# Patient Record
Sex: Female | Born: 1960 | Race: White | Hispanic: No | Smoking: Current every day smoker
Health system: Southern US, Community
[De-identification: ages and names within clinical notes are randomized; demographics above are authoritative.]

## PROBLEM LIST (undated history)

## (undated) DIAGNOSIS — E1140 Type 2 diabetes mellitus with diabetic neuropathy, unspecified: Secondary | ICD-10-CM

## (undated) DIAGNOSIS — R51 Headache: Secondary | ICD-10-CM

## (undated) DIAGNOSIS — M1990 Unspecified osteoarthritis, unspecified site: Secondary | ICD-10-CM

## (undated) DIAGNOSIS — J449 Chronic obstructive pulmonary disease, unspecified: Secondary | ICD-10-CM

## (undated) DIAGNOSIS — F329 Major depressive disorder, single episode, unspecified: Secondary | ICD-10-CM

## (undated) DIAGNOSIS — G4730 Sleep apnea, unspecified: Secondary | ICD-10-CM

## (undated) DIAGNOSIS — K219 Gastro-esophageal reflux disease without esophagitis: Secondary | ICD-10-CM

## (undated) DIAGNOSIS — I2510 Atherosclerotic heart disease of native coronary artery without angina pectoris: Secondary | ICD-10-CM

## (undated) DIAGNOSIS — F419 Anxiety disorder, unspecified: Secondary | ICD-10-CM

## (undated) DIAGNOSIS — R0600 Dyspnea, unspecified: Secondary | ICD-10-CM

## (undated) DIAGNOSIS — F17210 Nicotine dependence, cigarettes, uncomplicated: Secondary | ICD-10-CM

## (undated) DIAGNOSIS — W5911XA Bitten by nonvenomous snake, initial encounter: Secondary | ICD-10-CM

## (undated) DIAGNOSIS — I700 Atherosclerosis of aorta: Secondary | ICD-10-CM

## (undated) DIAGNOSIS — G510 Bell's palsy: Secondary | ICD-10-CM

## (undated) DIAGNOSIS — M549 Dorsalgia, unspecified: Secondary | ICD-10-CM

## (undated) DIAGNOSIS — J189 Pneumonia, unspecified organism: Secondary | ICD-10-CM

## (undated) DIAGNOSIS — J45909 Unspecified asthma, uncomplicated: Secondary | ICD-10-CM

## (undated) DIAGNOSIS — I499 Cardiac arrhythmia, unspecified: Secondary | ICD-10-CM

## (undated) DIAGNOSIS — G2581 Restless legs syndrome: Secondary | ICD-10-CM

## (undated) DIAGNOSIS — D3002 Benign neoplasm of left kidney: Secondary | ICD-10-CM

## (undated) DIAGNOSIS — J84115 Respiratory bronchiolitis interstitial lung disease: Secondary | ICD-10-CM

## (undated) DIAGNOSIS — R519 Headache, unspecified: Secondary | ICD-10-CM

## (undated) DIAGNOSIS — F32A Depression, unspecified: Secondary | ICD-10-CM

## (undated) DIAGNOSIS — E119 Type 2 diabetes mellitus without complications: Secondary | ICD-10-CM

## (undated) DIAGNOSIS — B1920 Unspecified viral hepatitis C without hepatic coma: Secondary | ICD-10-CM

## (undated) DIAGNOSIS — I10 Essential (primary) hypertension: Secondary | ICD-10-CM

## (undated) DIAGNOSIS — K08109 Complete loss of teeth, unspecified cause, unspecified class: Secondary | ICD-10-CM

## (undated) DIAGNOSIS — F319 Bipolar disorder, unspecified: Secondary | ICD-10-CM

## (undated) DIAGNOSIS — F411 Generalized anxiety disorder: Secondary | ICD-10-CM

## (undated) DIAGNOSIS — Z8489 Family history of other specified conditions: Secondary | ICD-10-CM

## (undated) HISTORY — PX: TUBAL LIGATION: SHX77

## (undated) HISTORY — PX: TUMOR REMOVAL: SHX12

---

## 2005-07-05 ENCOUNTER — Emergency Department: Payer: Self-pay | Admitting: Emergency Medicine

## 2005-11-27 ENCOUNTER — Other Ambulatory Visit: Payer: Self-pay

## 2005-11-27 ENCOUNTER — Emergency Department: Payer: Self-pay | Admitting: Emergency Medicine

## 2005-11-27 IMAGING — CR DG CHEST 2V
1 series · 2 of 2 positions shown · non-contrast
Comparison: none

REASON FOR EXAM: Cough
COMMENTS:

PROCEDURE:     DXR - DXR CHEST PA (OR AP) AND LATERAL  - [DATE]  [DATE]
RESULT:     The lung fields are clear. The heart, mediastinal and osseous
structures show no significant abnormalities.

[Series 1: view not recorded · 0.17mm/px · 2 of 2 slices shown]
[im 1/2]
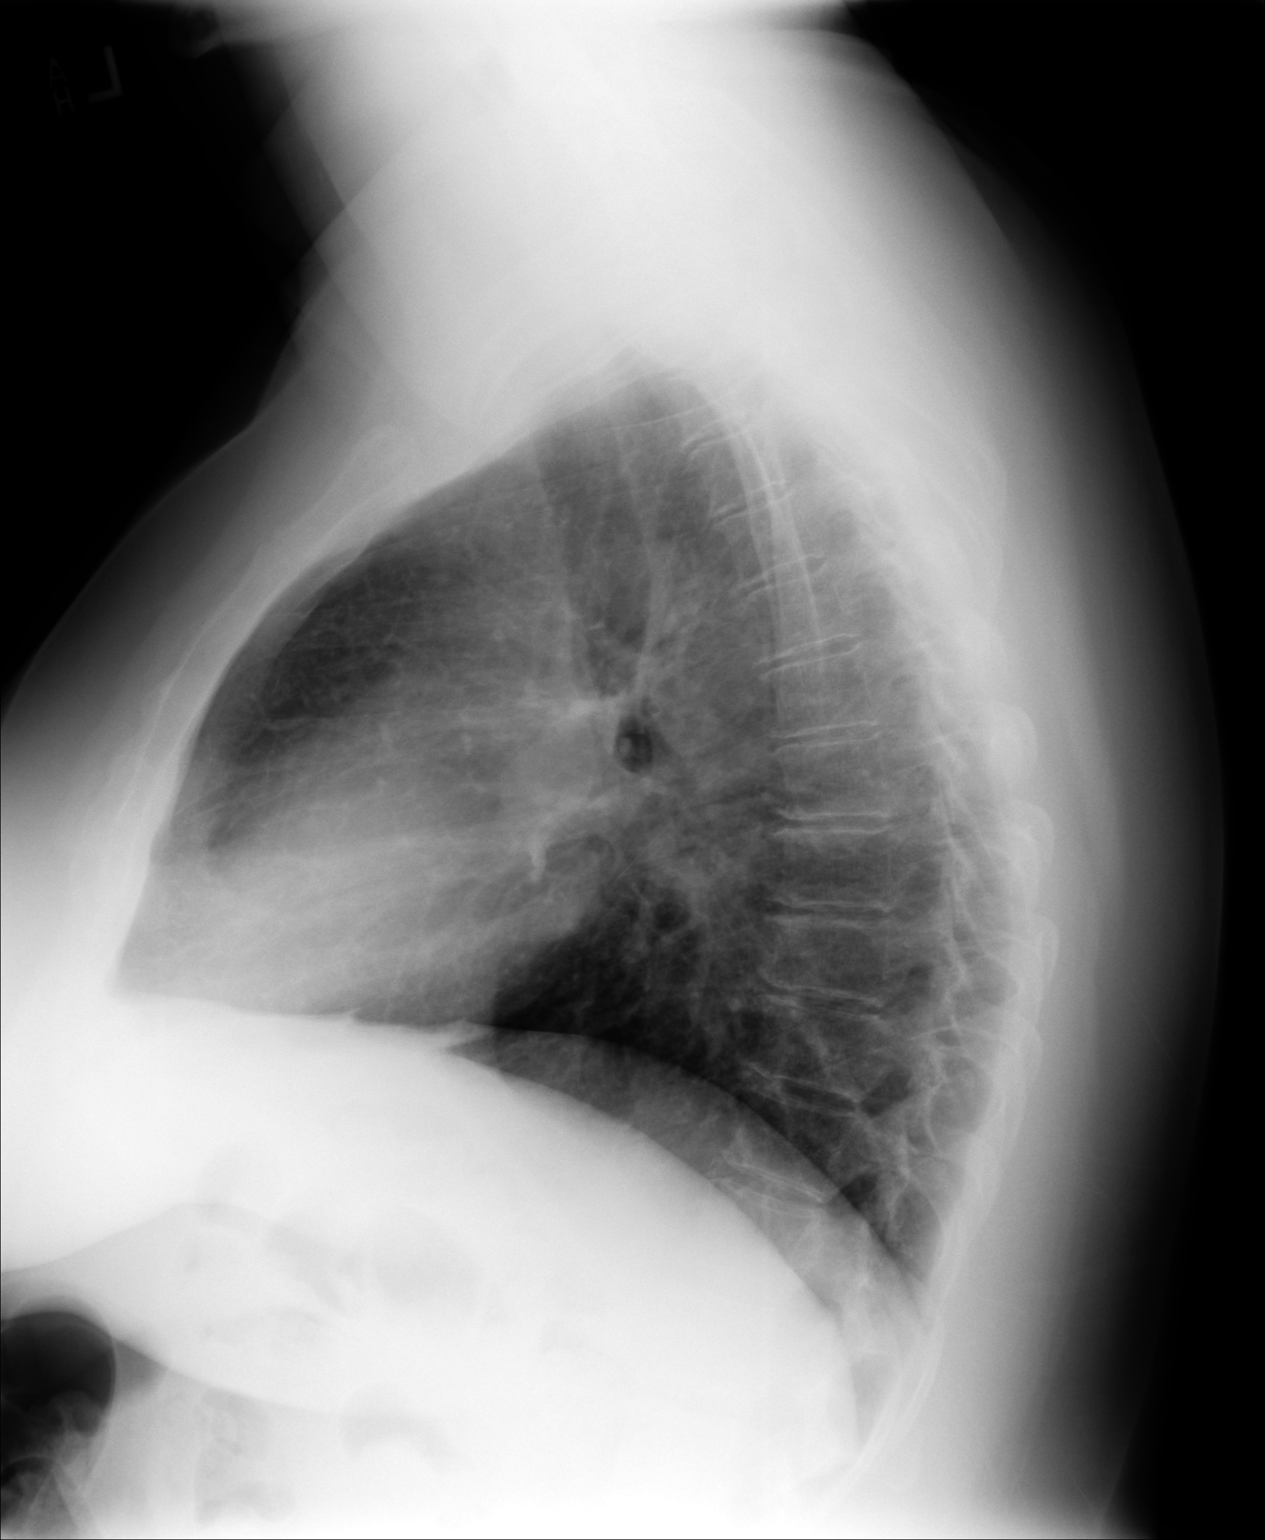
[im 2/2]
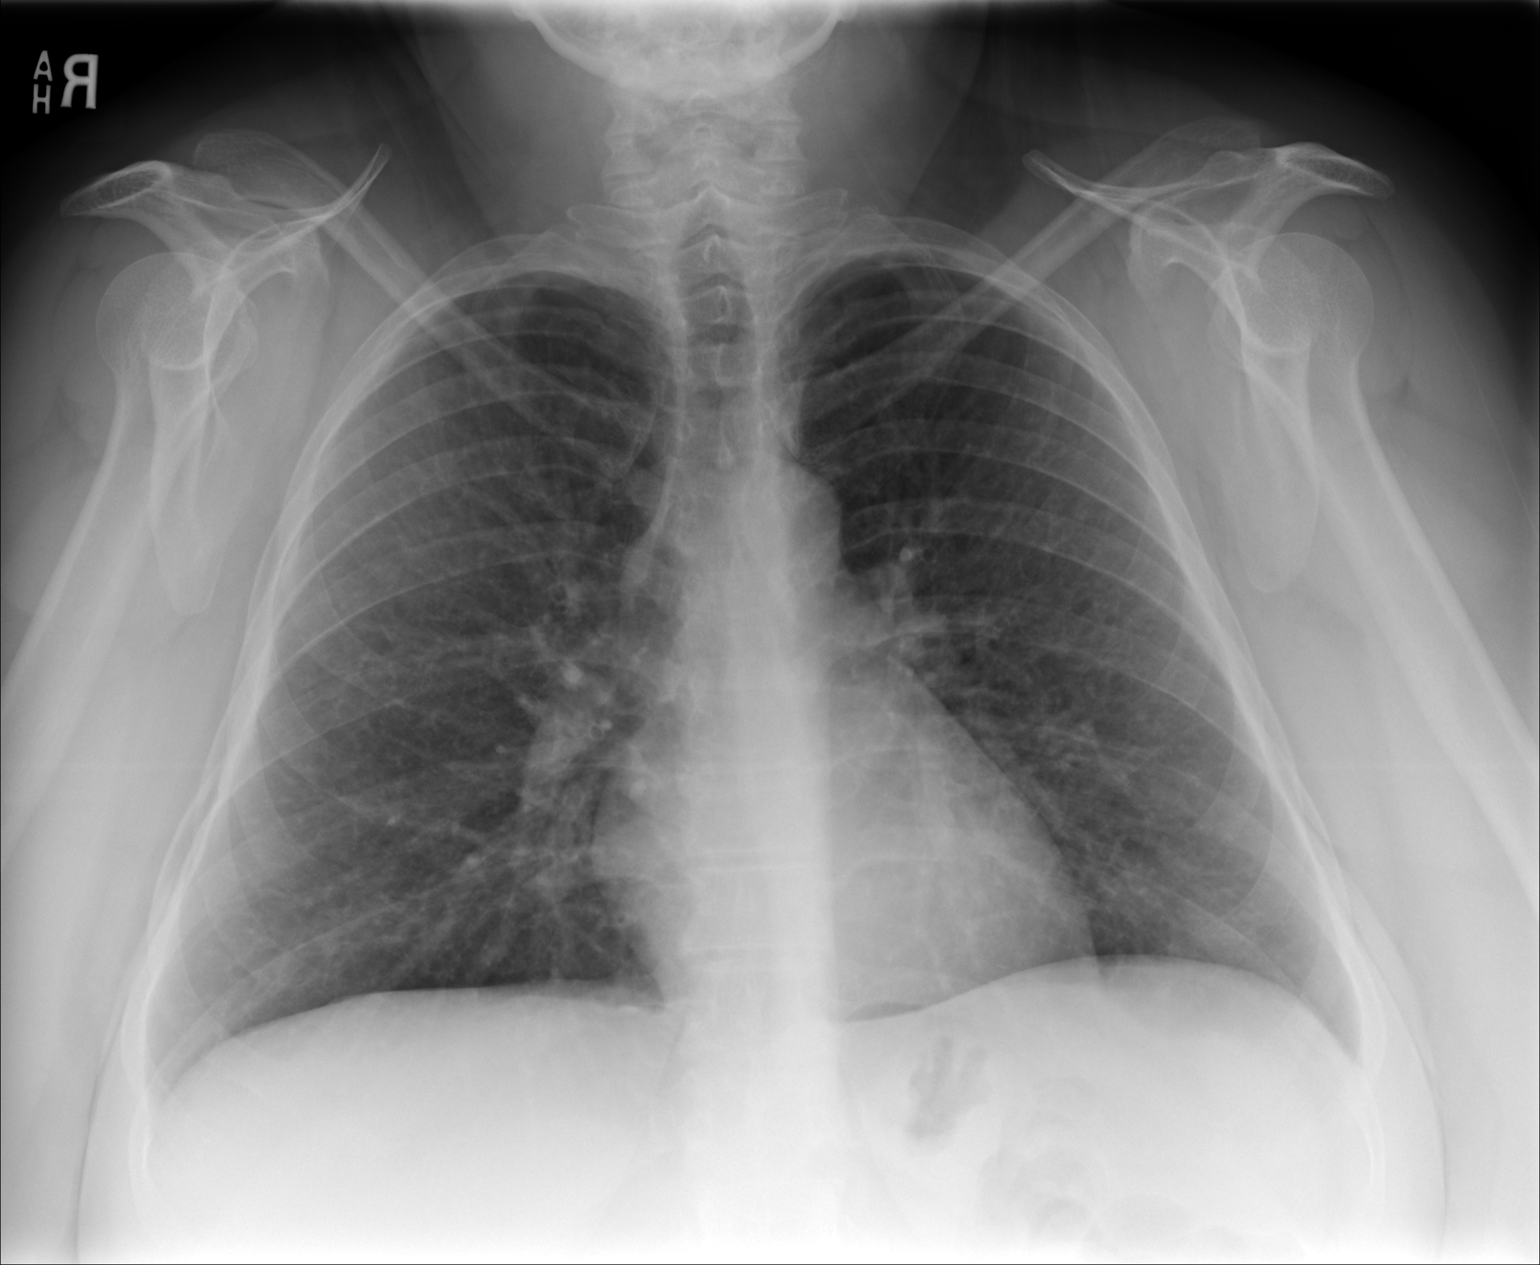

[2 of 2 positions shown; findings below may reference images not displayed]

IMPRESSION: No significant abnormalities are noted.

## 2006-03-18 ENCOUNTER — Emergency Department: Payer: Self-pay | Admitting: Emergency Medicine

## 2006-03-18 IMAGING — CT CT HEAD WITHOUT CONTRAST
2 series · 16 of 30 positions shown, 20 images · non-contrast
Comparison: none

REASON FOR EXAM: Headache
COMMENTS:

[Series 2: without · axial · non-contrast · 0.38mm/px · z∈[+798,+918]mm · 13 of 30 slices shown, 17 images]
[im 3/30  brain]
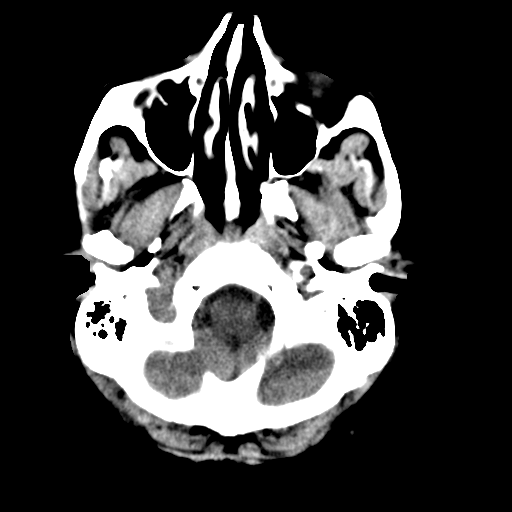
[im 3/30  bone]
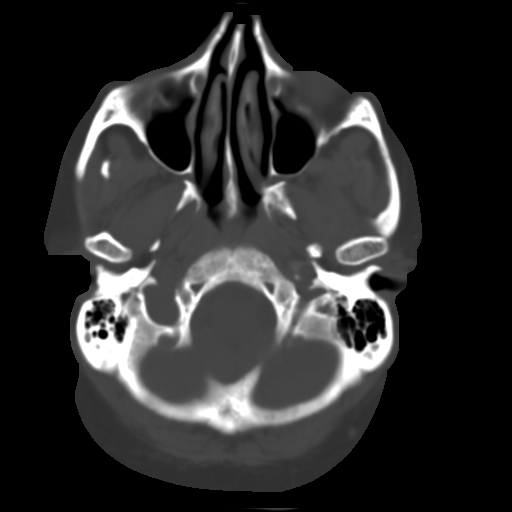
[im 5/30  brain]
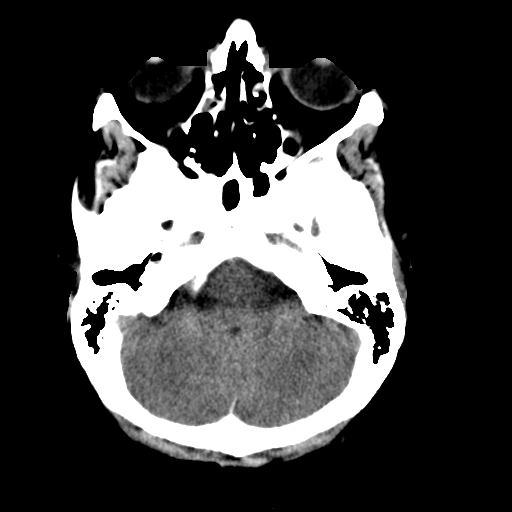
[im 7/30  brain]
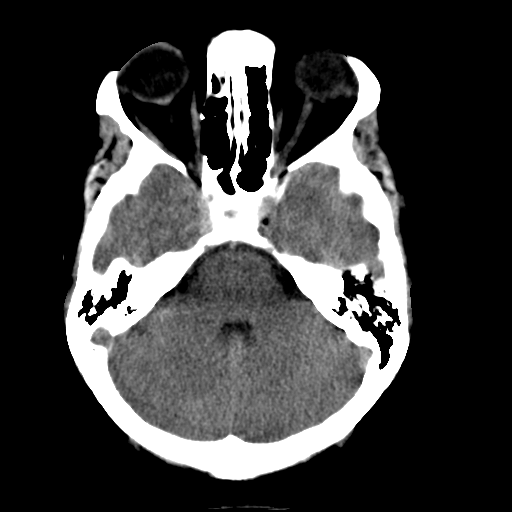
[im 9/30  brain]
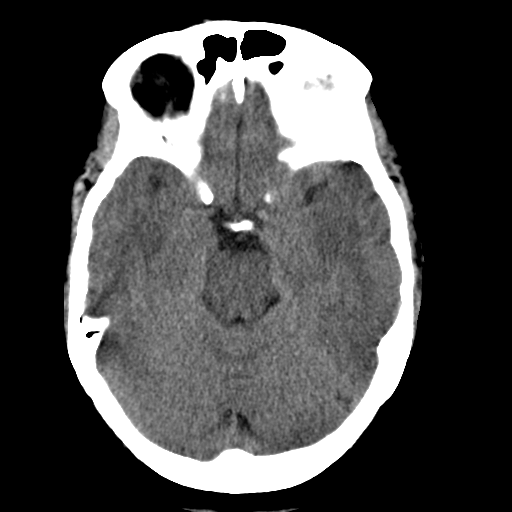
[im 11/30  brain]
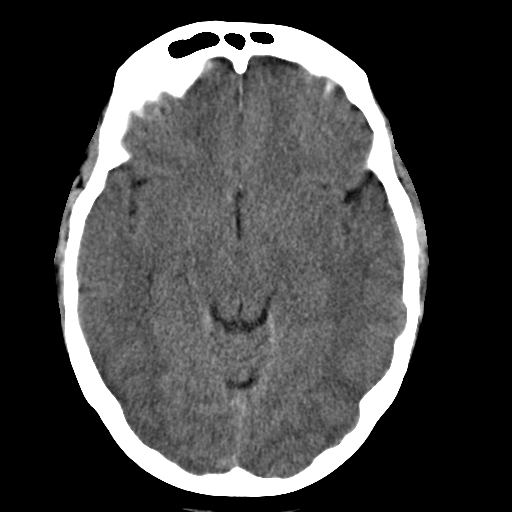
[im 11/30  bone]
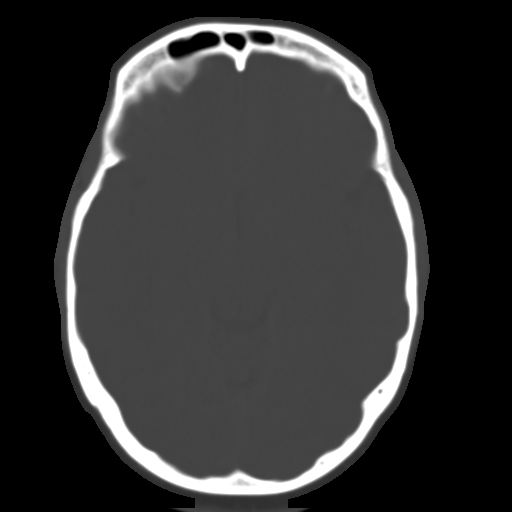
[im 13/30  brain]
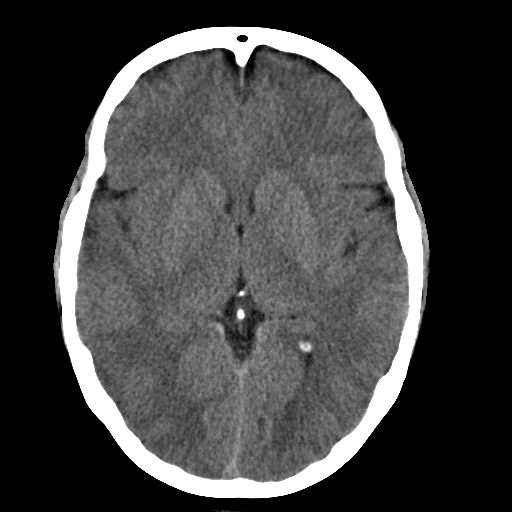
[im 15/30  brain]
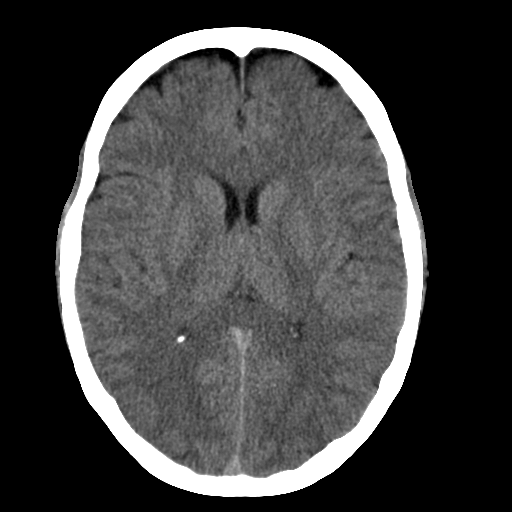
[im 17/30  brain]
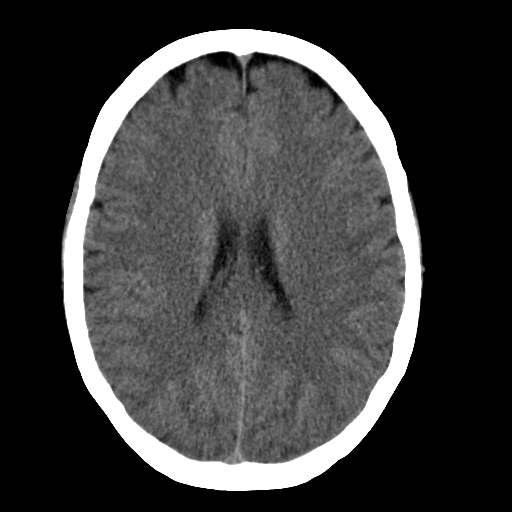
[im 19/30  brain]
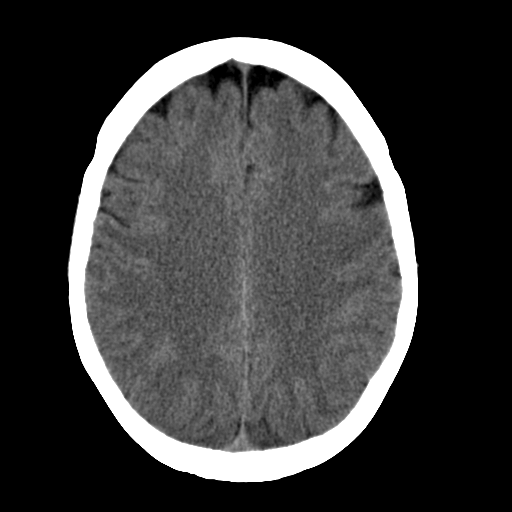
[im 19/30  bone]
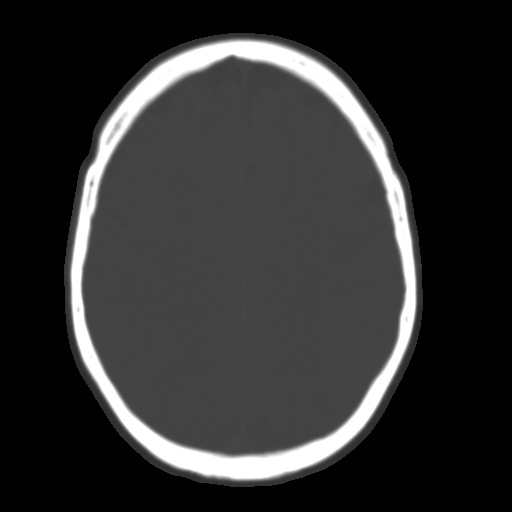
[im 21/30  brain]
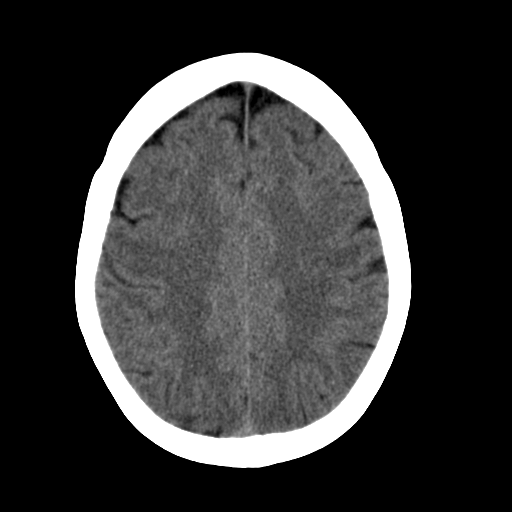
[im 23/30  brain]
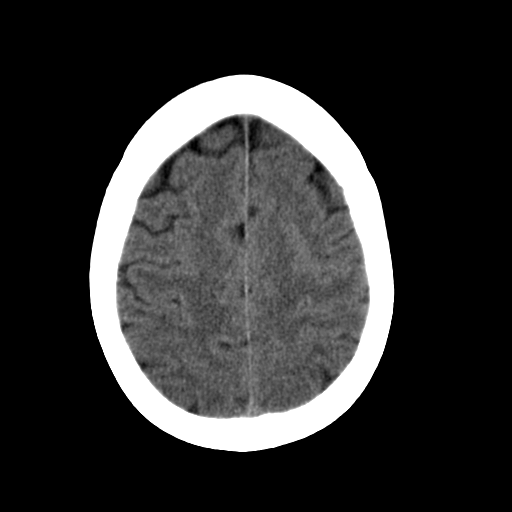
[im 25/30  brain]
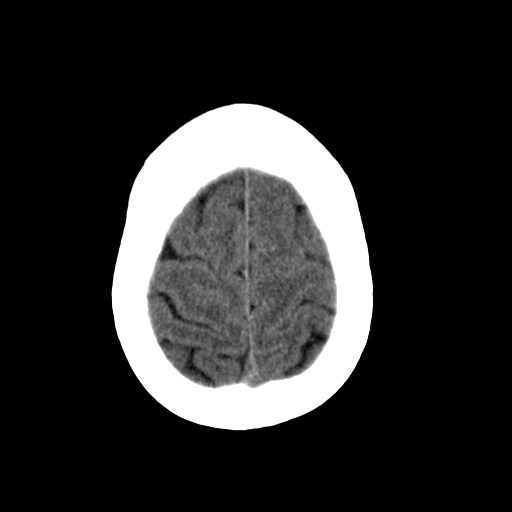
[im 27/30  brain]
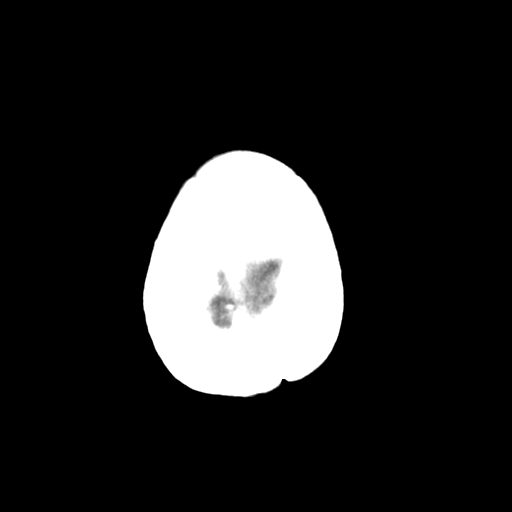
[im 27/30  bone]
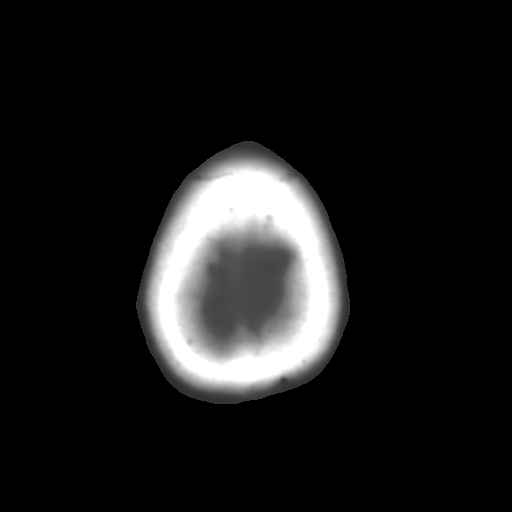

[Series 3: bone · axial · 0.38mm/px · z∈[+798,+838]mm · 3 of 30 slices shown]
[im 3/30  bone]
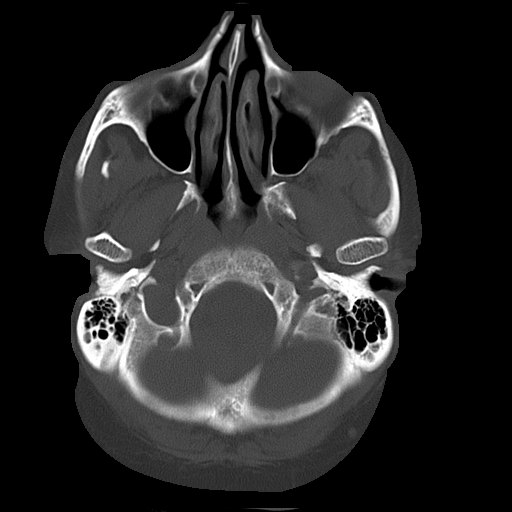
[im 7/30  bone]
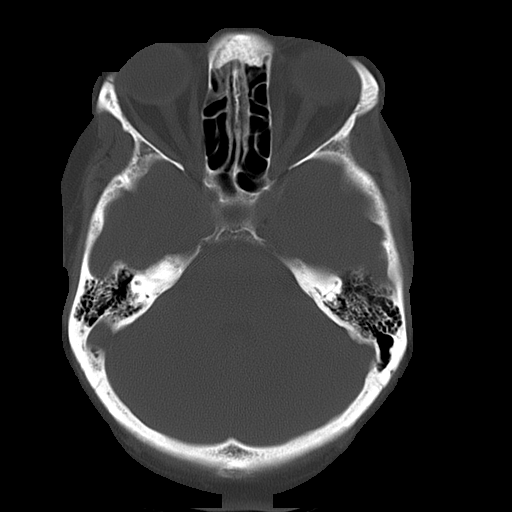
[im 11/30  bone]
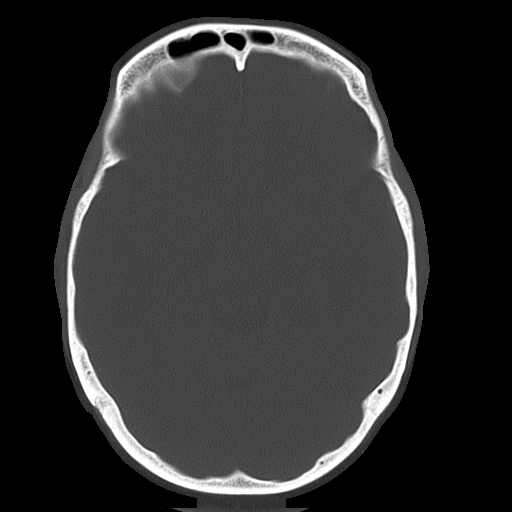

[16 of 30 positions shown; findings below may reference images not displayed]

PROCEDURE:     CT  - CT HEAD WITHOUT CONTRAST  - [DATE]  [DATE]

RESULT:     The ventricles are normal in size and position. There is no
evidence of a mass nor of mass effect. There is no intracranial hemorrhage.
At bone window settings, the visualized portions of the paranasal sinuses
are normal. There is no evidence of skull fracture.
IMPRESSION: I see no acute intracranial abnormality.

The findings were called to the Emergency Room at the conclusion of the
study.

## 2008-03-09 ENCOUNTER — Emergency Department: Payer: Self-pay | Admitting: Emergency Medicine

## 2008-03-09 IMAGING — CR DG ELBOW COMPLETE 3+V*L*
1 series · 4 of 4 positions shown · non-contrast
Comparison: none

REASON FOR EXAM: fall/injury    Minor care 4
COMMENTS:   LMP: Post-Menopausal

PROCEDURE:     DXR - DXR ELBOW LT COMP W/OBLIQUES  - [DATE] [DATE]
RESULT:     Images of the LEFT elbow demonstrate no definite fracture,
dislocation or radiopaque foreign body.

[Series 1: view not recorded · 0.17mm/px · 4 of 4 slices shown]
[im 1/4]
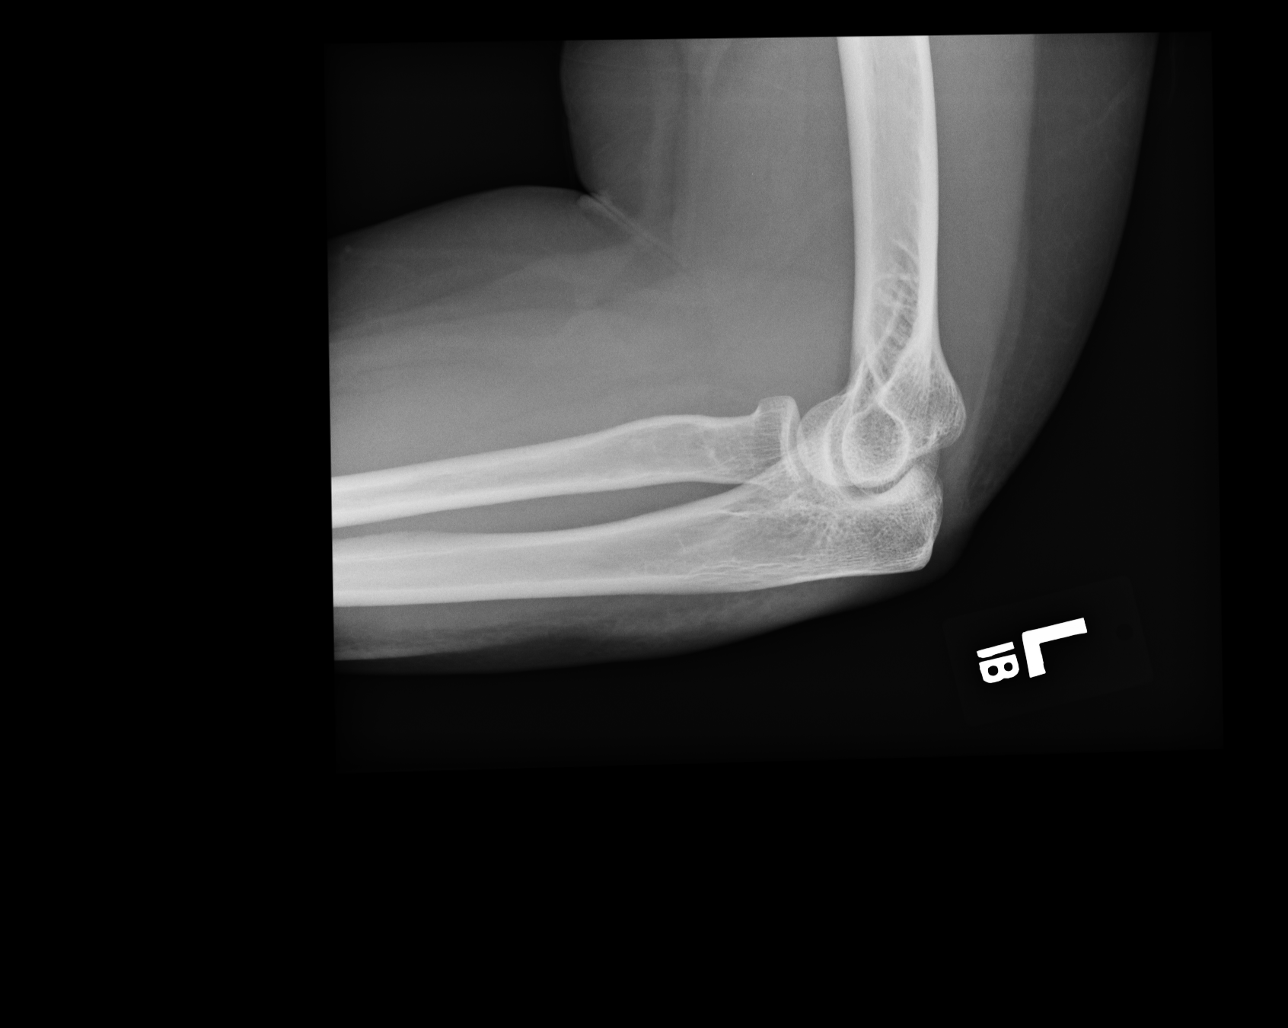
[im 2/4]
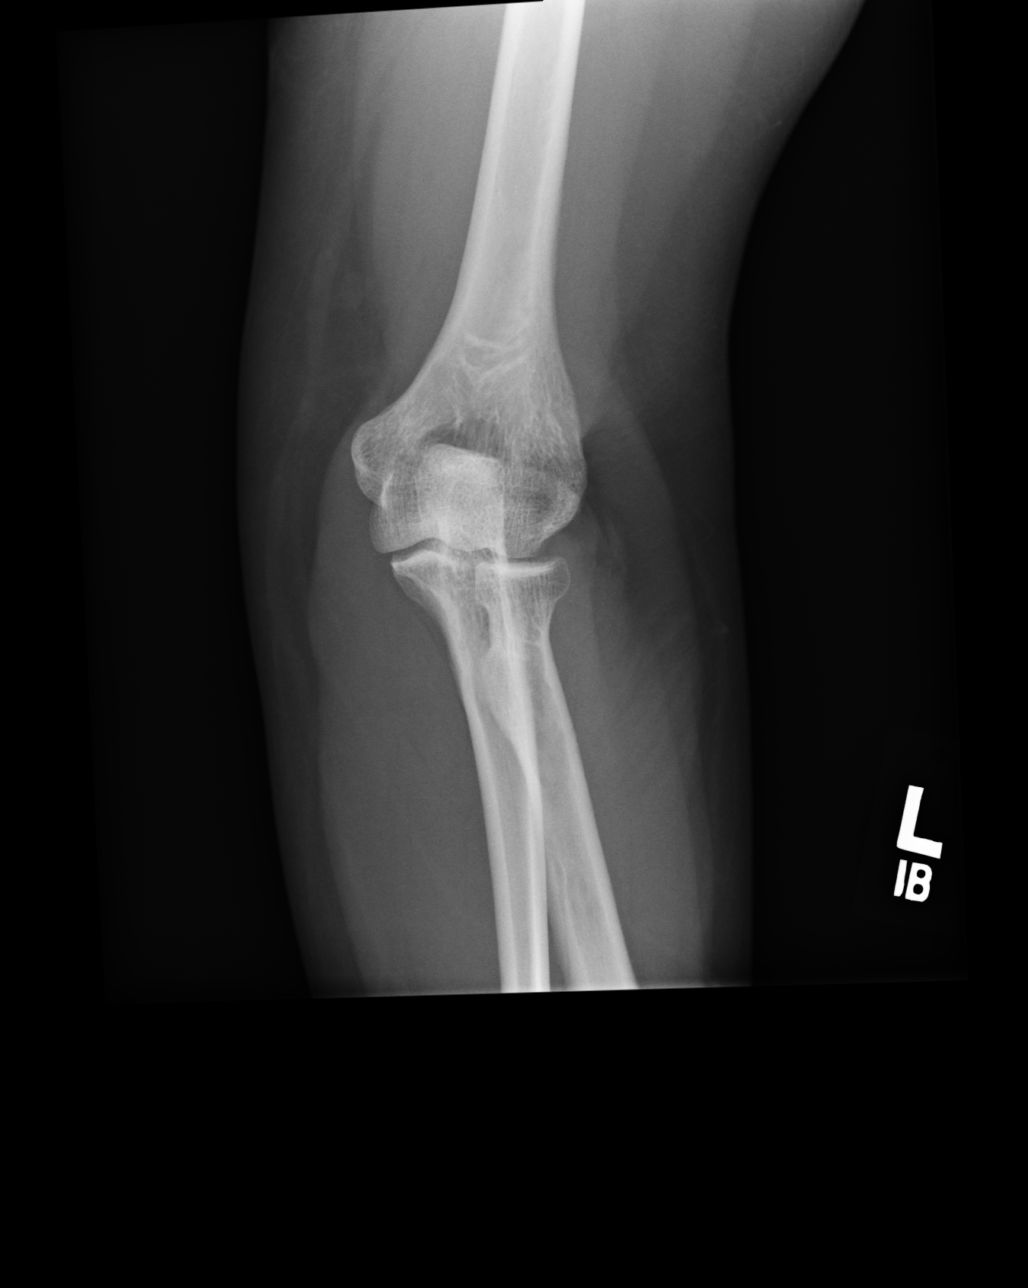
[im 3/4]
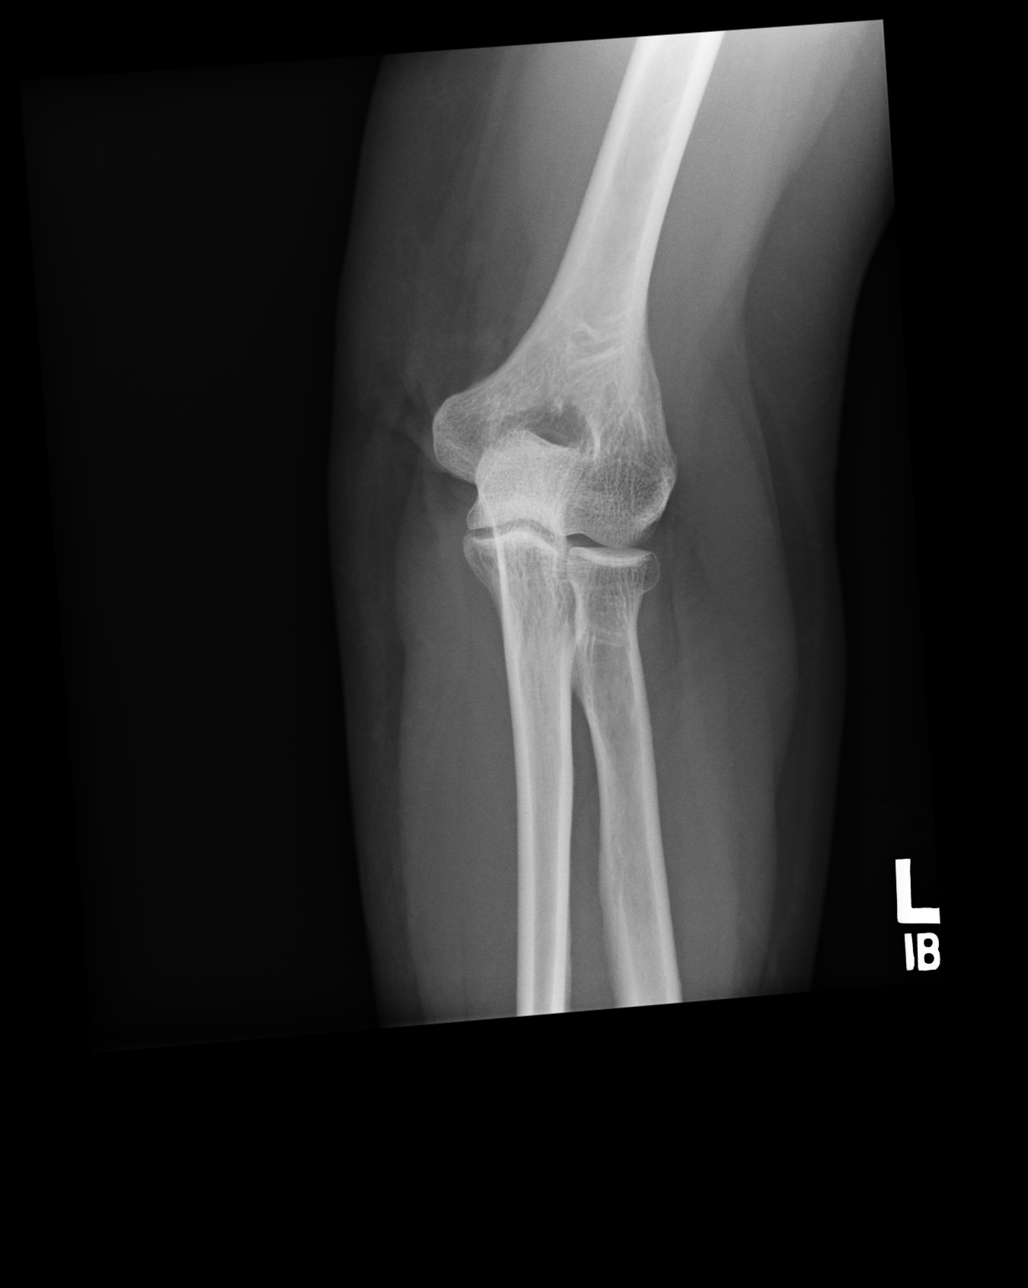
[im 4/4]
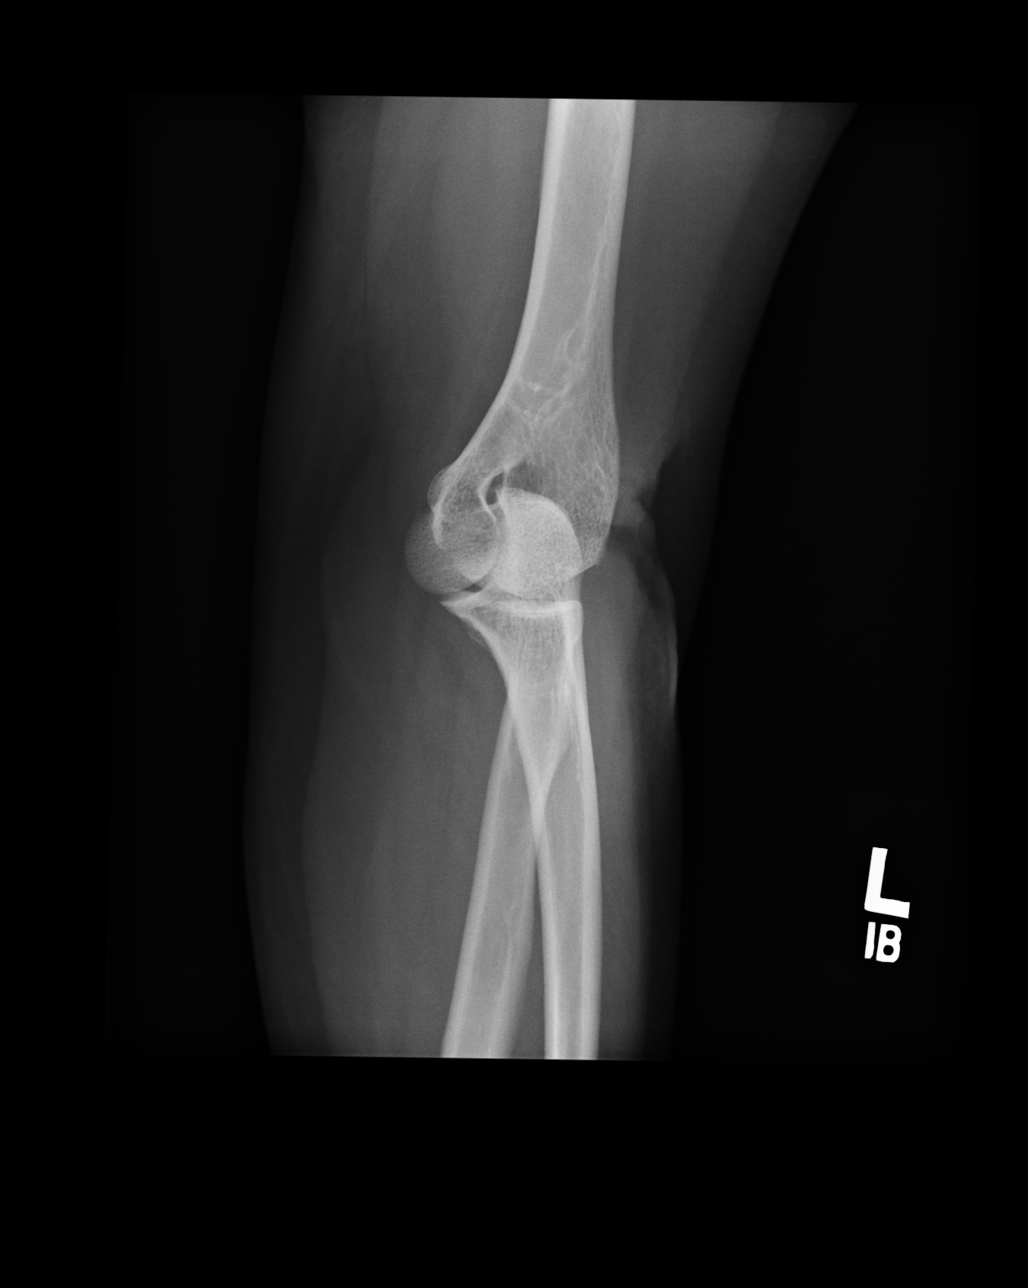

[4 of 4 positions shown; findings below may reference images not displayed]

IMPRESSION: No acute bony abnormality evident.

## 2009-06-27 ENCOUNTER — Emergency Department: Payer: Self-pay | Admitting: Internal Medicine

## 2009-07-07 ENCOUNTER — Emergency Department: Payer: Self-pay | Admitting: Emergency Medicine

## 2009-08-07 ENCOUNTER — Emergency Department: Payer: Self-pay | Admitting: Emergency Medicine

## 2009-12-27 ENCOUNTER — Emergency Department: Payer: Self-pay | Admitting: Emergency Medicine

## 2010-06-10 ENCOUNTER — Ambulatory Visit: Payer: Self-pay | Admitting: Family Medicine

## 2010-06-10 IMAGING — US ABDOMEN ULTRASOUND LIMITED
1 series · 17 of 25 positions shown · non-contrast
Comparison: none

REASON FOR EXAM: ACUTE RUQ PAIN EVAL FOR LIVER GALLBLADDER AND PANCREAS
COMMENTS:

[Series 1: abdomen ultrasound limited · 17 of 62 slices shown]
[im 1/62]
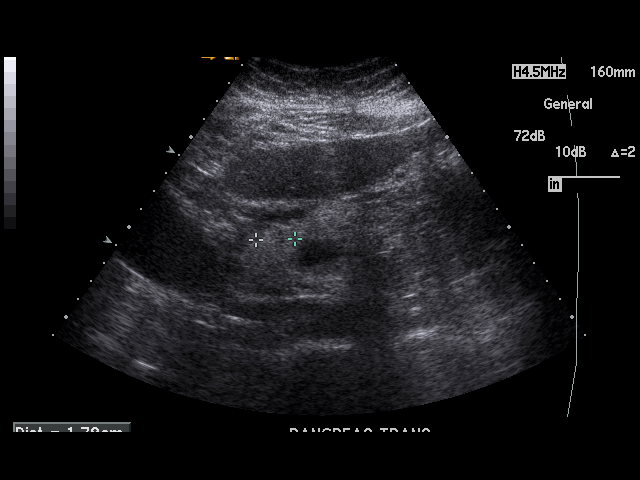
[im 6/62]
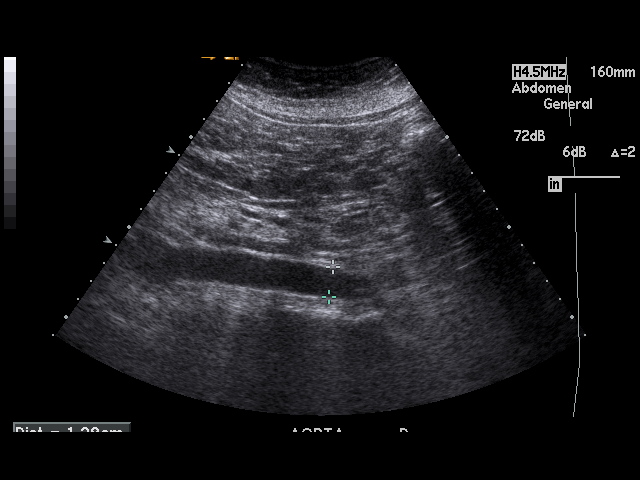
[im 8/62]
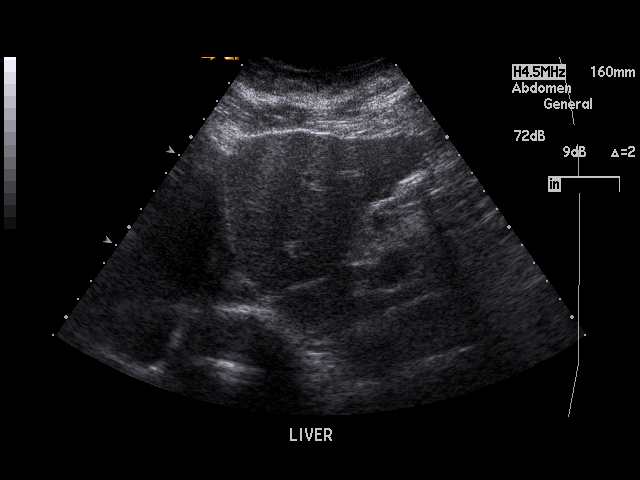
[im 13/62]
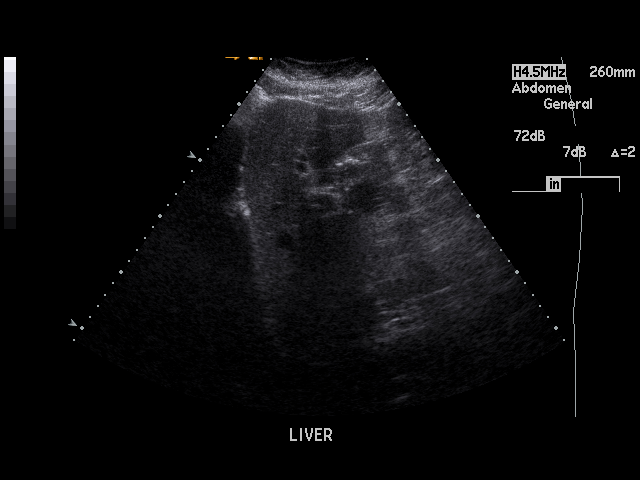
[im 16/62]
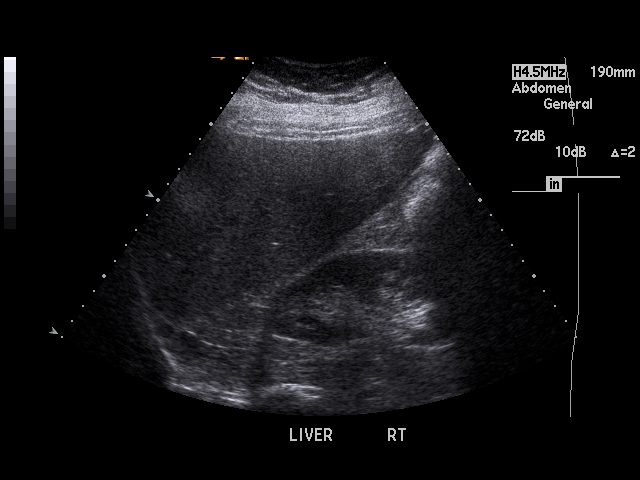
[im 21/62]
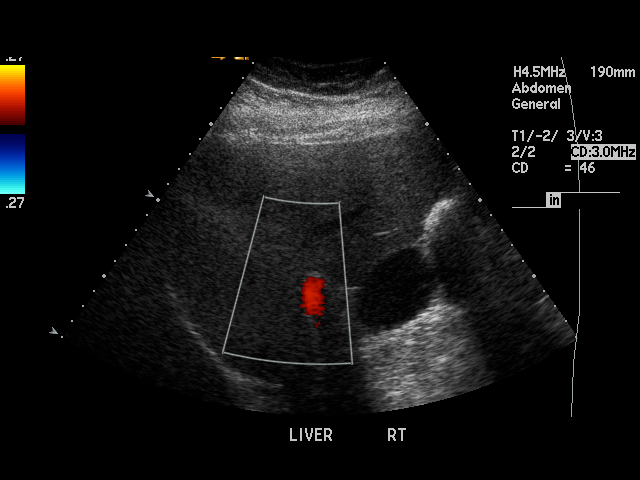
[im 23/62]
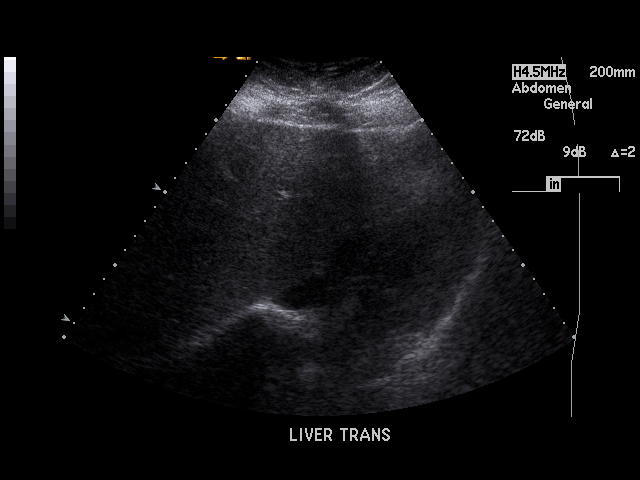
[im 28/62]
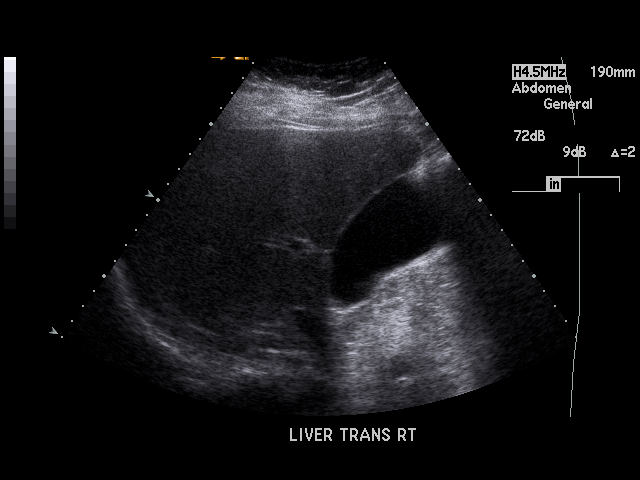
[im 31/62]
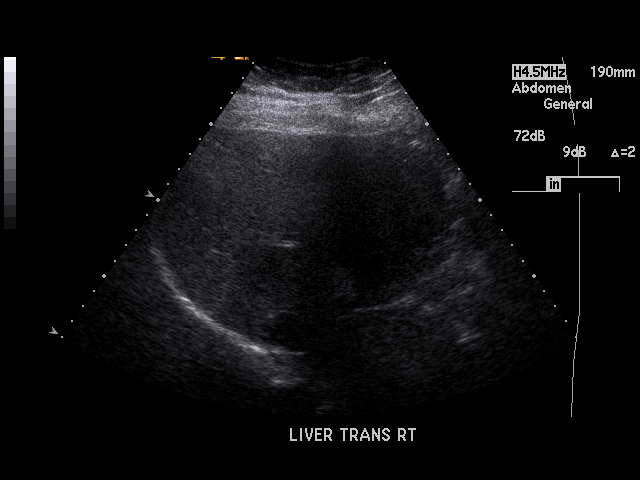
[im 34/62]
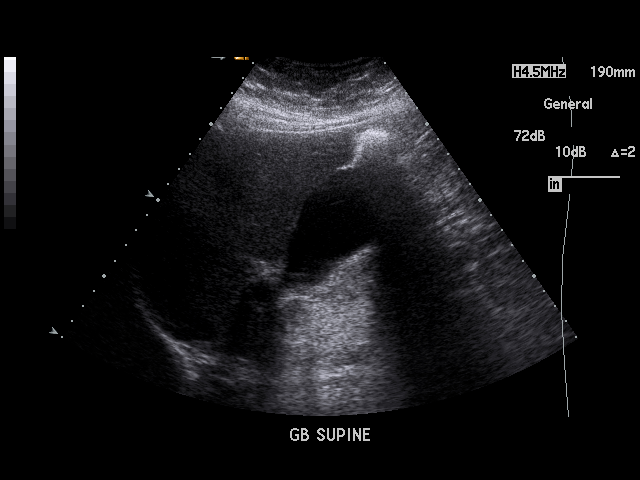
[im 39/62]
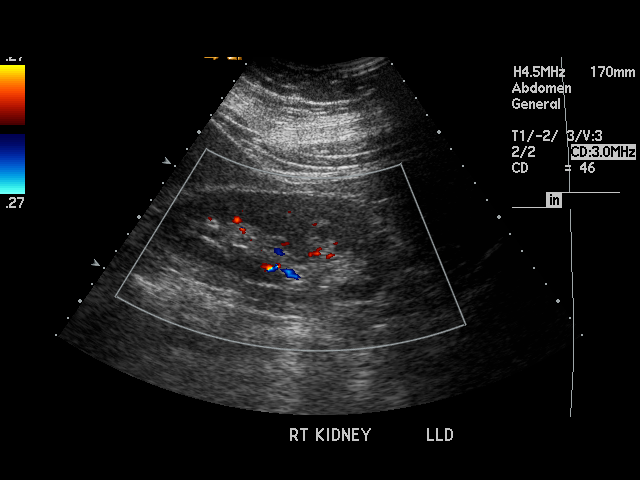
[im 41/62]
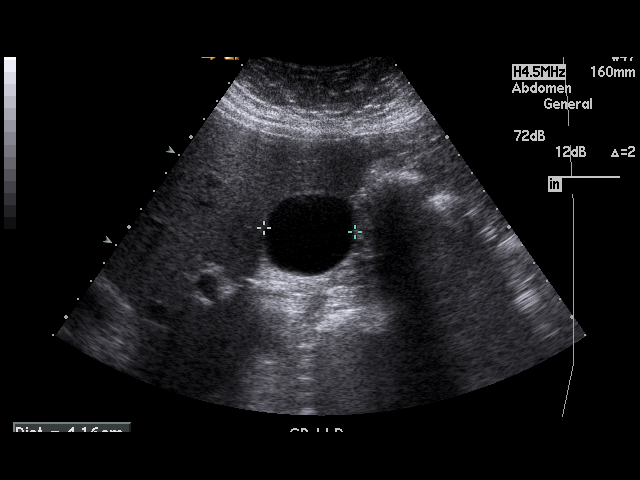
[im 46/62]
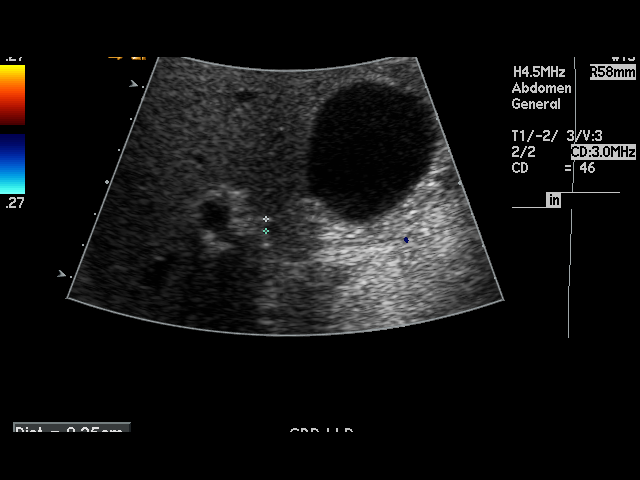
[im 49/62]
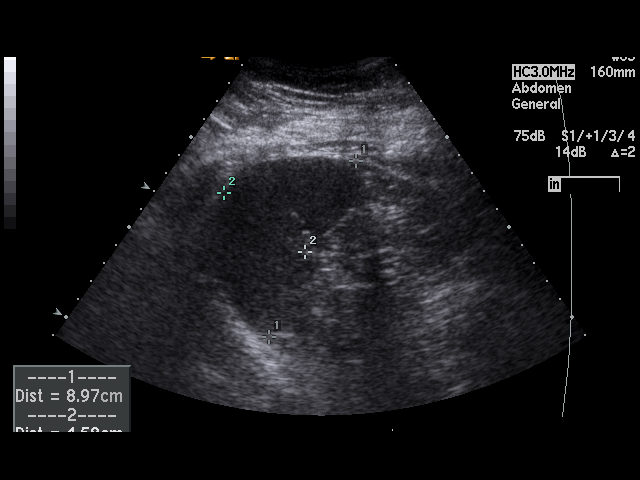
[im 54/62]
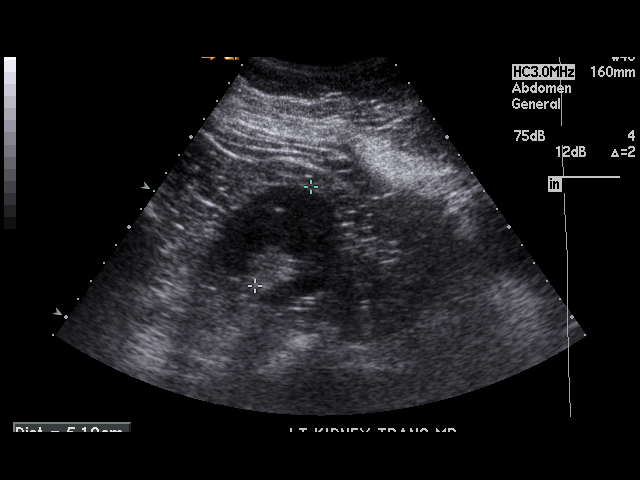
[im 56/62]
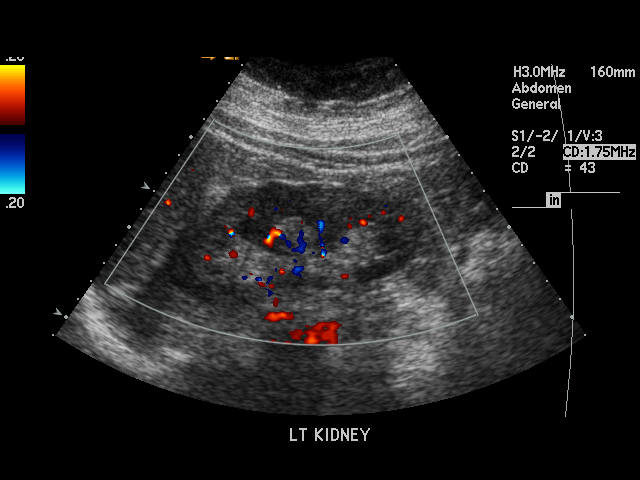
[im 62/62]
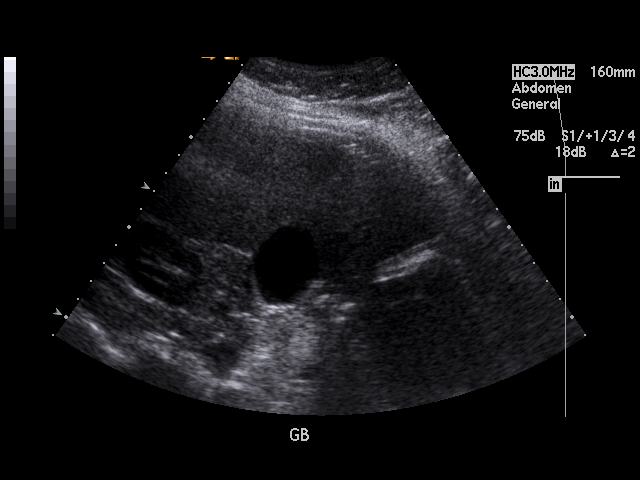

[17 of 25 positions shown; findings below may reference images not displayed]

PROCEDURE:     JOSHJAX - JOSHJAX ABDOMEN UPPER GENERAL  - [DATE] [DATE]

RESULT:     The liver is mildly hyperechogenic, suspicious for fatty
infiltration. No focal hepatic mass lesions are seen. Portal vein flow is
hepatopetal. The spleen is normal in size. A portion of the pancreatic tail
is obscured but otherwise the pancreas is normal in appearance. The
abdominal aorta and inferior vena cava show no significant abnormalities.
There is echogenic material in the gallbladder compatible with a small
amount of sludge. No gallstones are identified. There is no thickening of
the gallbladder wall which measures 2.3 mm in thickness. The common bile
duct measures 3.5 mm at maximum diameter which is within normal limits. The
kidneys show no hydronephrosis. There is no ascites.
IMPRESSION: 1. No acute changes are identified.
2. The pancreas is partially obscured by bowel gas.

## 2010-09-20 ENCOUNTER — Emergency Department: Payer: Self-pay | Admitting: Emergency Medicine

## 2010-09-20 IMAGING — CR CERVICAL SPINE - COMPLETE 4+ VIEW
1 series · 8 of 8 positions shown · non-contrast
Comparison: none

REASON FOR EXAM: pain
COMMENTS:   May transport without cardiac monitor

PROCEDURE:     DXR - DXR CERVICAL SPINE COMPLETE  - [DATE]  [DATE]
RESULT:     The spinal alignment appears to be maintained. The prevertebral
soft tissues appear to be normal. The atlantoaxial alignment is normal. No
fracture is visualized.

[Series 1: view not recorded · 0.17mm/px · 8 of 8 slices shown]
[im 1/8]
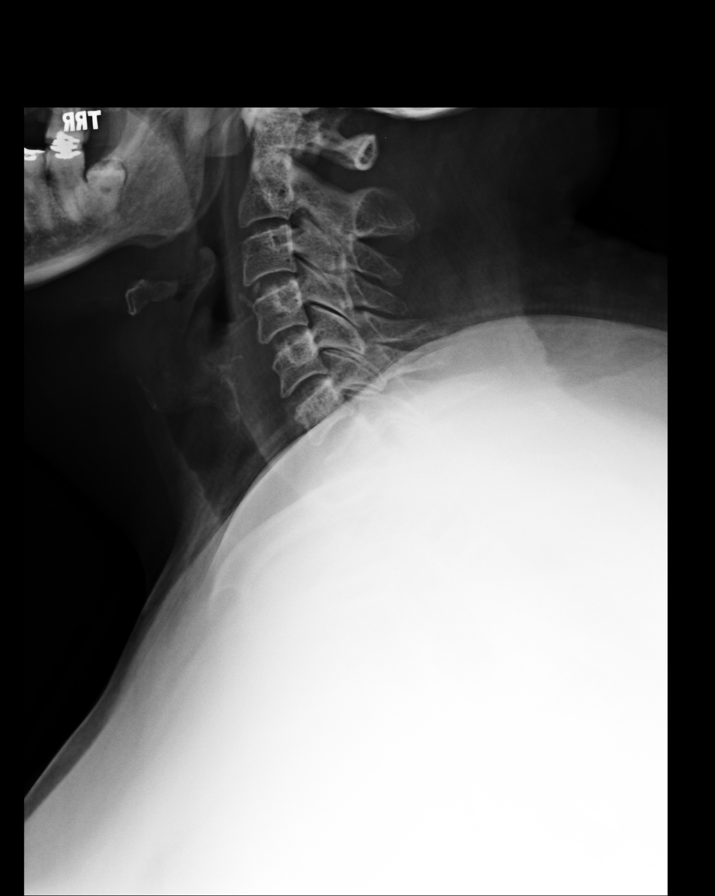
[im 2/8]
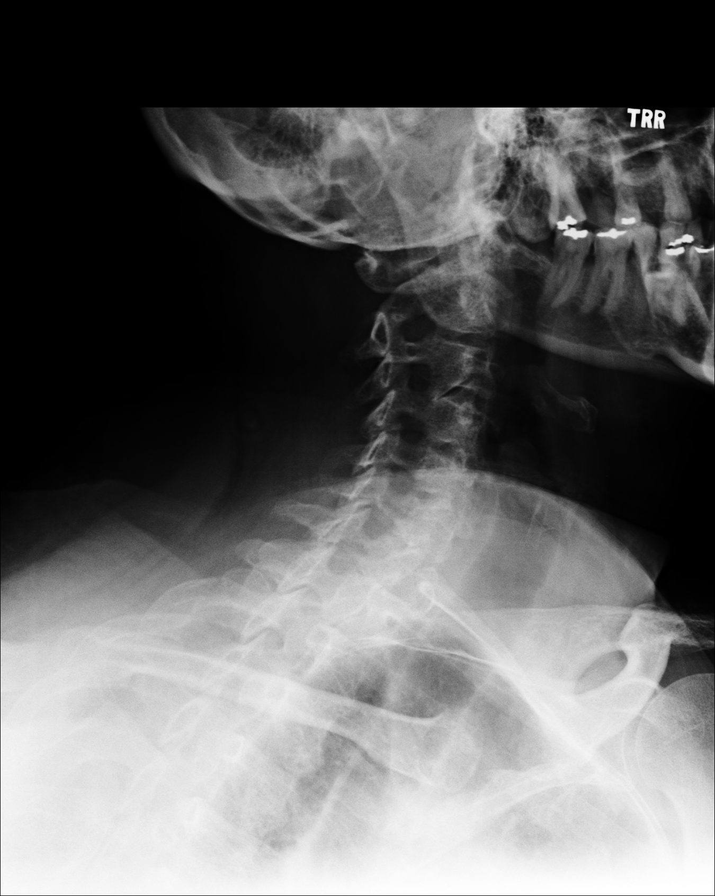
[im 3/8]
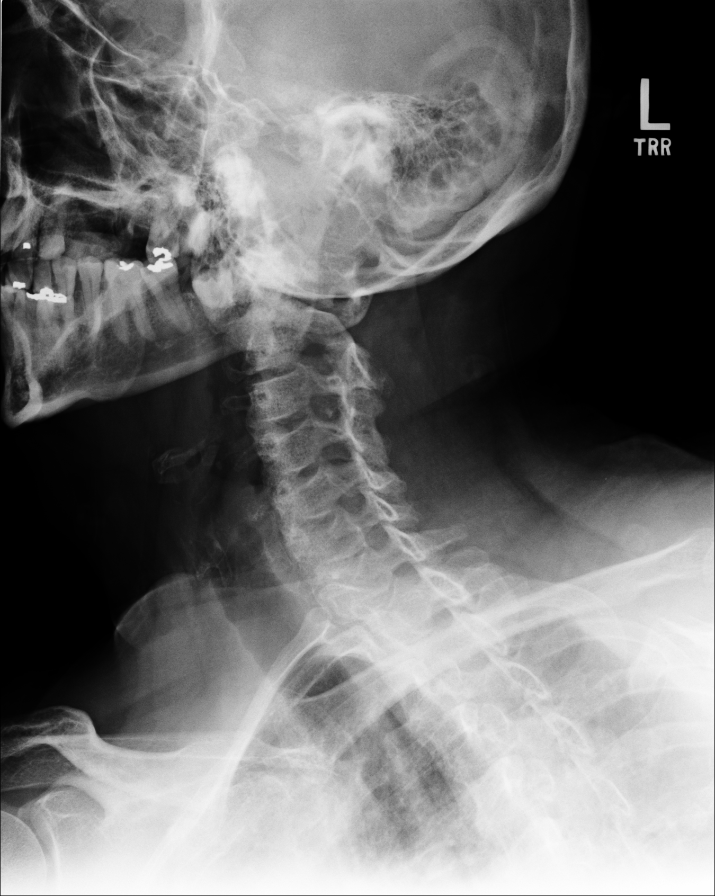
[im 4/8]
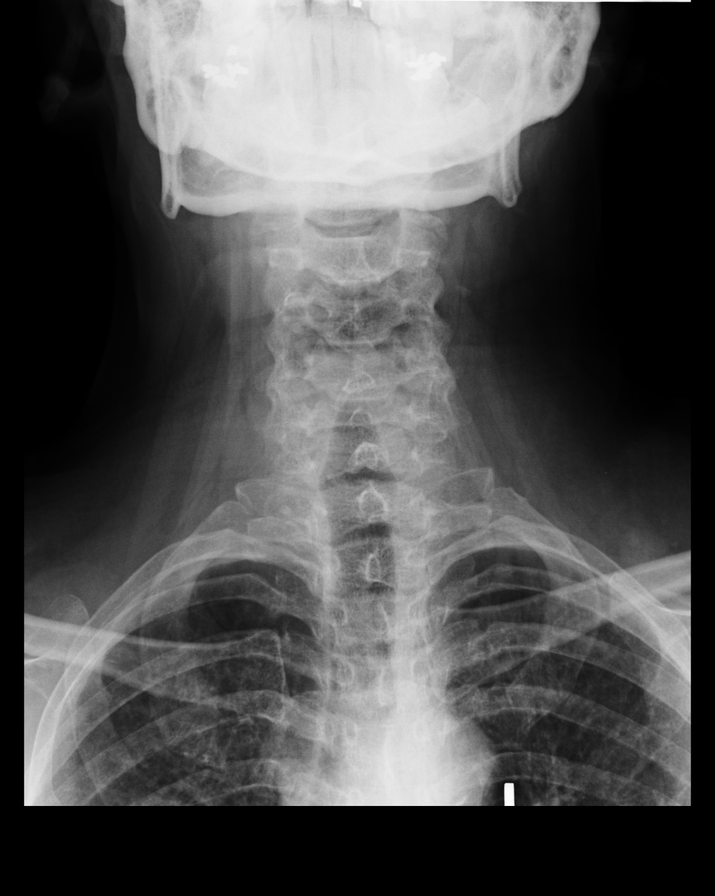
[im 5/8]
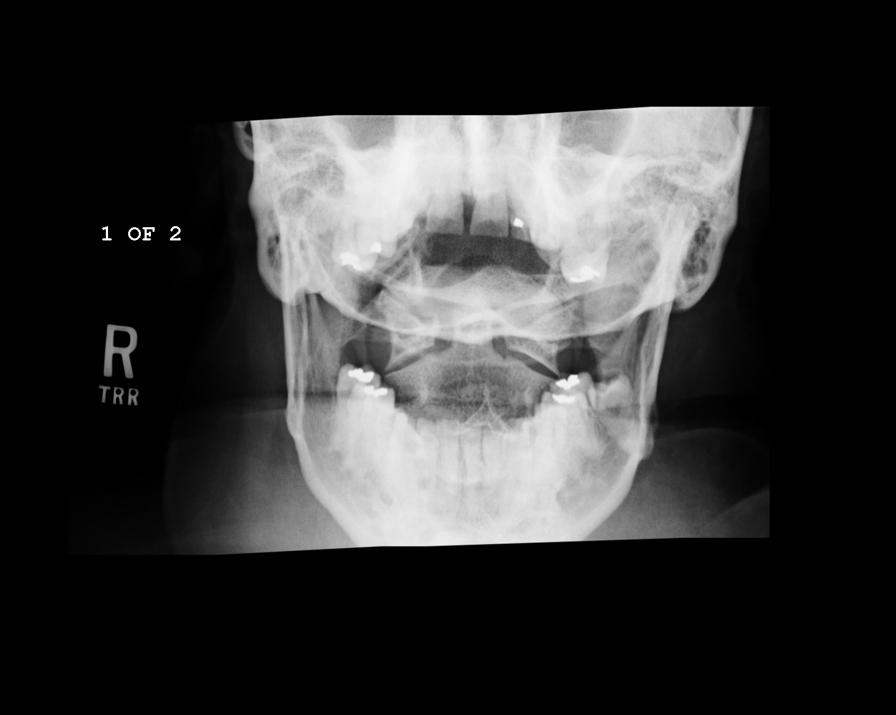
[im 6/8]
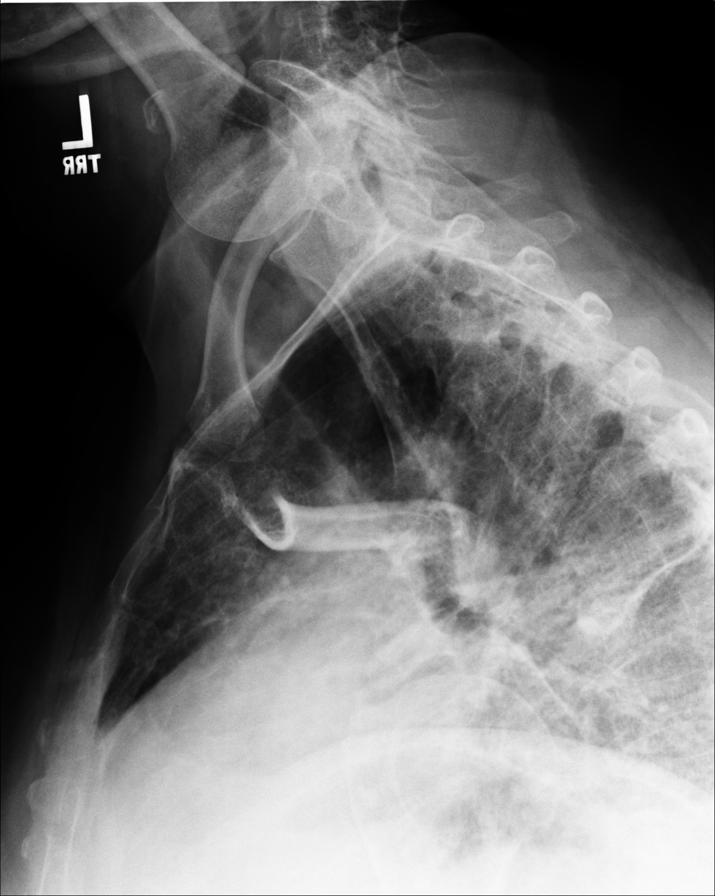
[im 7/8]
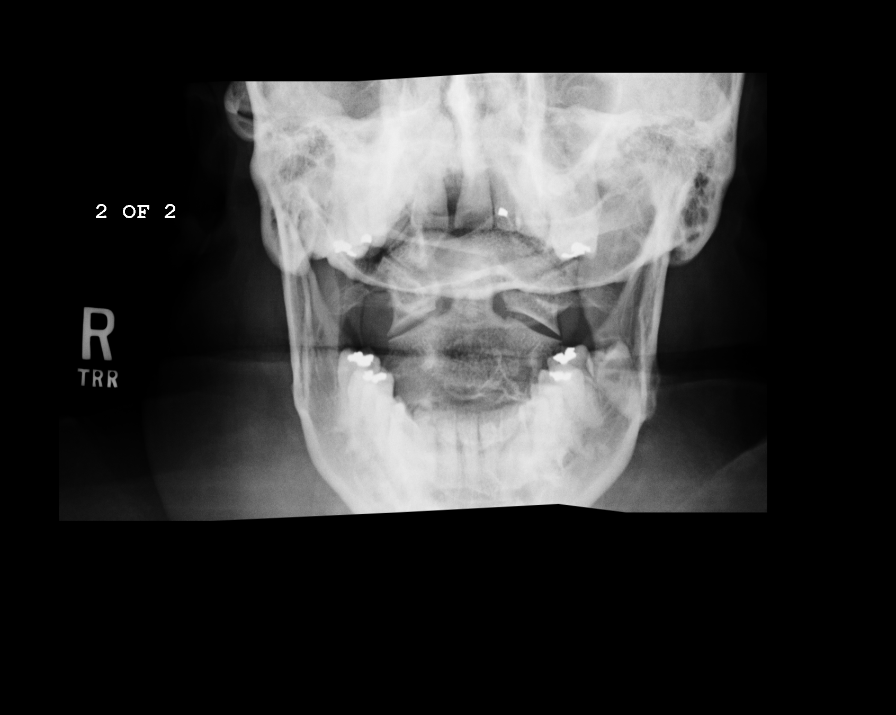
[im 8/8]
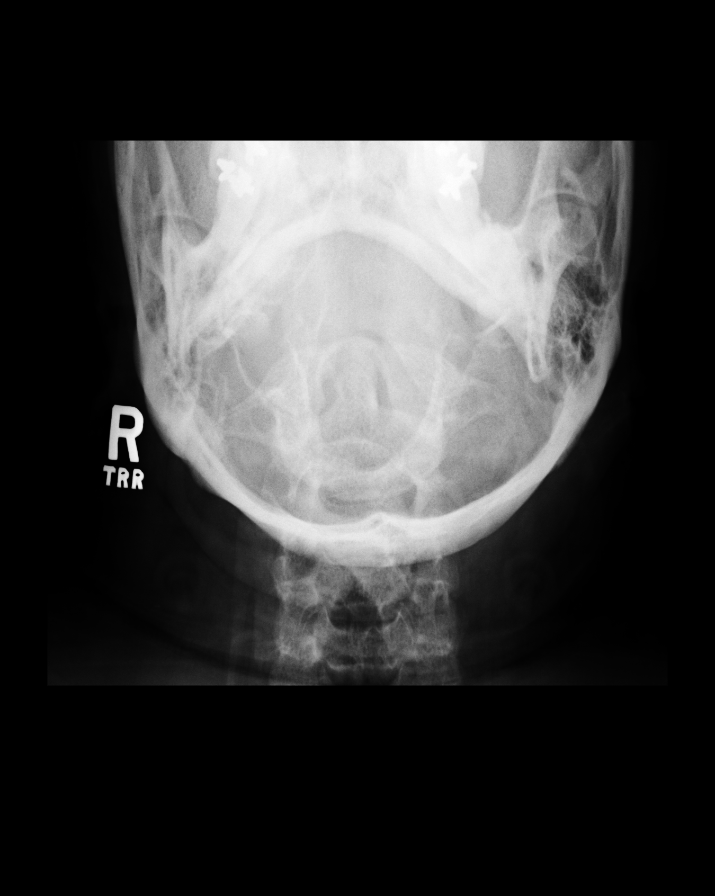

[8 of 8 positions shown; findings below may reference images not displayed]

IMPRESSION: No acute bony abnormality evident.

## 2010-09-20 IMAGING — CR DG LUMBAR SPINE 2-3V
1 series · 3 of 3 positions shown · non-contrast
Comparison: none

REASON FOR EXAM: pain
COMMENTS:   May transport without cardiac monitor

[Series 1: view not recorded · 0.17mm/px · 3 of 3 slices shown]
[im 1/3]
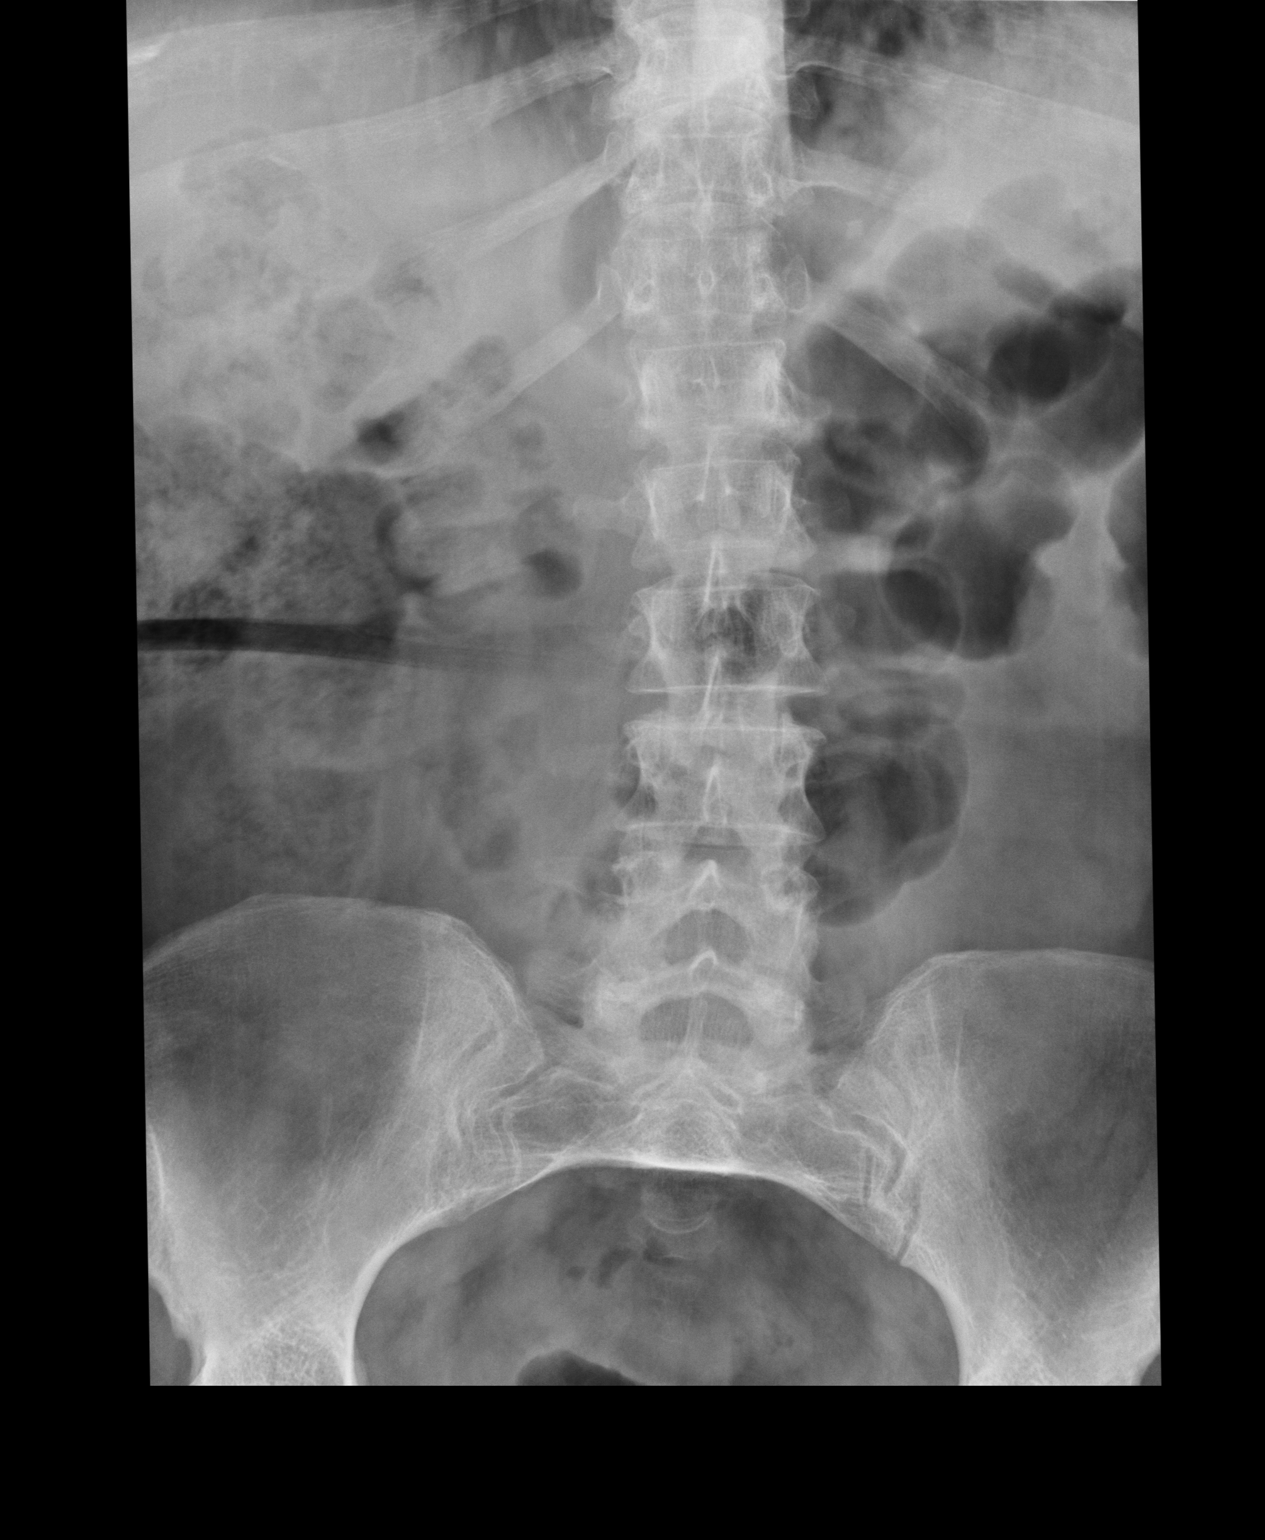
[im 2/3]
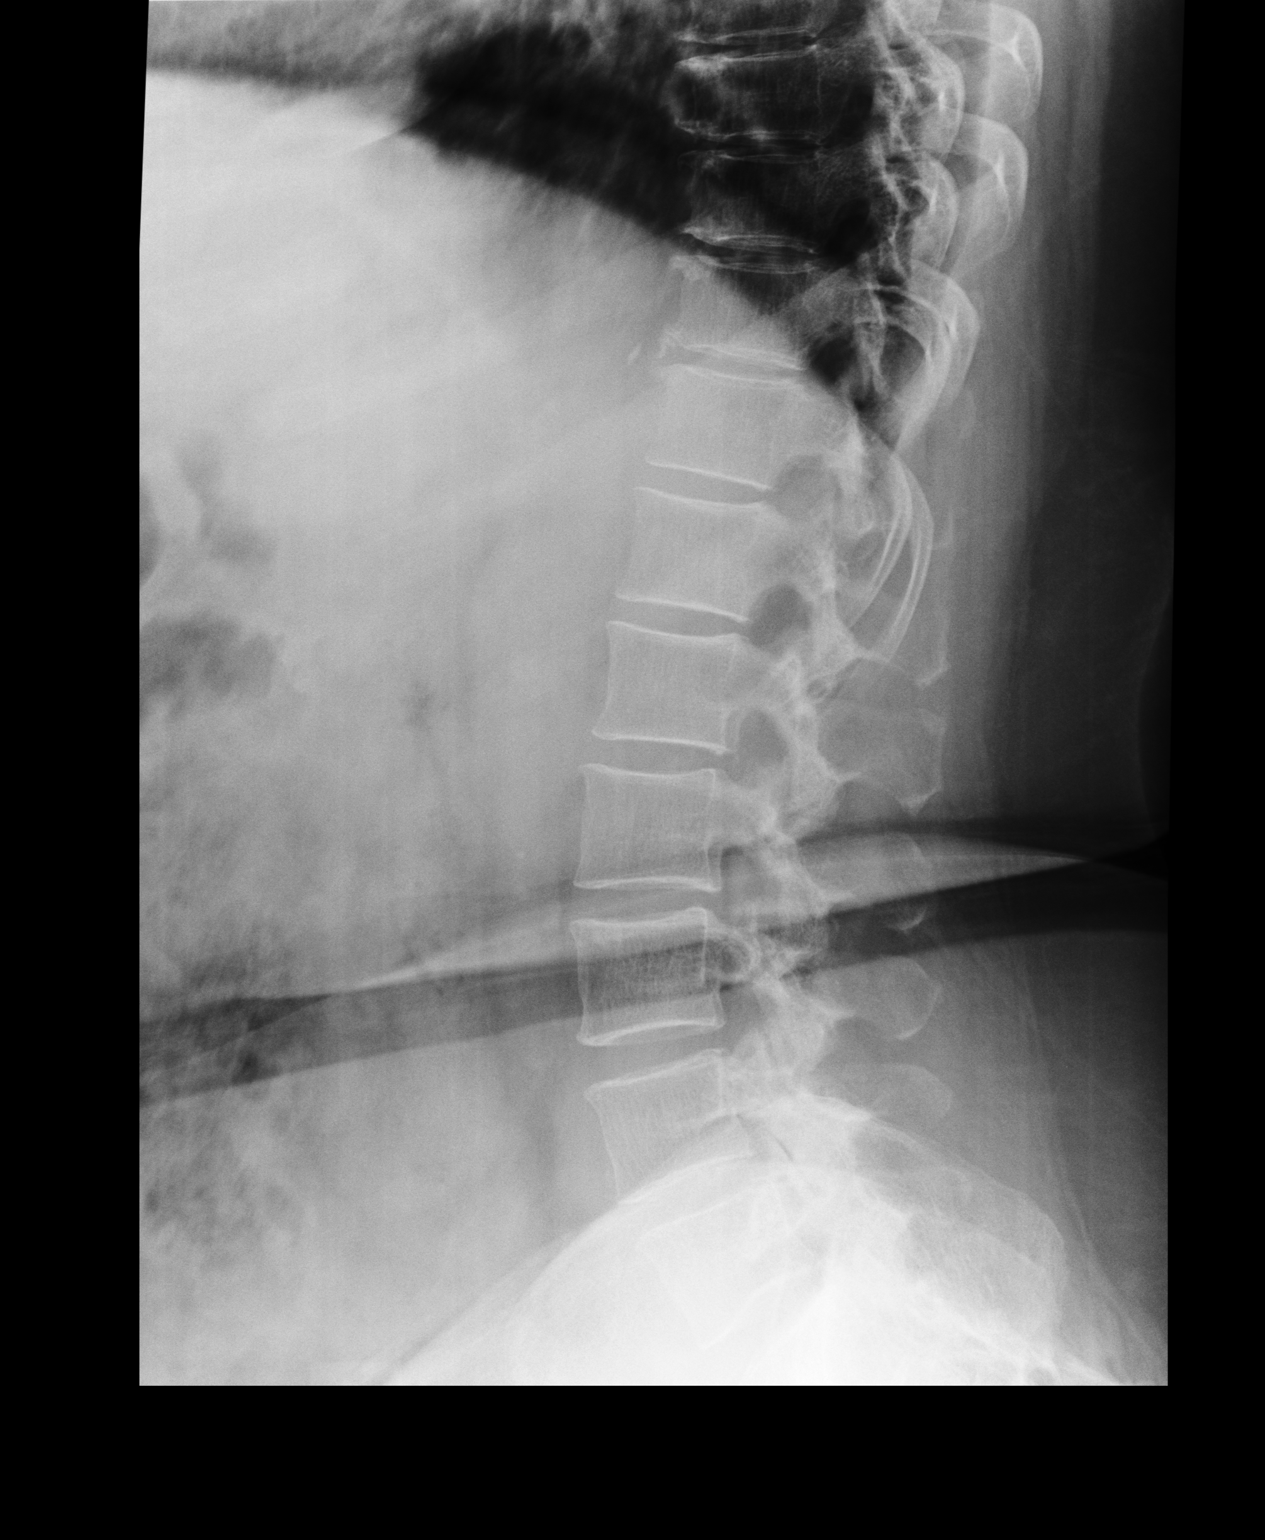
[im 3/3]
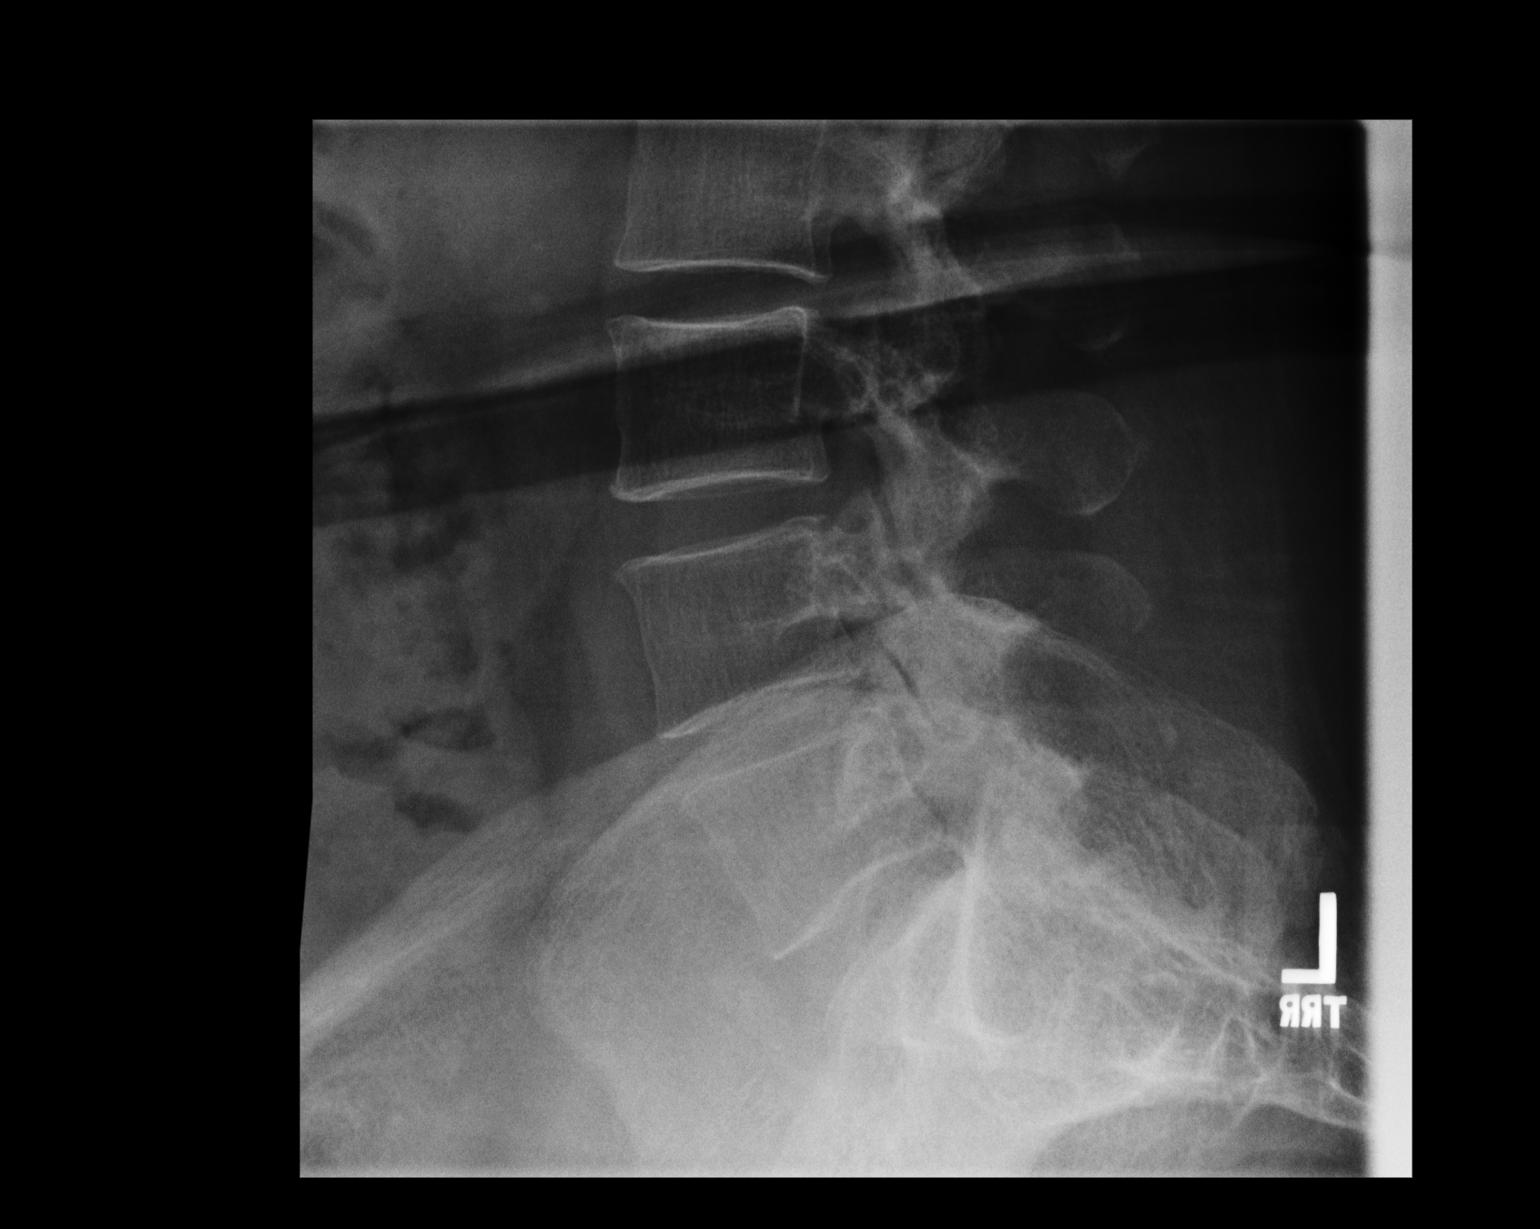

[3 of 3 positions shown; findings below may reference images not displayed]

PROCEDURE:     DXR - DXR LUMBAR SPINE AP AND LATERAL  - [DATE]  [DATE]

RESULT:     Images of the lumbar spine demonstrate the vertebral body
heights and intervertebral disc spaces appear to be maintained. There is
some mild facet hypertrophy without a definite fracture being demonstrated.
Oblique views were not performed or requested. The bowel gas pattern appears
unremarkable.
IMPRESSION: No acute bony abnormality evident from the AP and lateral study.

## 2011-11-16 ENCOUNTER — Emergency Department: Payer: Self-pay | Admitting: Emergency Medicine

## 2012-05-28 ENCOUNTER — Emergency Department: Payer: Self-pay | Admitting: Internal Medicine

## 2012-05-28 LAB — COMPREHENSIVE METABOLIC PANEL
Albumin: 3.3 g/dL — ABNORMAL LOW (ref 3.4–5.0)
Alkaline Phosphatase: 70 U/L (ref 50–136)
Anion Gap: 4 — ABNORMAL LOW (ref 7–16)
BUN: 10 mg/dL (ref 7–18)
Chloride: 108 mmol/L — ABNORMAL HIGH (ref 98–107)
Creatinine: 0.73 mg/dL (ref 0.60–1.30)
Osmolality: 279 (ref 275–301)
Potassium: 4.2 mmol/L (ref 3.5–5.1)
SGPT (ALT): 24 U/L
Sodium: 141 mmol/L (ref 136–145)
Total Protein: 7.6 g/dL (ref 6.4–8.2)

## 2012-05-28 LAB — CBC
HCT: 40.6 % (ref 35.0–47.0)
MCHC: 33 g/dL (ref 32.0–36.0)
MCV: 93 fL (ref 80–100)
Platelet: 210 10*3/uL (ref 150–440)
WBC: 7.4 10*3/uL (ref 3.6–11.0)

## 2012-05-28 LAB — TROPONIN I: Troponin-I: <0.02 ng/mL

## 2012-05-28 LAB — PROTIME-INR: Prothrombin Time: 12.5 secs (ref 11.5–14.7)

## 2012-05-28 IMAGING — US US EXTREM UP VENOUS*L*
1 series · 14 of 24 positions shown · non-contrast
Comparison: none

REASON FOR EXAM: pain left upper extrem
COMMENTS:

[Series 1: us extrem up venous*left* · 0.09mm/px · 14 of 34 slices shown]
[im 1/34]
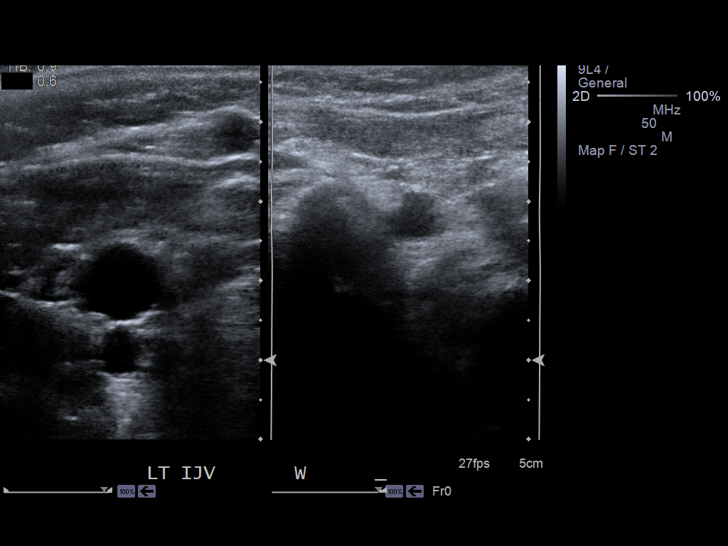
[im 3/34]
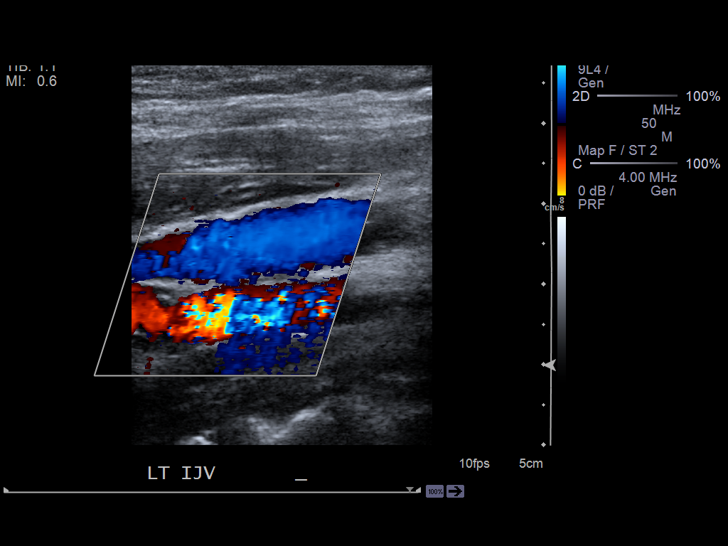
[im 6/34]
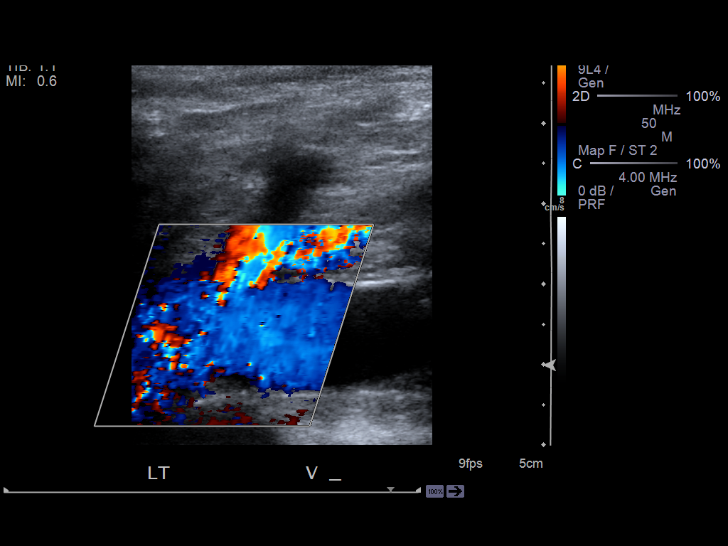
[im 9/34]
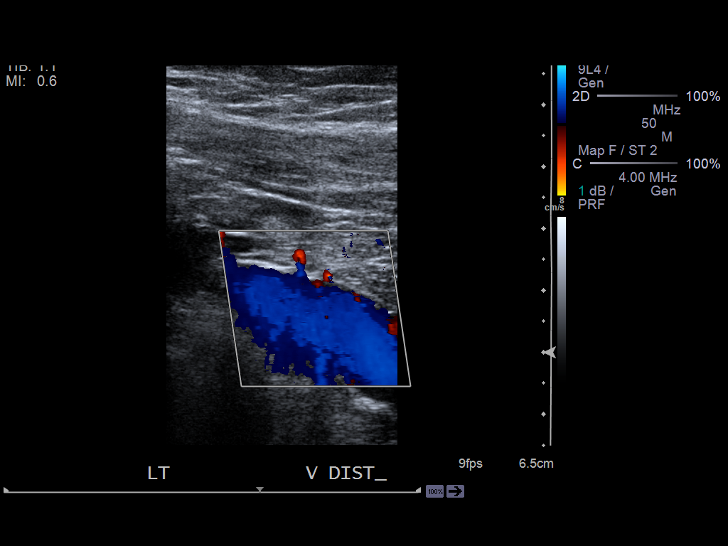
[im 11/34]
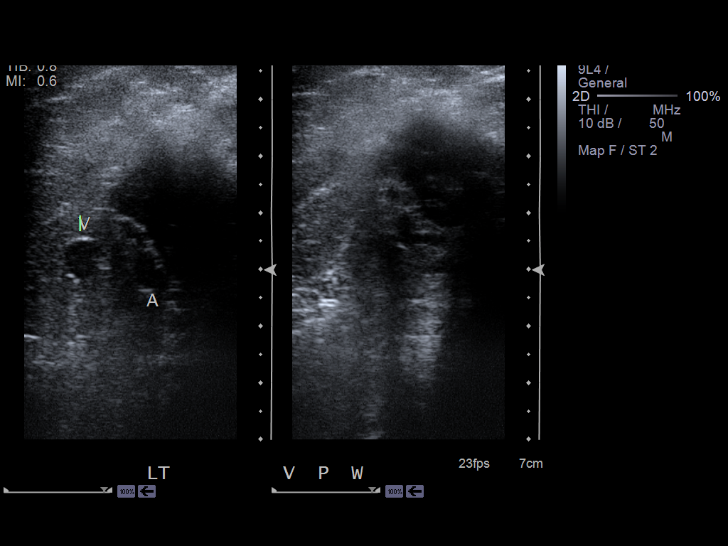
[im 13/34]
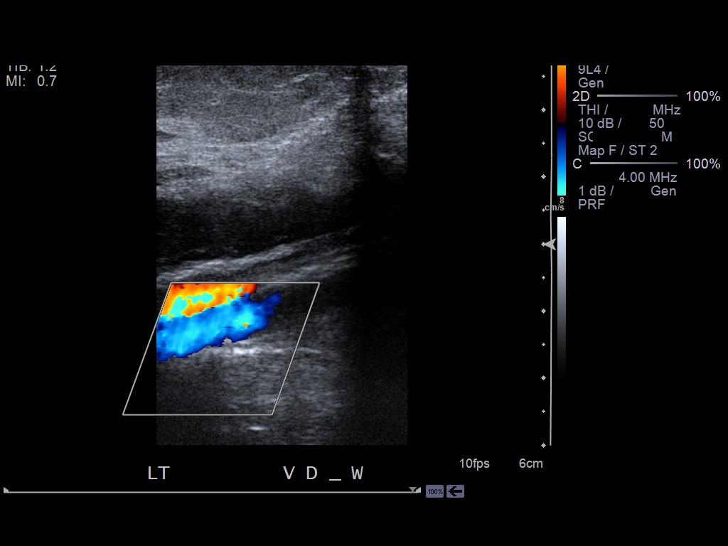
[im 16/34]
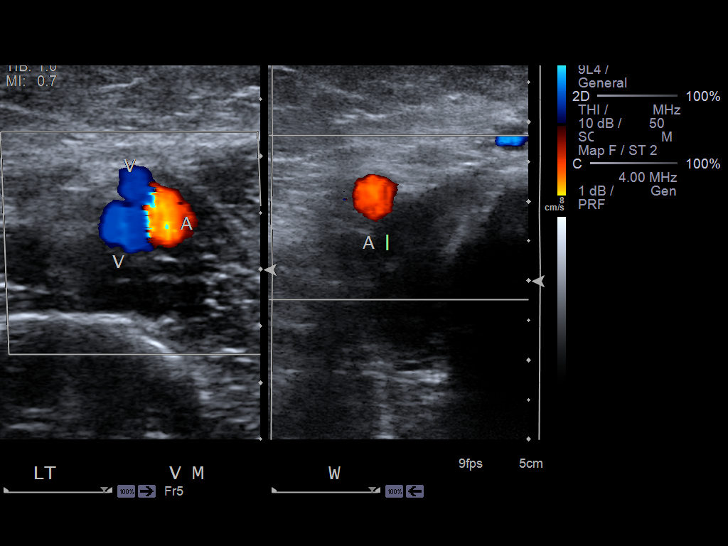
[im 18/34]
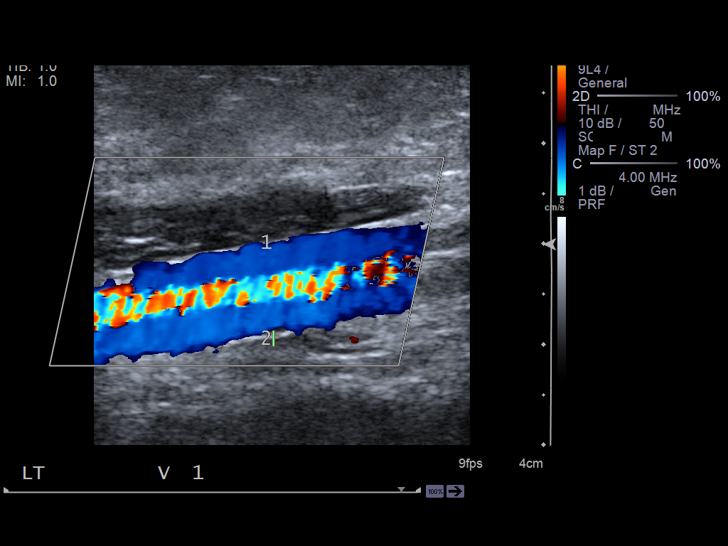
[im 21/34]
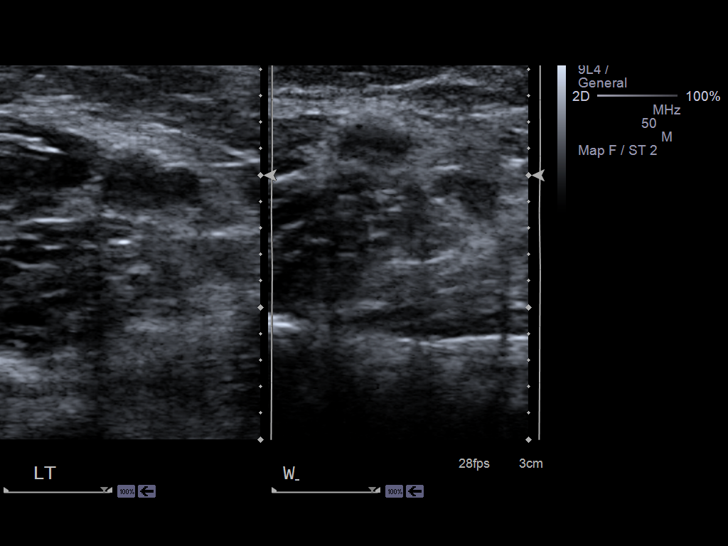
[im 23/34]
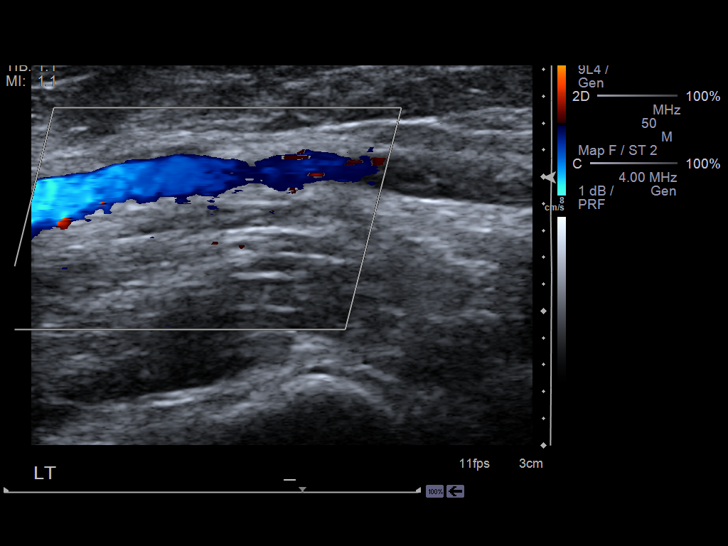
[im 26/34]
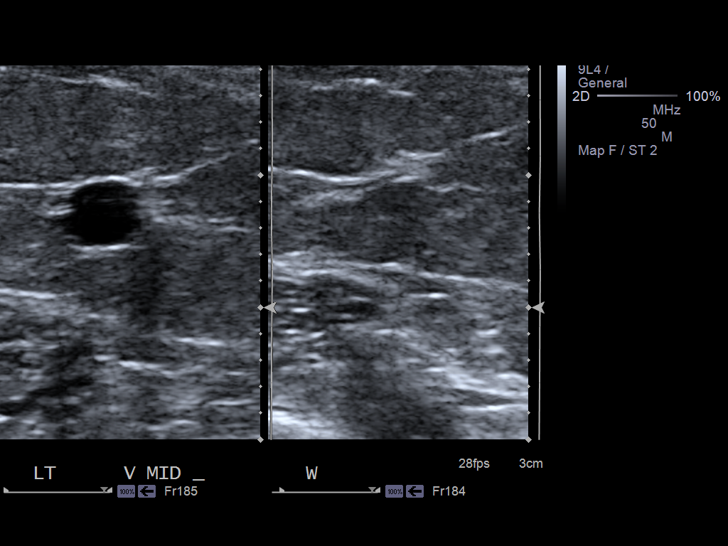
[im 28/34]
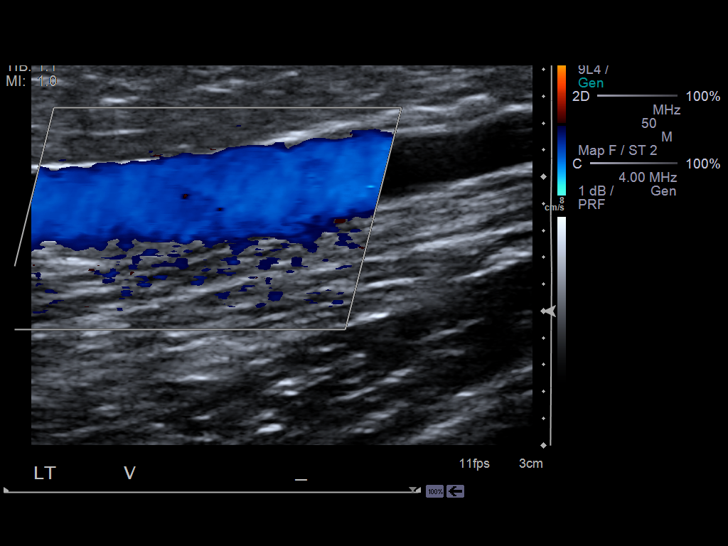
[im 31/34]
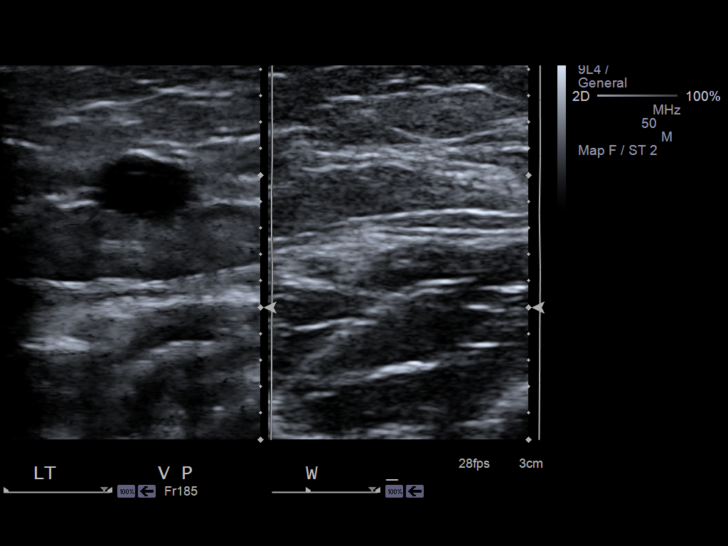
[im 34/34]
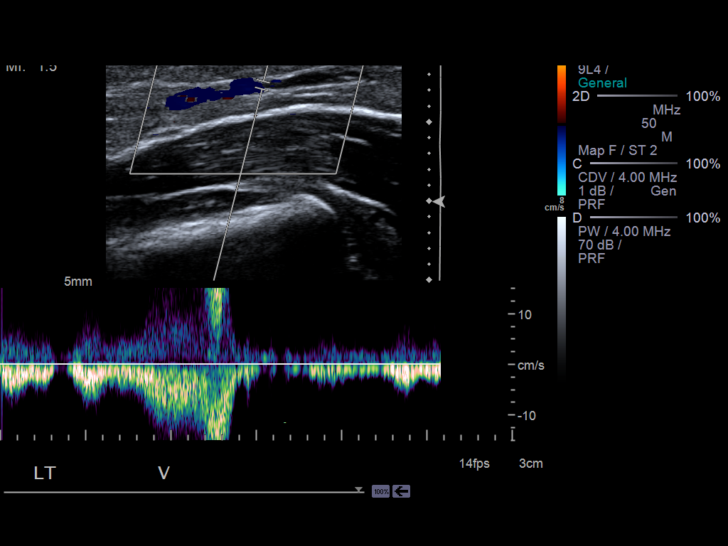

[14 of 24 positions shown; findings below may reference images not displayed]

PROCEDURE:     US  - US DOPPLER UP EXTR LEFT  - [DATE]  [DATE]

RESULT:     Doppler interrogation of the deep veins of the left upper
extremity was performed. The deep veins are normally compressible. The
waveform patterns are normal and the color flow images are normal. The
response to the augmentation and Valsalva maneuvers is normal.
IMPRESSION: There is no evidence of thrombus within the deep veins of
the left upper extremity.

## 2012-05-28 IMAGING — CR DG HAND COMPLETE 3+V*L*
1 series · 3 of 3 positions shown · non-contrast
Comparison: none

REASON FOR EXAM: left hand pain
COMMENTS:

[Series 1: pa · 0.17mm/px · 3 of 3 slices shown]
[im 1/3]
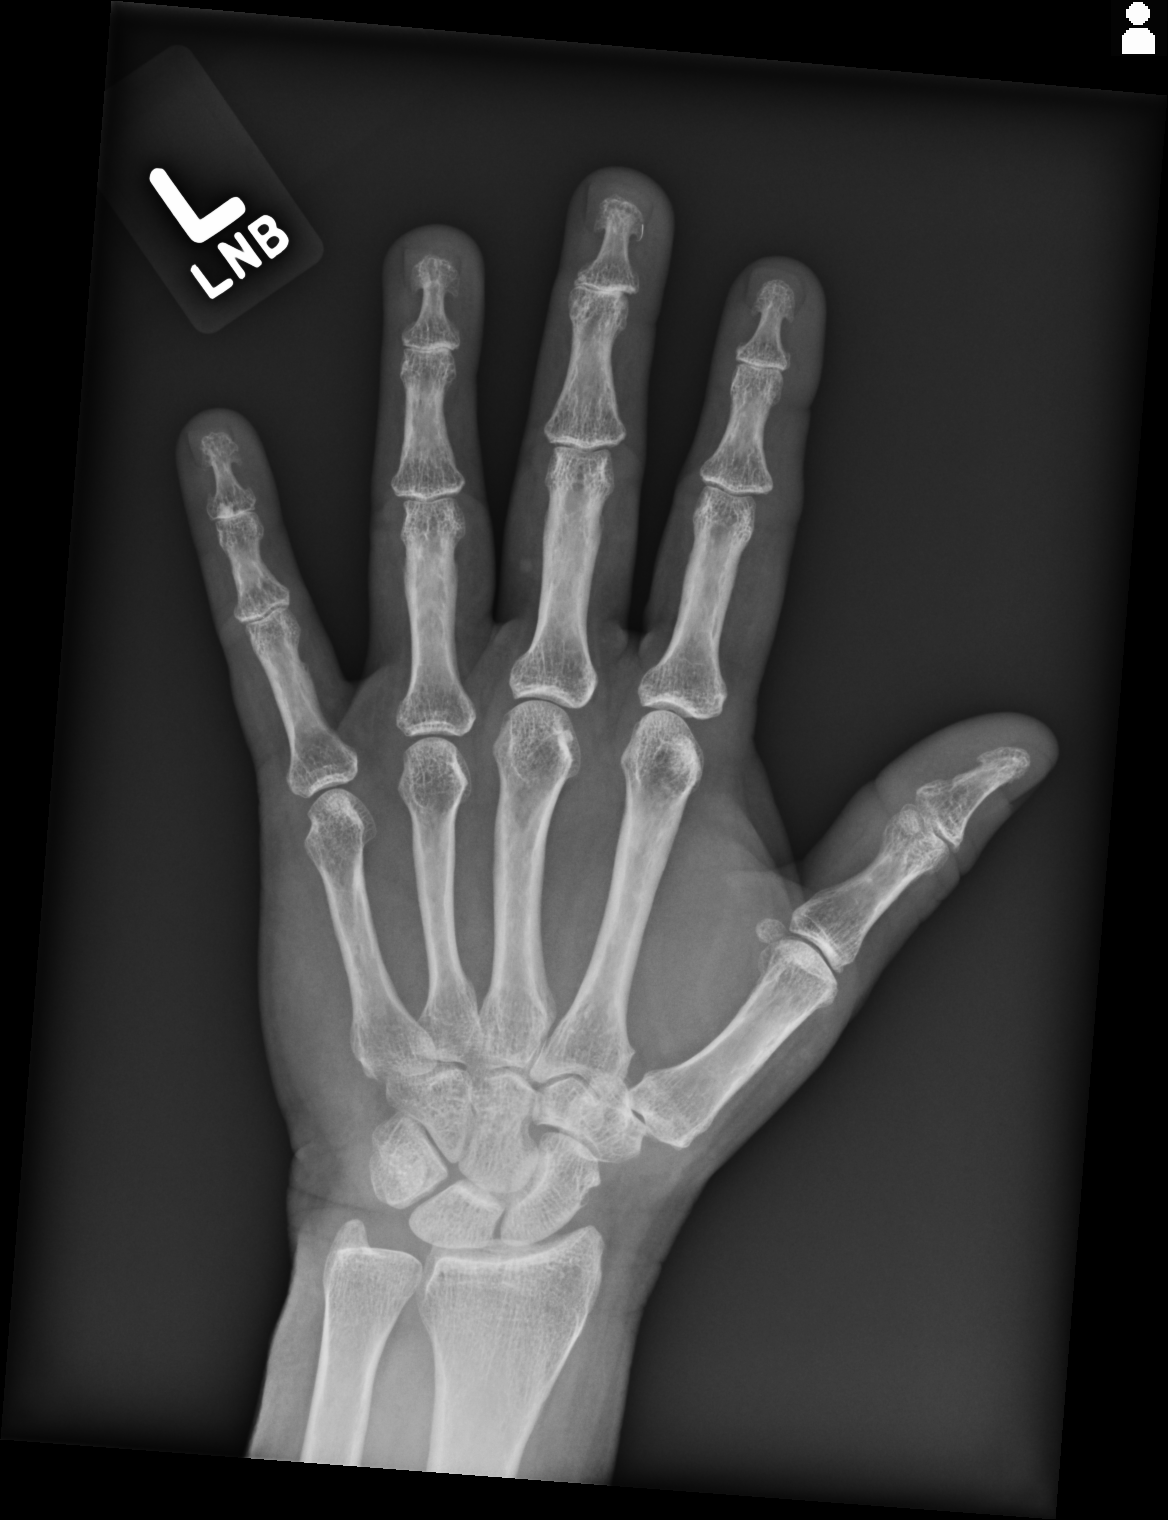
[im 2/3]
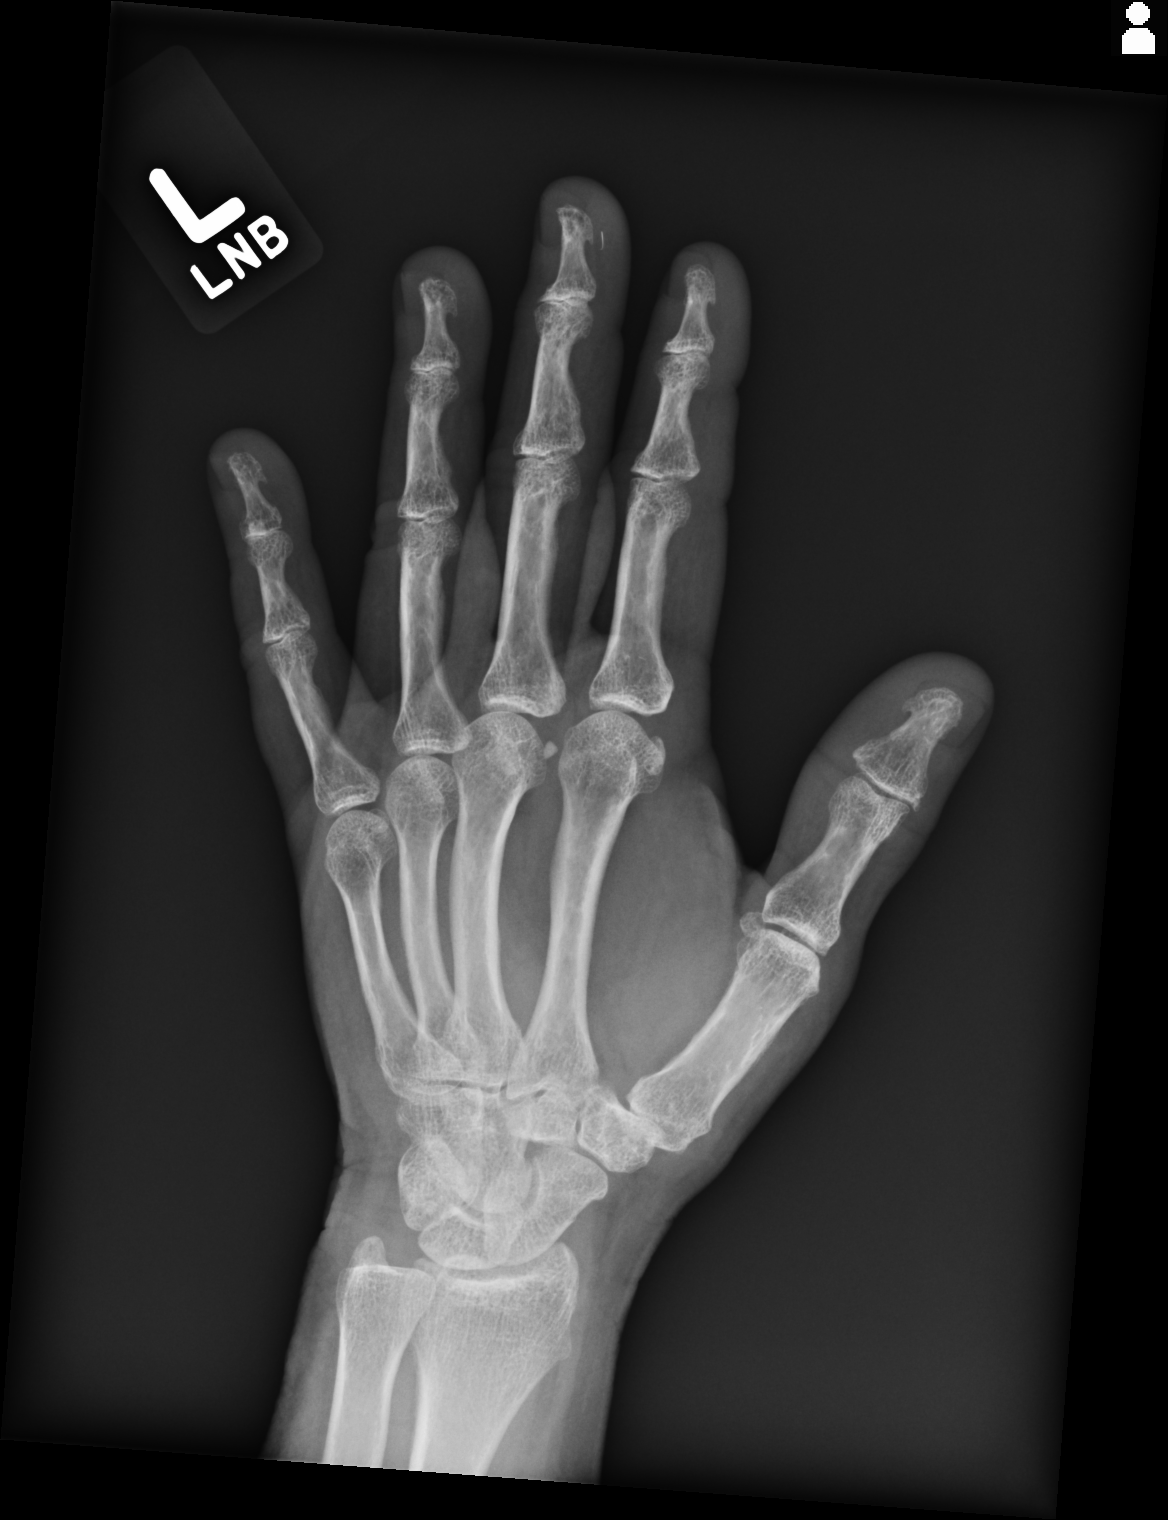
[im 3/3]
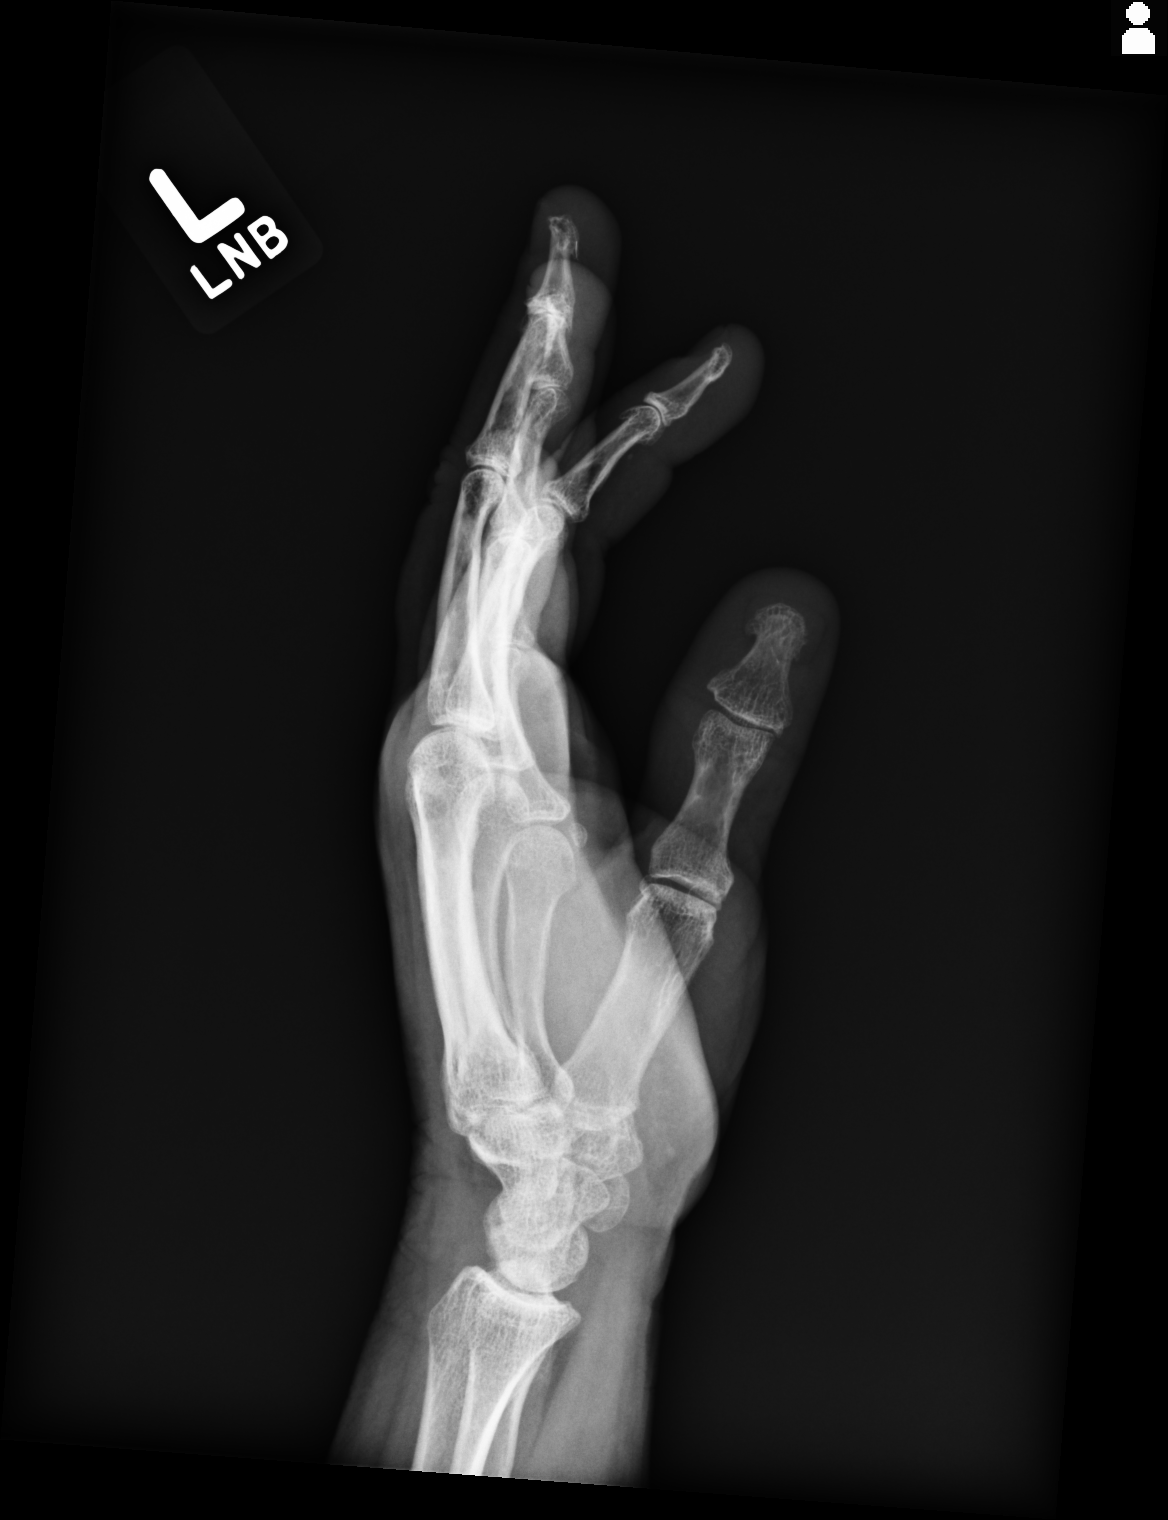

[3 of 3 positions shown; findings below may reference images not displayed]

PROCEDURE:     DXR - DXR HAND LT COMPLETE  W/OBLIQUES  - [DATE]  [DATE]

RESULT:     Three views of the left hand are submitted. There is mild
extra-articular osteopenia of the phalanges. There are degenerative changes
of the interphalangeal joints with joint space narrowing. No erosive changes
are seen. A tiny shard-like metallic radiodensity is seen in the pad of the
third or long finger adjacent to the tuft of the finger. This may reflect a
foreign body. The metacarpals and carpal bones exhibit no acute abnormality.
Mild degenerative change of the first carpometacarpal joint is demonstrated.
IMPRESSION: 1. There is no evidence of an acute or old fracture or periosteal reaction.
2. A 0.5 mm x 3 mm metallic shard-like the density is present in the pad of
the distal phalanx of the third finger.
3. There are degenerative interphalangeal joint changes diffusely.

## 2013-08-14 ENCOUNTER — Emergency Department: Payer: Self-pay | Admitting: Emergency Medicine

## 2013-08-14 LAB — TROPONIN I: Troponin-I: <0.02 ng/mL

## 2013-08-14 LAB — BASIC METABOLIC PANEL
Anion Gap: 3 — ABNORMAL LOW (ref 7–16)
Chloride: 108 mmol/L — ABNORMAL HIGH (ref 98–107)
Co2: 28 mmol/L (ref 21–32)
Creatinine: 0.87 mg/dL (ref 0.60–1.30)
EGFR (African American): >60
Glucose: 145 mg/dL — ABNORMAL HIGH (ref 65–99)
Osmolality: 280 (ref 275–301)
Potassium: 3.5 mmol/L (ref 3.5–5.1)

## 2013-08-14 LAB — CBC
MCV: 91 fL (ref 80–100)
Platelet: 240 10*3/uL (ref 150–440)
WBC: 11 10*3/uL (ref 3.6–11.0)

## 2013-08-14 IMAGING — CR DG CHEST 2V
1 series · 3 of 3 positions shown · non-contrast
Comparison: none

REASON FOR EXAM: SOB
COMMENTS:

PROCEDURE:     DXR - DXR CHEST PA (OR AP) AND LATERAL  - [DATE]  [DATE]
RESULT:     The lungs are clear. The heart and pulmonary vessels are normal.
The bony and mediastinal structures are unremarkable. There is no effusion.
There is no pneumothorax or evidence of congestive failure.

[Series 1: pa · 0.17mm/px · 3 of 3 slices shown]
[im 1/3]
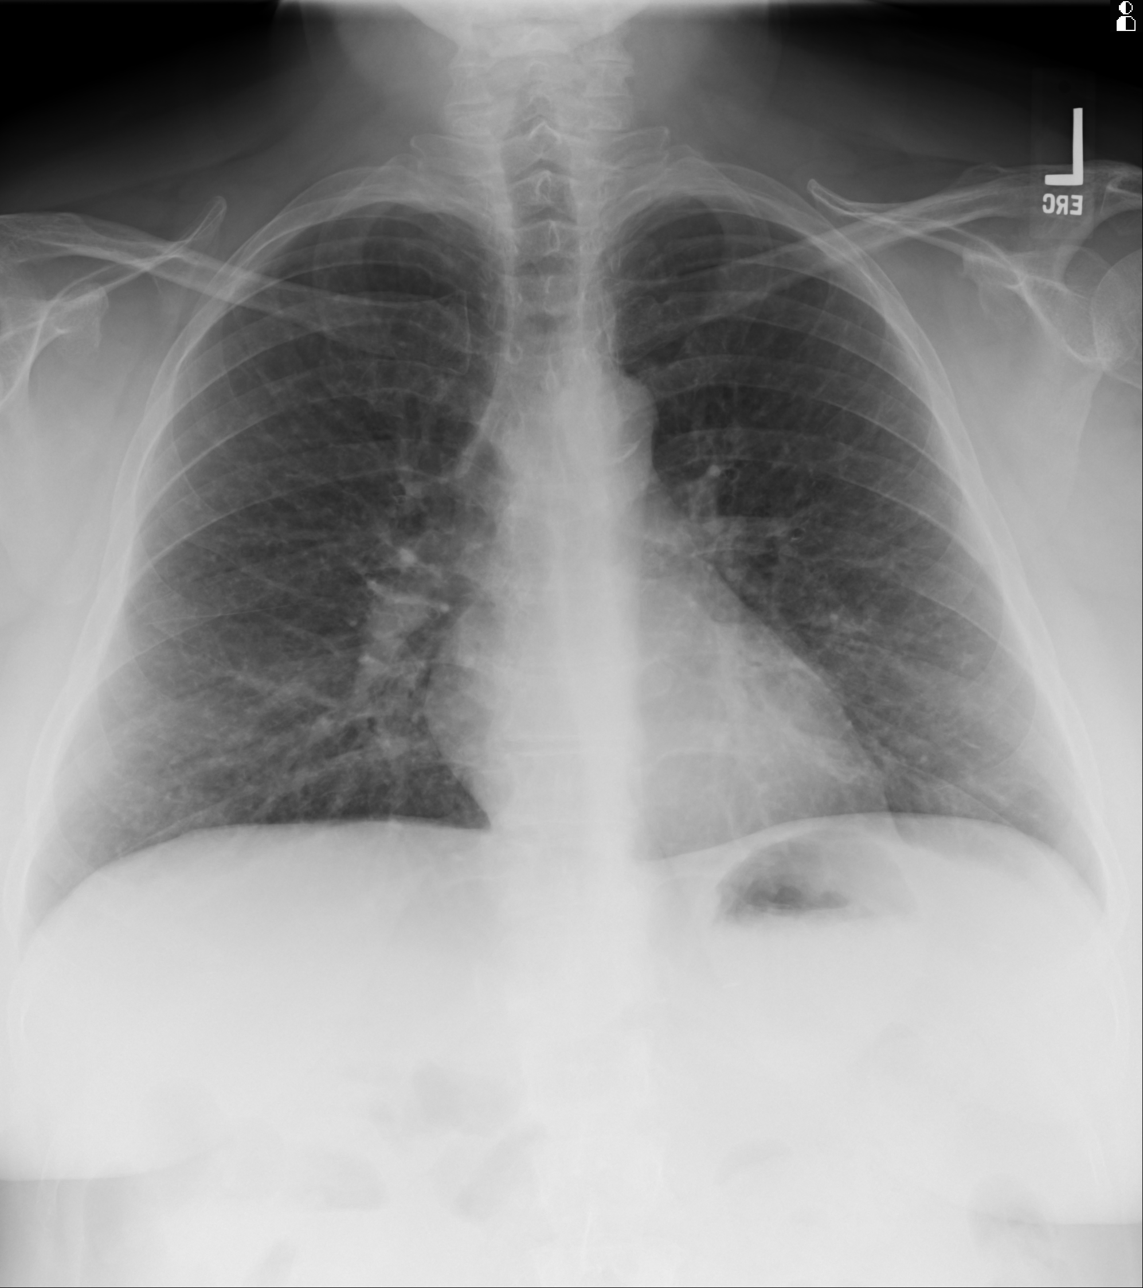
[im 2/3]
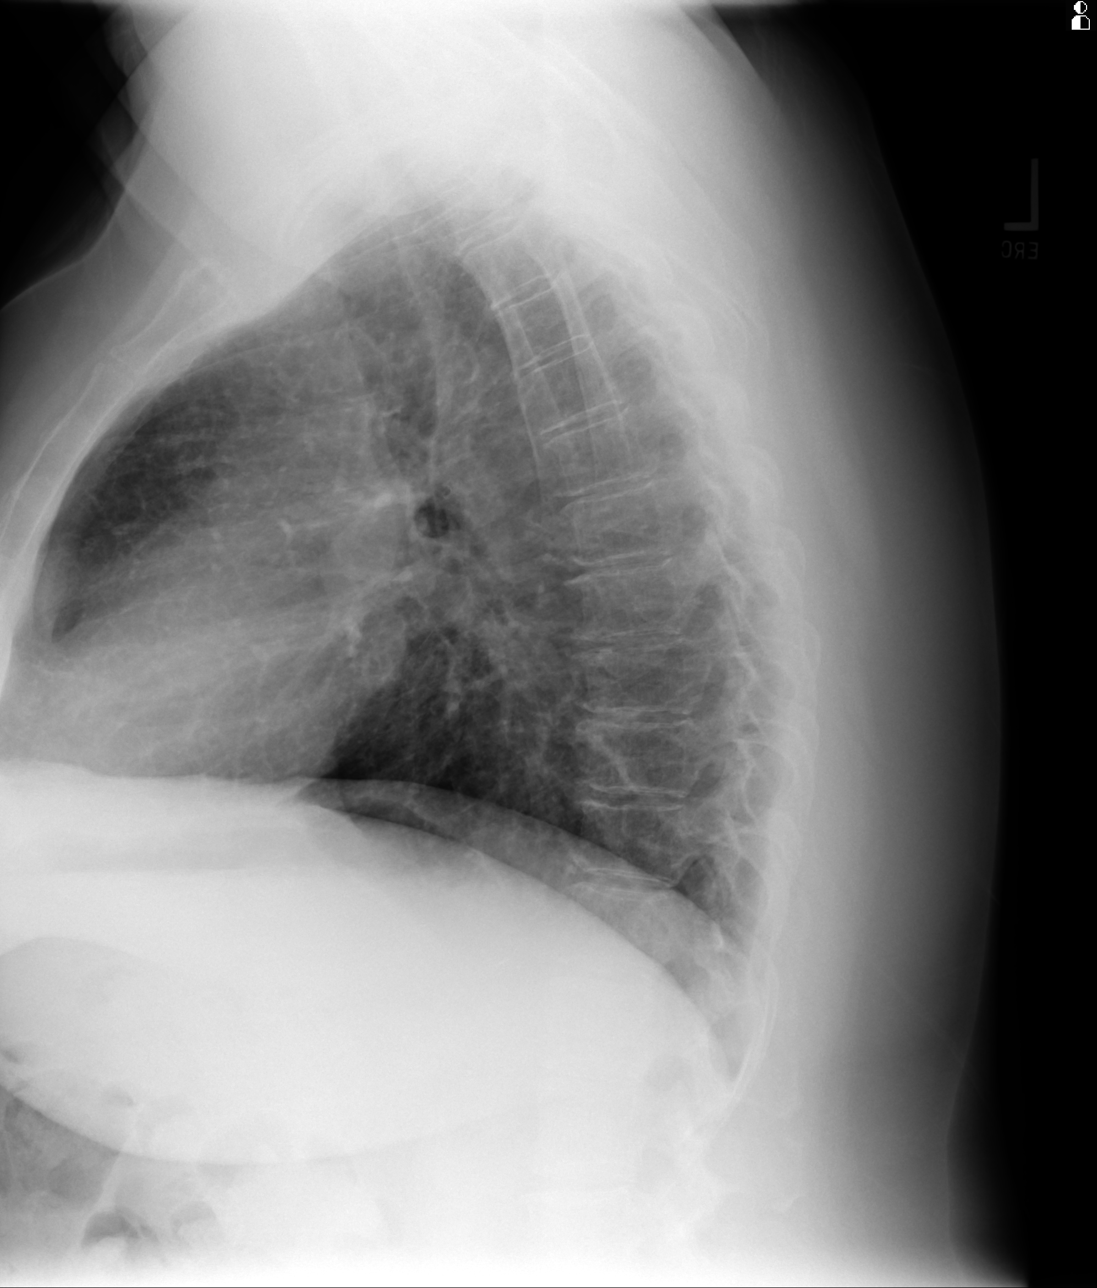
[im 3/3]
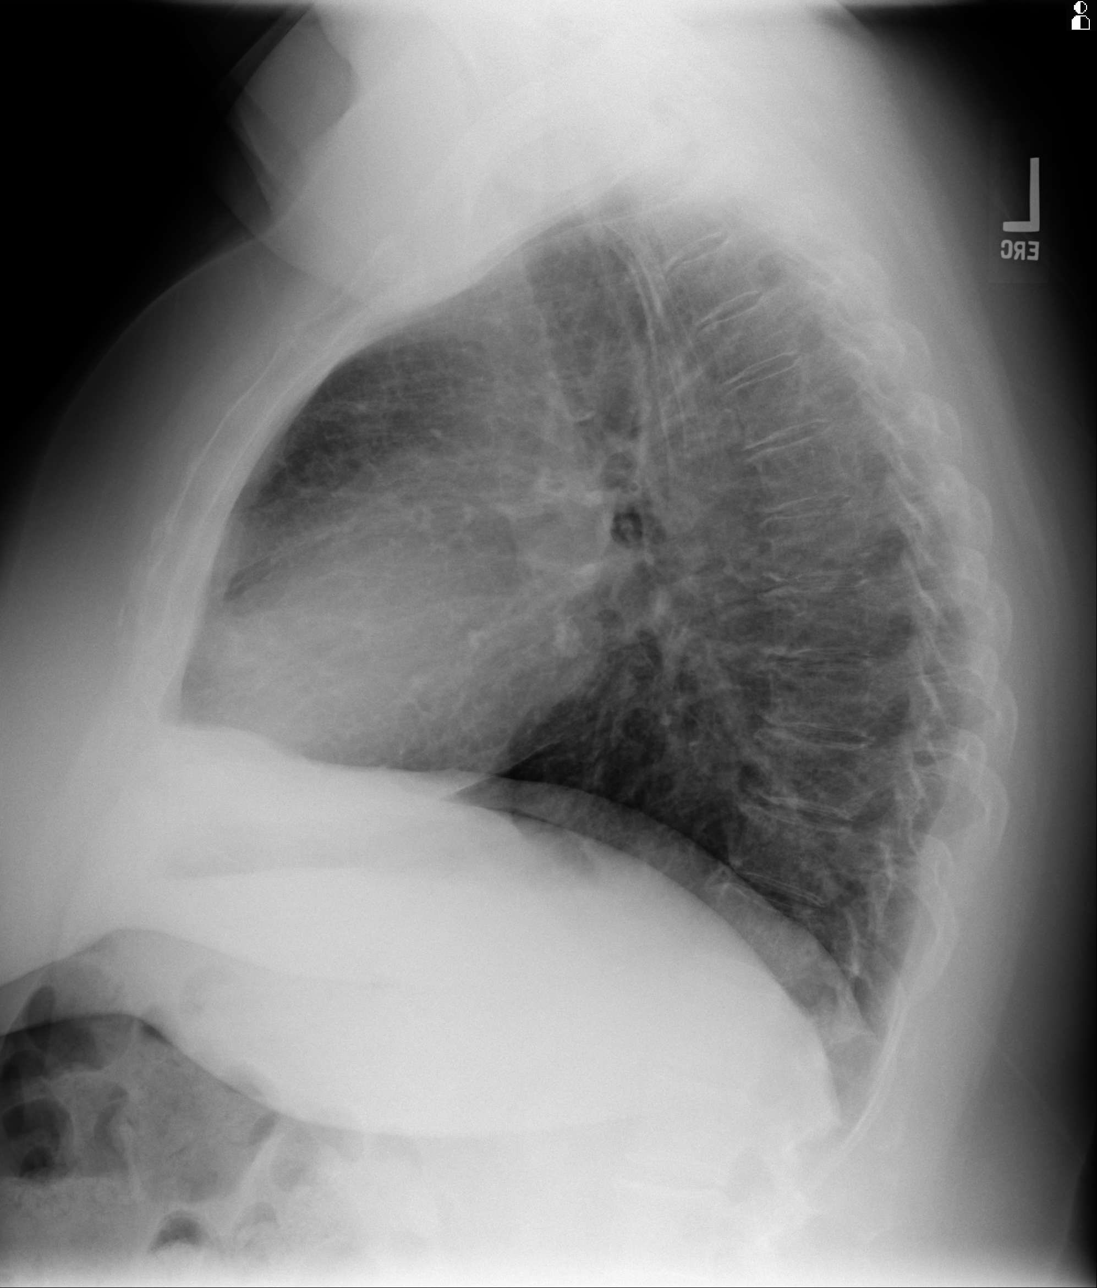

[3 of 3 positions shown; findings below may reference images not displayed]

IMPRESSION: No acute cardiopulmonary disease.

[REDACTED]

## 2013-11-24 ENCOUNTER — Emergency Department: Payer: Self-pay | Admitting: Emergency Medicine

## 2013-12-21 ENCOUNTER — Emergency Department: Payer: Self-pay | Admitting: Internal Medicine

## 2014-05-22 ENCOUNTER — Emergency Department: Payer: Self-pay | Admitting: Emergency Medicine
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
## 2015-03-28 ENCOUNTER — Other Ambulatory Visit: Payer: Self-pay | Admitting: Family Medicine

## 2015-03-28 DIAGNOSIS — I10 Essential (primary) hypertension: Secondary | ICD-10-CM

## 2015-04-10 ENCOUNTER — Other Ambulatory Visit: Payer: Self-pay | Admitting: Family Medicine

## 2015-04-10 ENCOUNTER — Ambulatory Visit
Admission: RE | Admit: 2015-04-10 | Discharge: 2015-04-10 | Disposition: A | Discharge status: Home / Self Care | Payer: Disability Insurance | Source: Ambulatory Visit | Attending: Family Medicine | Admitting: Family Medicine

## 2015-04-10 ENCOUNTER — Ambulatory Visit: Payer: Disability Insurance

## 2015-04-10 DIAGNOSIS — F819 Developmental disorder of scholastic skills, unspecified: Secondary | ICD-10-CM | POA: Diagnosis not present

## 2015-04-10 DIAGNOSIS — M1990 Unspecified osteoarthritis, unspecified site: Secondary | ICD-10-CM

## 2015-04-10 DIAGNOSIS — M5186 Other intervertebral disc disorders, lumbar region: Secondary | ICD-10-CM | POA: Insufficient documentation

## 2015-04-10 DIAGNOSIS — G5602 Carpal tunnel syndrome, left upper limb: Secondary | ICD-10-CM | POA: Insufficient documentation

## 2015-04-10 DIAGNOSIS — J449 Chronic obstructive pulmonary disease, unspecified: Secondary | ICD-10-CM | POA: Insufficient documentation

## 2015-04-10 DIAGNOSIS — I10 Essential (primary) hypertension: Secondary | ICD-10-CM | POA: Insufficient documentation

## 2015-04-10 DIAGNOSIS — G5601 Carpal tunnel syndrome, right upper limb: Secondary | ICD-10-CM | POA: Diagnosis not present

## 2015-04-10 IMAGING — DX DG HIP (WITH OR WITHOUT PELVIS) 2-3V*L*
3 series · 3 of 3 positions shown · non-contrast
Comparison: None.

CLINICAL DATA: Back pain, left hip pain, osteoarthritis, pain for
several years

EXAM:
LEFT HIP (WITH PELVIS) 2-3 VIEWS

[pelvis ap]
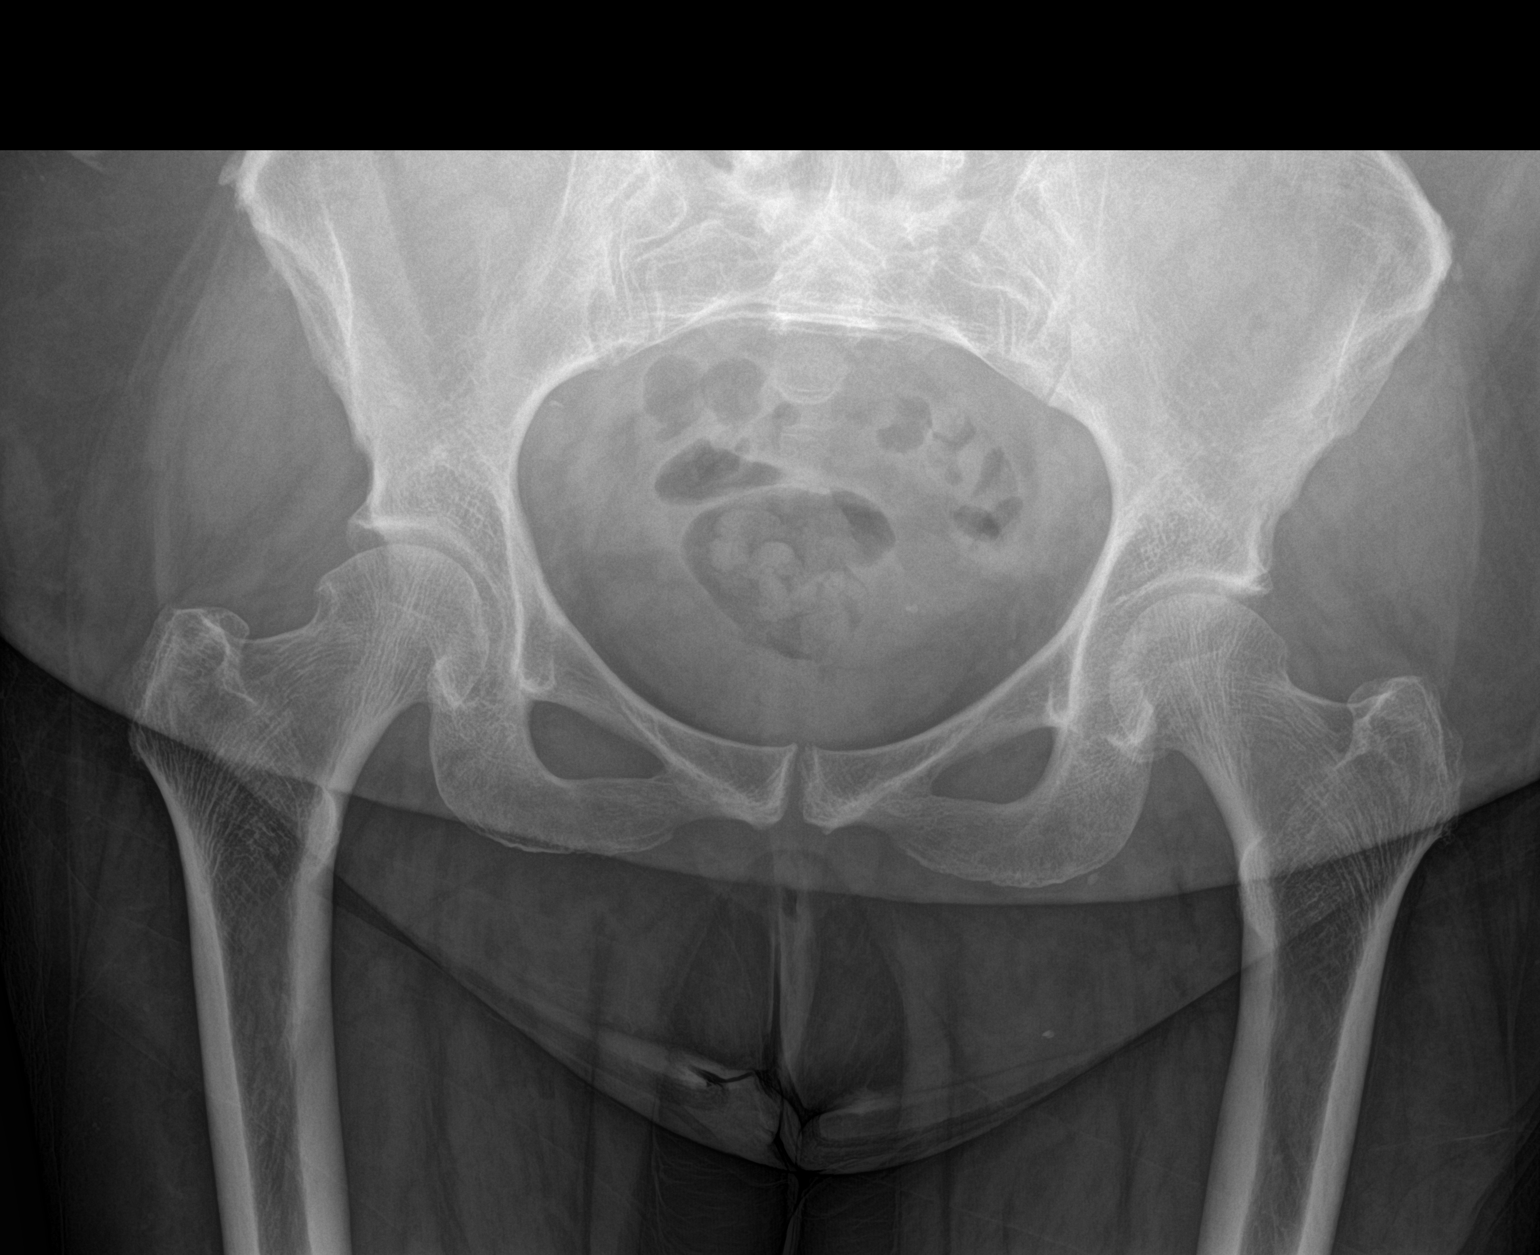

[hip ap]
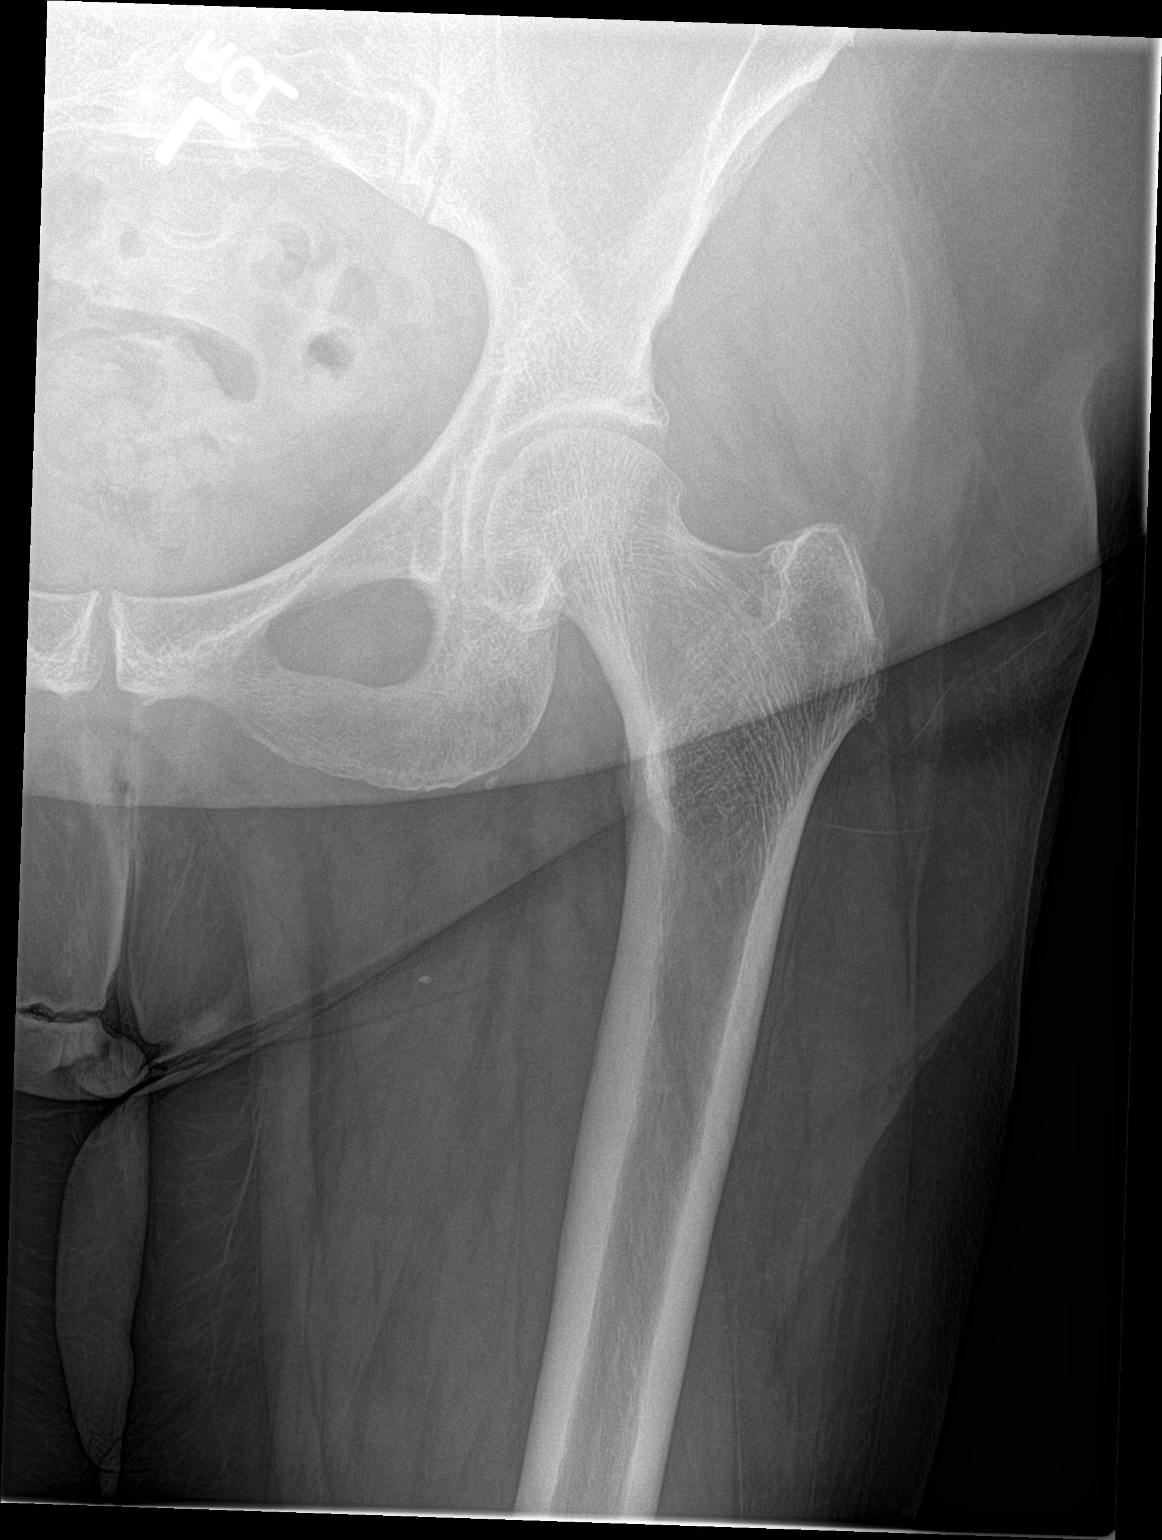

[hip lat]
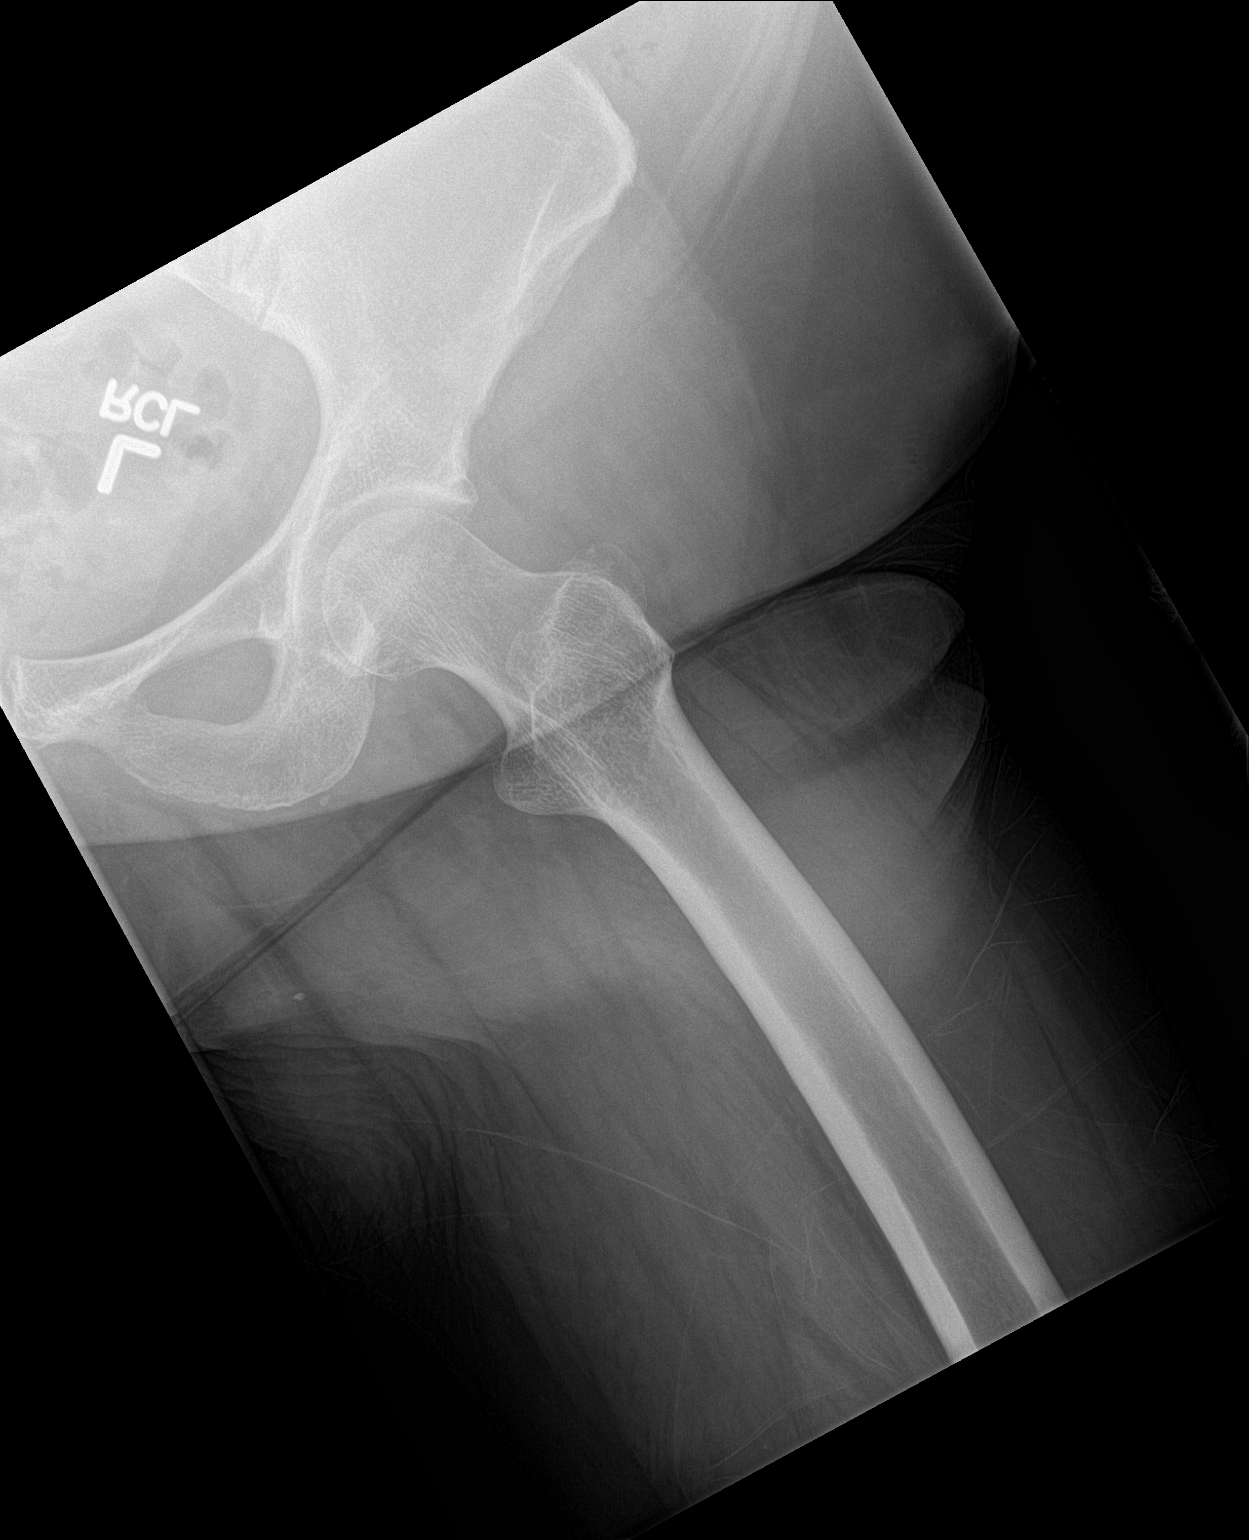

[3 of 3 positions shown; findings below may reference images not displayed]

FINDINGS: Three views of the left hip submitted. No acute fracture or
subluxation. Bilateral hip joints are symmetrical in appearance.
Joint space is preserved.
IMPRESSION: Negative.

## 2015-04-10 IMAGING — DX DG LUMBAR SPINE 2-3V
3 series · 3 of 3 positions shown · non-contrast
Comparison: [DATE]

CLINICAL DATA: Left hip pain, arthritis

EXAM:
LUMBAR SPINE - 2-3 VIEW

[l-spine ap]
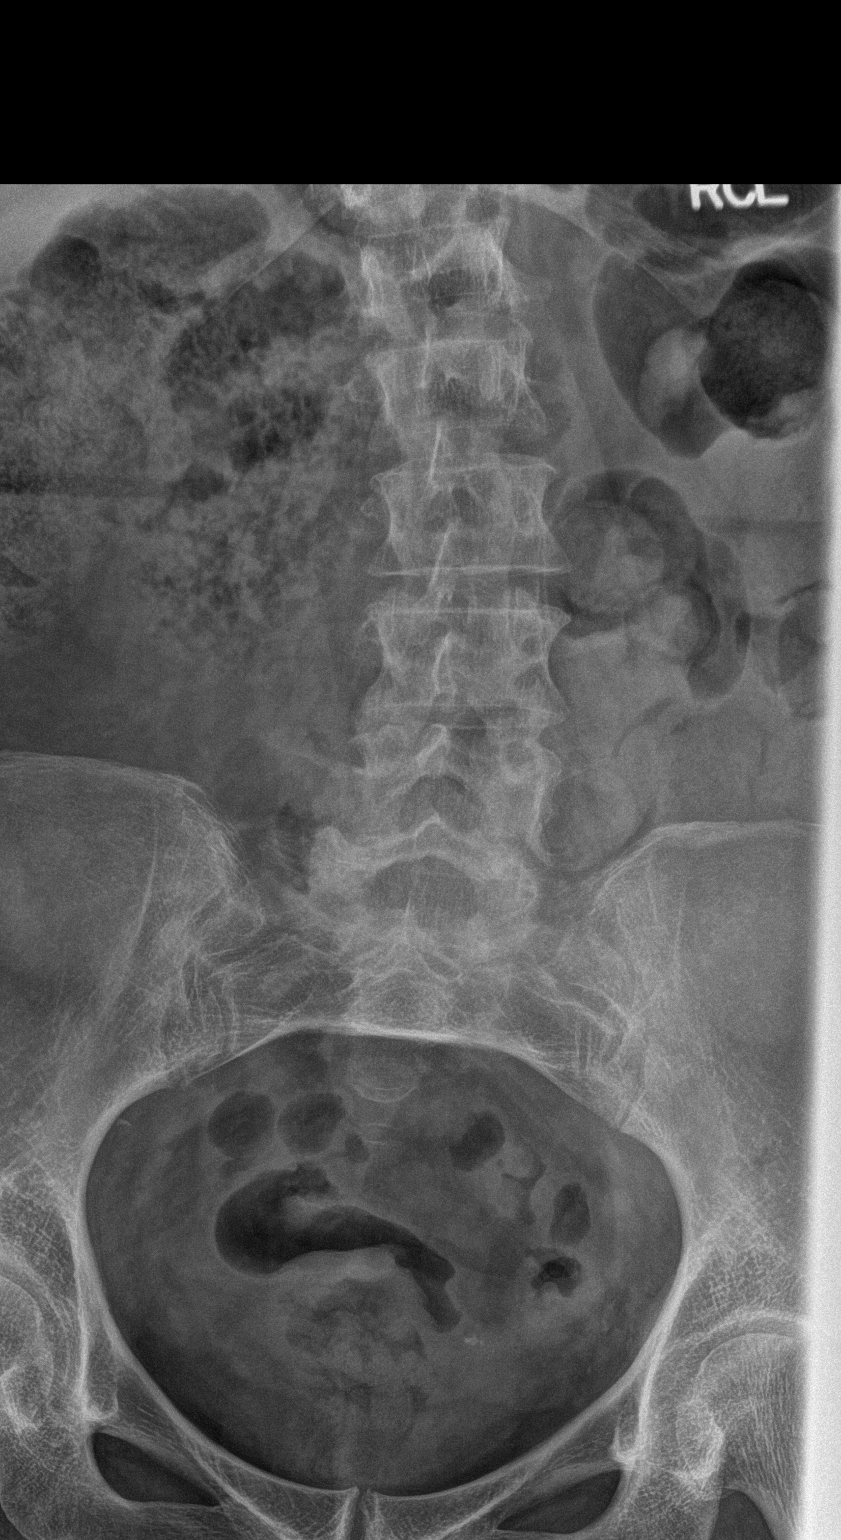

[l-spine lat]
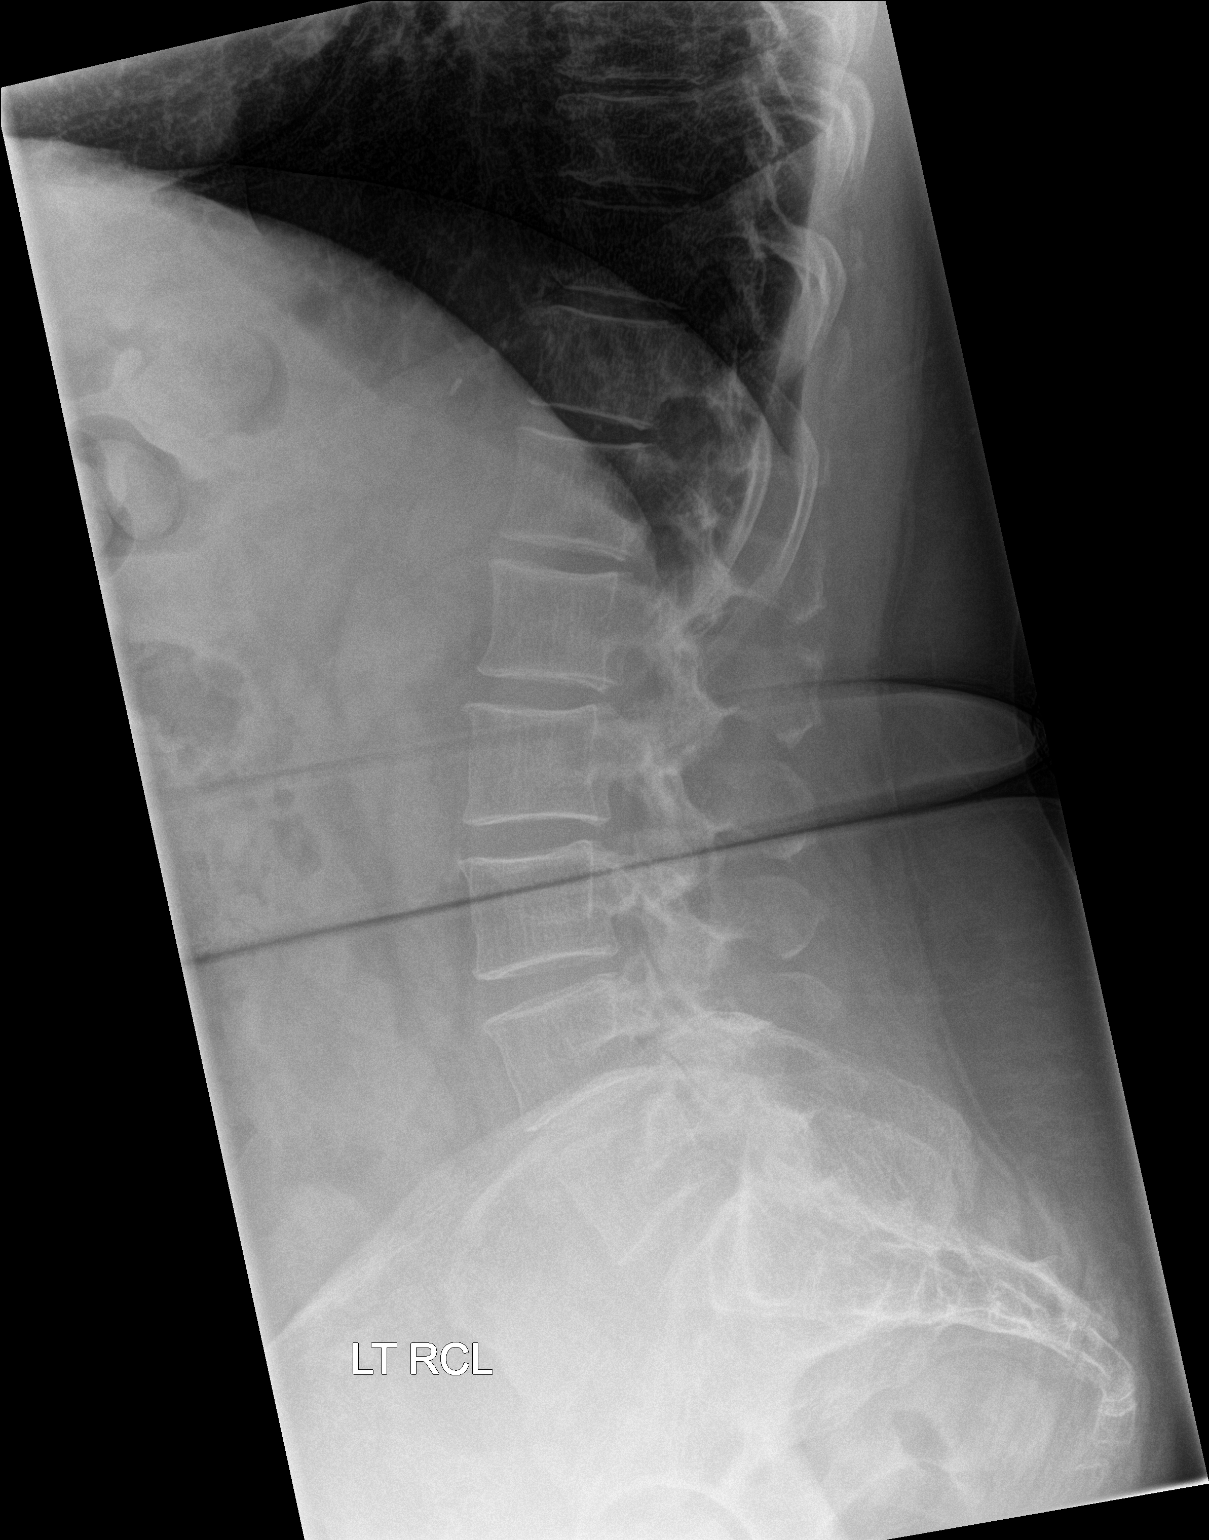

[l-spine spot]
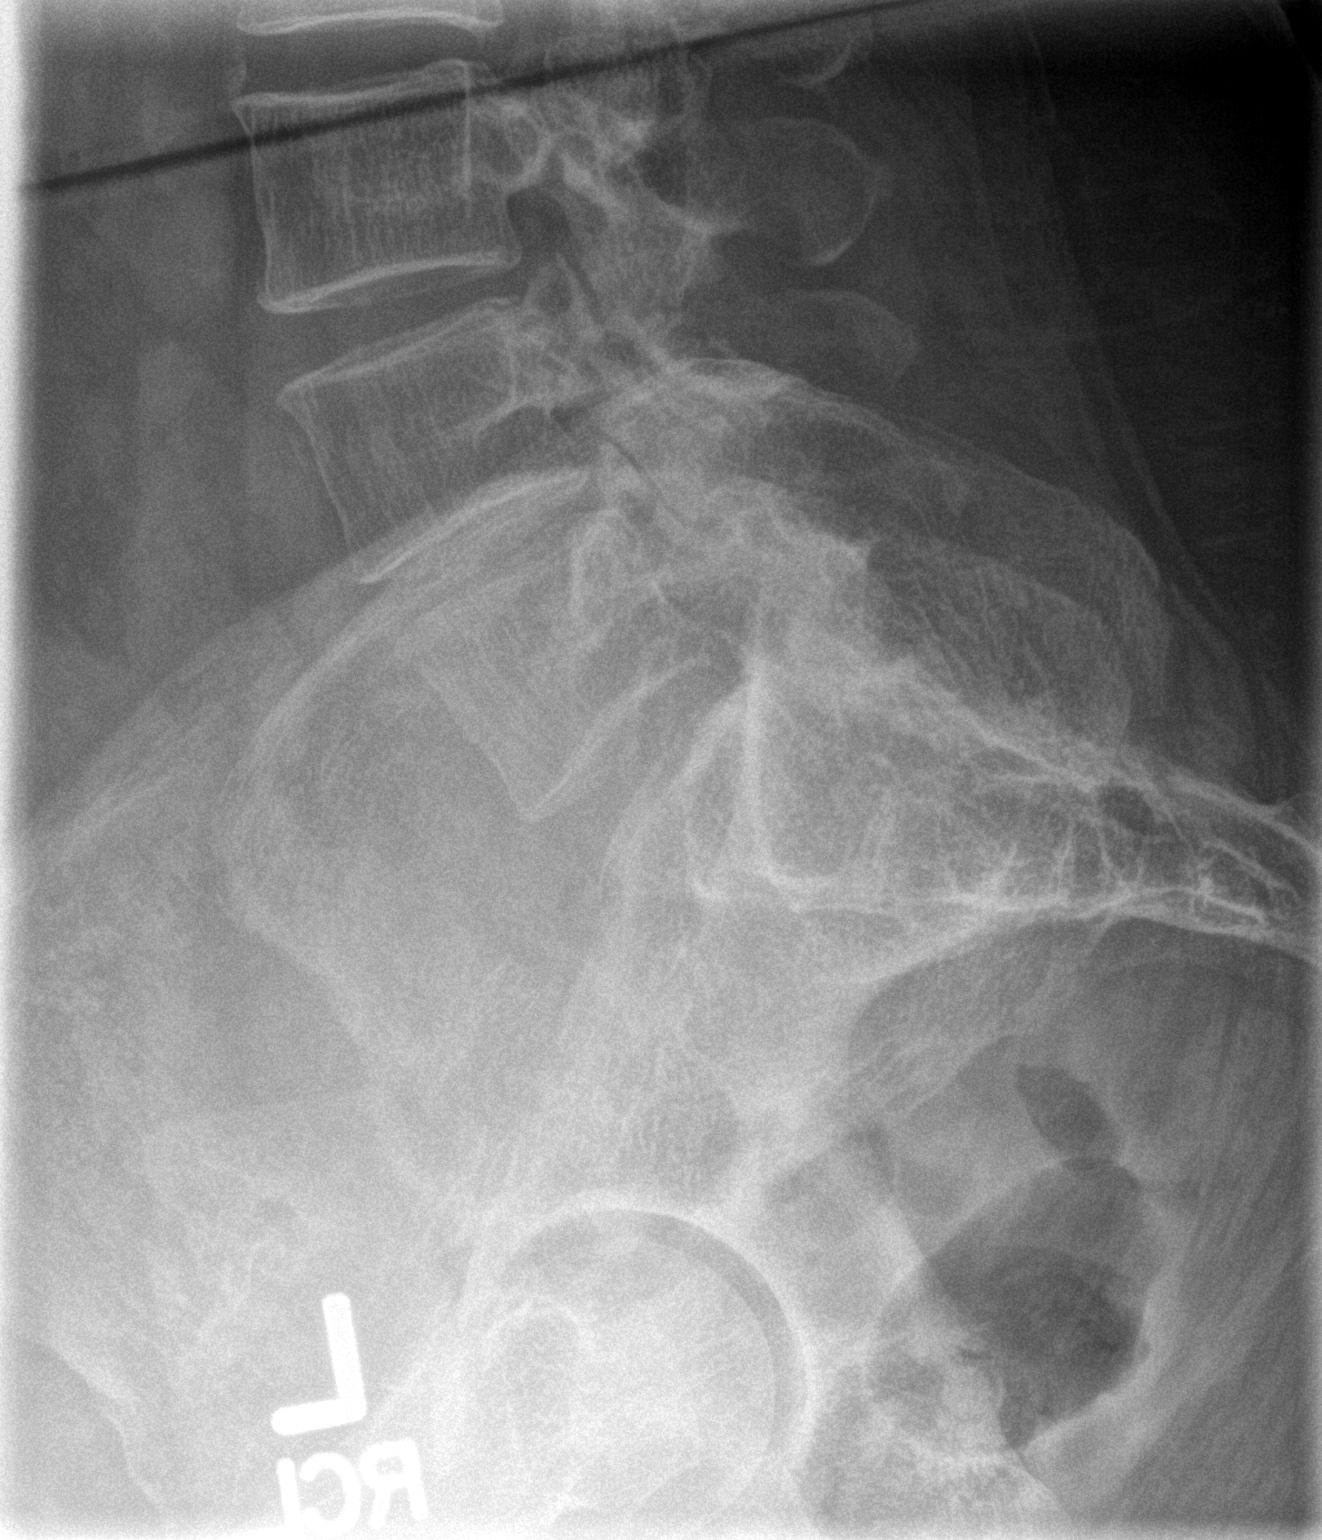

[3 of 3 positions shown; findings below may reference images not displayed]

FINDINGS: Three views of lumbar spine submitted. There is mild levoscoliosis.
No acute fracture or subluxation. Mild disc space flattening at
L4-L5 and L5-S1 level.
IMPRESSION: No acute fracture or subluxation. Mild disc space flattening at
L4-L5 and L5-S1 level. Mild levoscoliosis
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
## 2015-04-19 ENCOUNTER — Other Ambulatory Visit: Payer: Self-pay | Admitting: Internal Medicine

## 2017-04-07 ENCOUNTER — Emergency Department
Admission: EM | Admit: 2017-04-07 | Discharge: 2017-04-07 | Disposition: A | Discharge status: Home / Self Care | Payer: No Typology Code available for payment source | Attending: Emergency Medicine | Admitting: Emergency Medicine

## 2017-04-07 ENCOUNTER — Emergency Department: Payer: No Typology Code available for payment source

## 2017-04-07 ENCOUNTER — Encounter: Payer: Self-pay | Admitting: Emergency Medicine

## 2017-04-07 DIAGNOSIS — S82892A Other fracture of left lower leg, initial encounter for closed fracture: Secondary | ICD-10-CM

## 2017-04-07 DIAGNOSIS — Y92410 Unspecified street and highway as the place of occurrence of the external cause: Secondary | ICD-10-CM | POA: Insufficient documentation

## 2017-04-07 DIAGNOSIS — F17200 Nicotine dependence, unspecified, uncomplicated: Secondary | ICD-10-CM | POA: Diagnosis not present

## 2017-04-07 DIAGNOSIS — S0083XA Contusion of other part of head, initial encounter: Secondary | ICD-10-CM | POA: Diagnosis not present

## 2017-04-07 DIAGNOSIS — Y999 Unspecified external cause status: Secondary | ICD-10-CM | POA: Diagnosis not present

## 2017-04-07 DIAGNOSIS — S8252XA Displaced fracture of medial malleolus of left tibia, initial encounter for closed fracture: Secondary | ICD-10-CM | POA: Diagnosis not present

## 2017-04-07 DIAGNOSIS — S99912A Unspecified injury of left ankle, initial encounter: Secondary | ICD-10-CM | POA: Diagnosis present

## 2017-04-07 DIAGNOSIS — Y9389 Activity, other specified: Secondary | ICD-10-CM | POA: Insufficient documentation
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
## 2017-04-07 MED ORDER — OXYCODONE-ACETAMINOPHEN 7.5-325 MG PO TABS: 1.0000 | ORAL_TABLET | Freq: Four times a day (QID) | ORAL | 0 refills | Status: DC | PRN

## 2017-04-07 MED ORDER — CYCLOBENZAPRINE HCL 10 MG PO TABS: 10.0000 mg | ORAL_TABLET | Freq: Three times a day (TID) | ORAL | 0 refills | Status: DC | PRN

## 2017-04-07 MED ORDER — ORPHENADRINE CITRATE 30 MG/ML IJ SOLN
60.0000 mg | Freq: Two times a day (BID) | INTRAMUSCULAR | Status: DC
  Administered 2017-04-07: 60 mg via INTRAMUSCULAR
  Filled 2017-04-07: qty 2

## 2017-04-07 MED ORDER — IBUPROFEN 600 MG PO TABS: 600.0000 mg | ORAL_TABLET | Freq: Three times a day (TID) | ORAL | 0 refills | Status: DC | PRN

## 2017-04-07 MED ORDER — HYDROMORPHONE HCL 1 MG/ML IJ SOLN
1.0000 mg | Freq: Once | INTRAMUSCULAR | Status: AC
  Administered 2017-04-07: 1 mg via INTRAMUSCULAR
  Filled 2017-04-07: qty 1
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
## 2017-04-07 NOTE — Discharge Instructions (Signed)
ankle splint for 7-10 days as needed.
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
## 2017-05-07 IMAGING — US US CAROTID DUPLEX BILAT
1 series · 13 of 24 positions shown · non-contrast
Comparison: None.

CLINICAL DATA: Carotid artery disease.

EXAM:
BILATERAL CAROTID DUPLEX ULTRASOUND
TECHNIQUE: Gray scale imaging, color Doppler and duplex ultrasound were
performed of bilateral carotid and vertebral arteries in the neck.

[Series 1: us carotid duplex bilat · 13 of 64 slices shown]
[im 1/64]
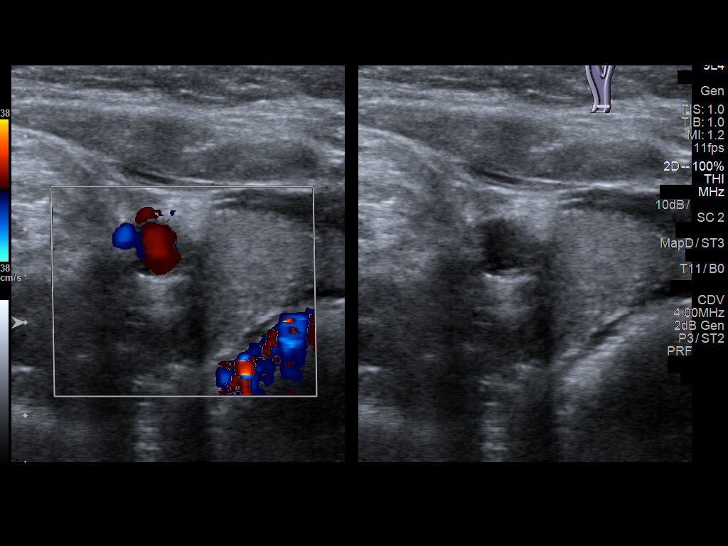
[im 6/64]
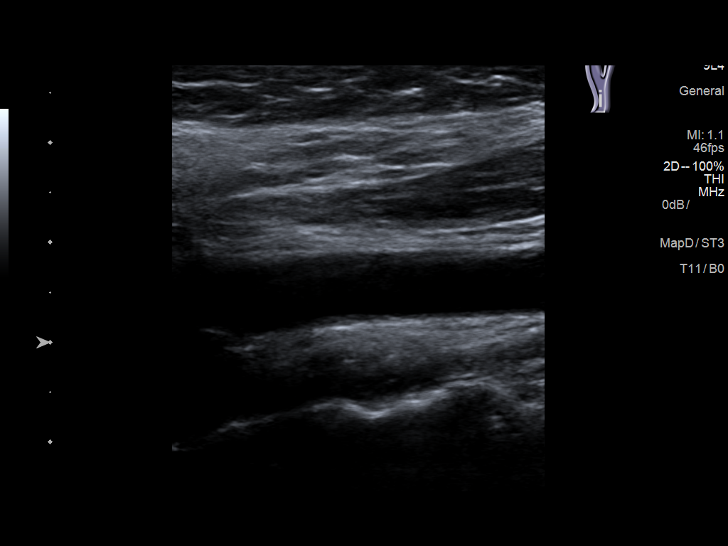
[im 11/64]
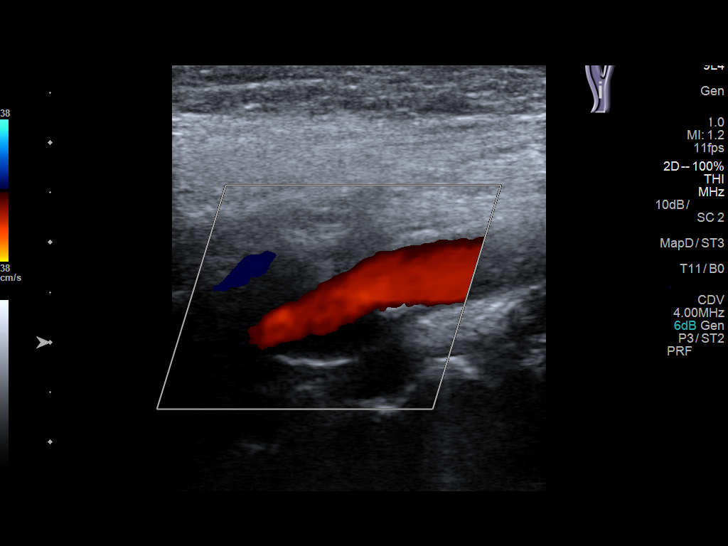
[im 17/64]
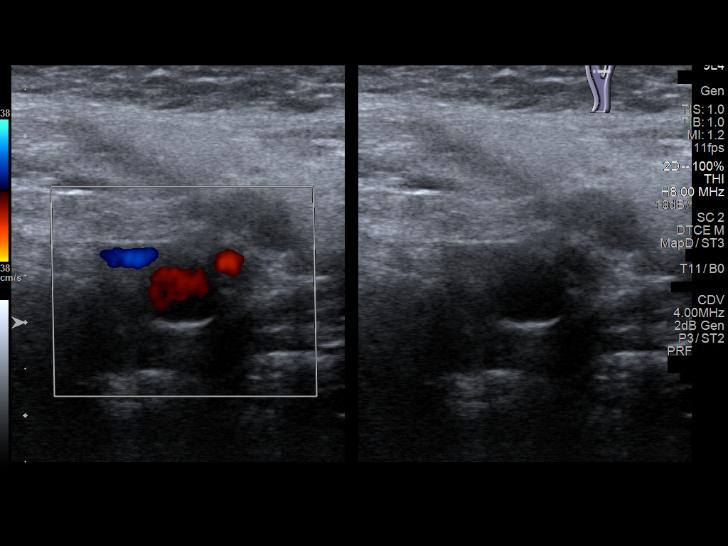
[im 22/64]
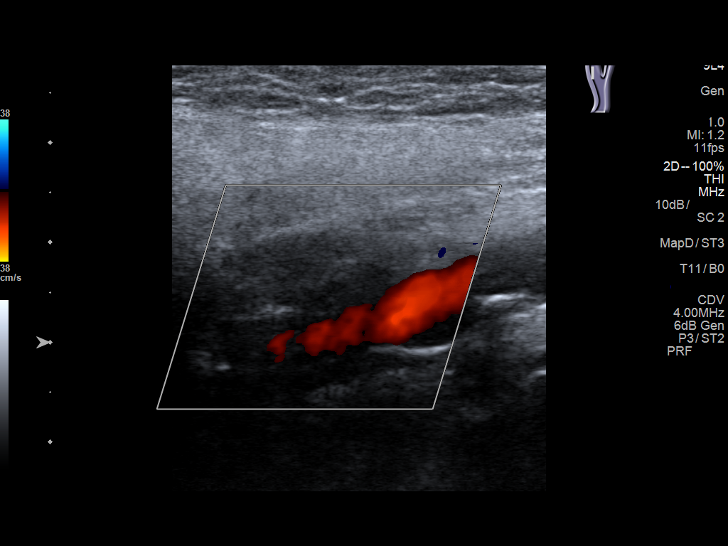
[im 28/64]
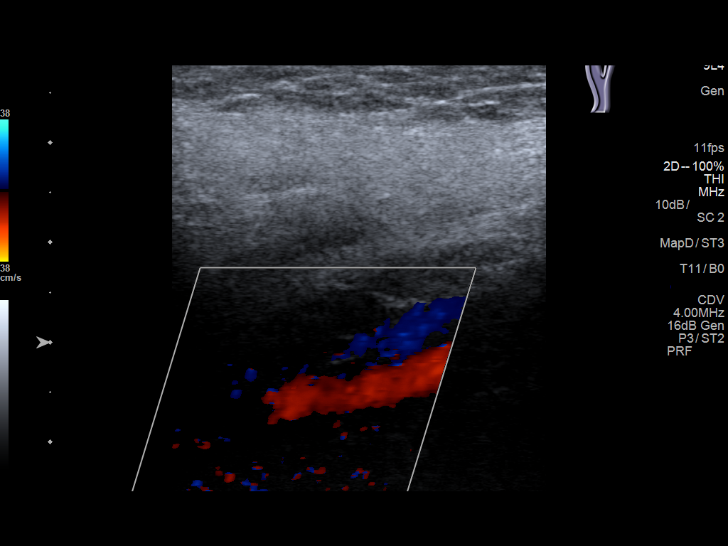
[im 33/64]
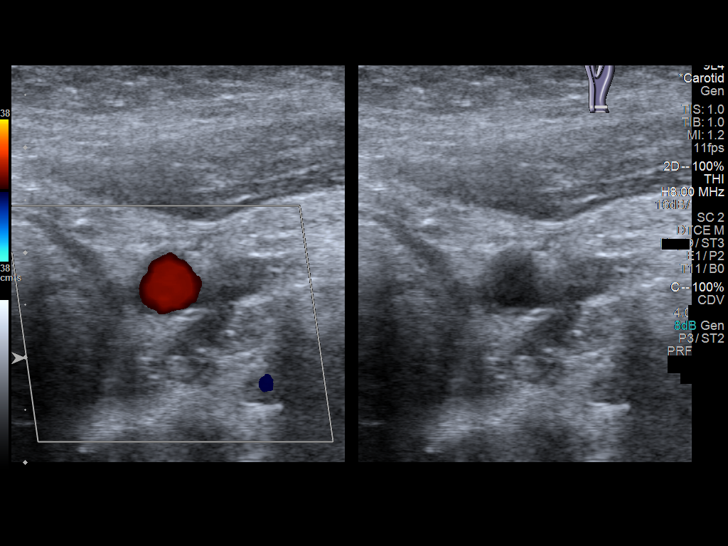
[im 36/64]
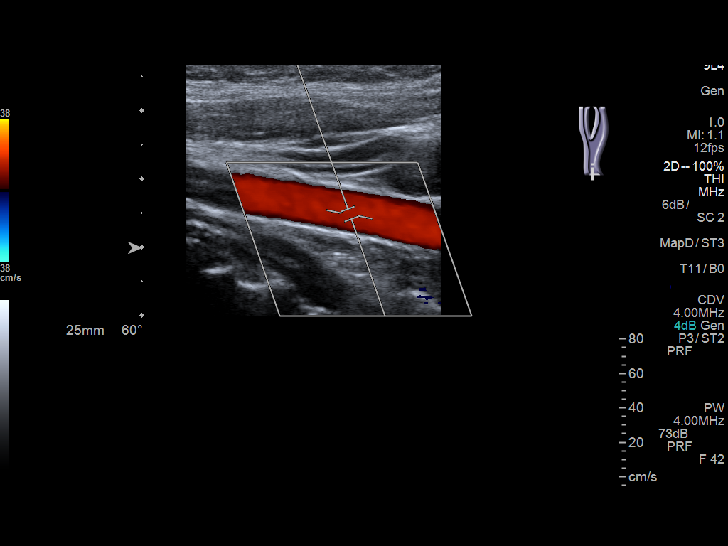
[im 42/64]
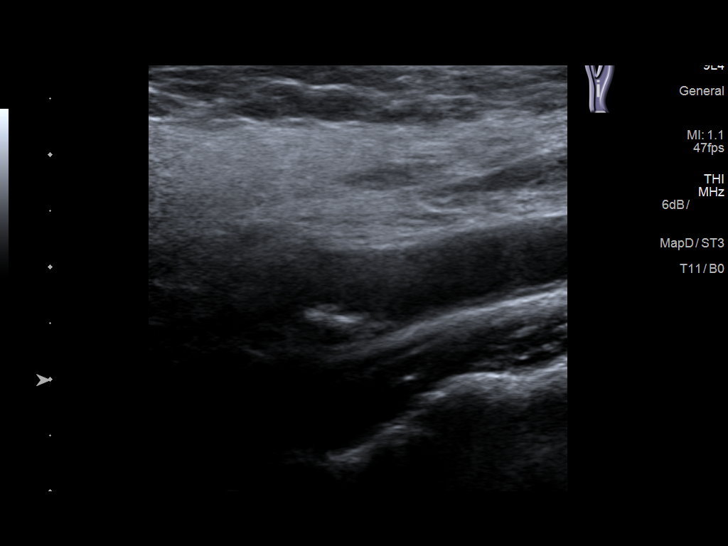
[im 47/64]
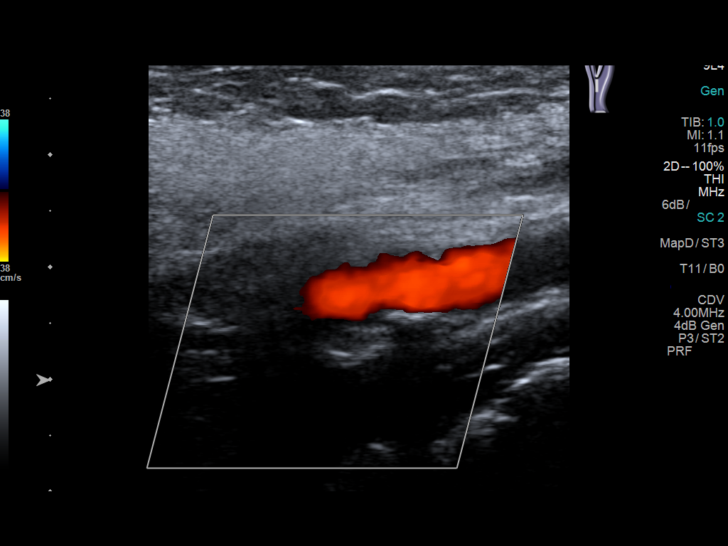
[im 53/64]
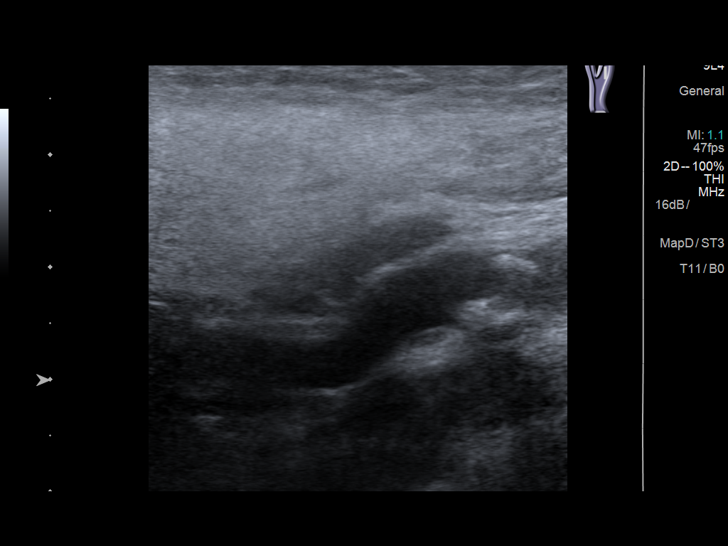
[im 58/64]
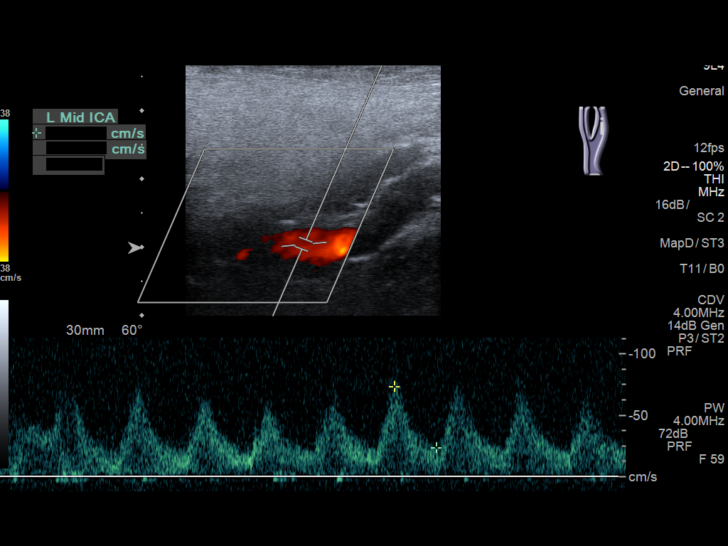
[im 64/64]
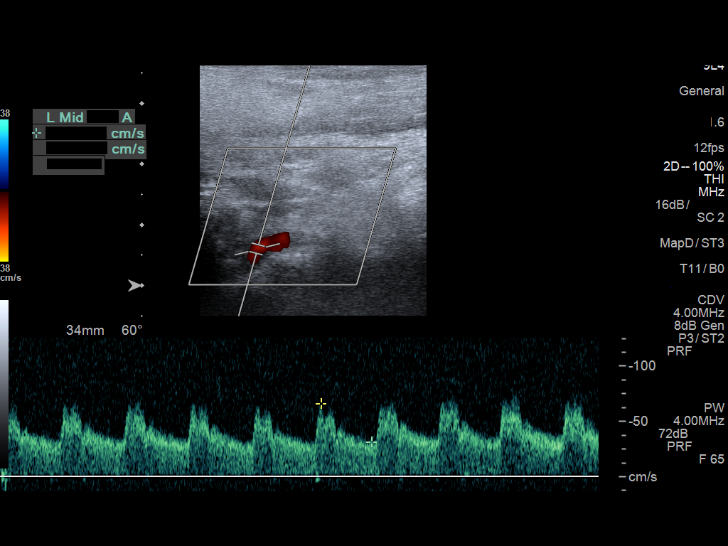

[13 of 24 positions shown; findings below may reference images not displayed]

FINDINGS: Criteria: Quantification of carotid stenosis is based on velocity
parameters that correlate the residual internal carotid diameter
with NASCET-based stenosis levels, using the diameter of the distal
internal carotid lumen as the denominator for stenosis measurement.

The following velocity measurements were obtained:

RIGHT
ICA: 82 cm/sec
CCA: 68 cm/sec

SYSTOLIC ICA/CCA RATIO:

ECA:  93 cm/sec

LEFT

ICA: 111 cm/sec

CCA: 76 cm/sec

SYSTOLIC ICA/CCA RATIO:

ECA:  76 cm/sec

RIGHT CAROTID ARTERY: Echogenic plaque in the distal common carotid
artery and bulb. Plaque near the origin of the external carotid
artery. External carotid artery is patent with normal waveform.
Normal waveforms and velocities in the internal carotid artery.

RIGHT VERTEBRAL ARTERY: Antegrade flow and normal waveform in the
right vertebral artery.

LEFT CAROTID ARTERY: Echogenic plaque at the left carotid bulb.
External carotid artery is patent with normal waveform. Normal
waveforms and velocities in the internal carotid artery.

LEFT VERTEBRAL ARTERY: Antegrade flow and normal waveform in the
left vertebral artery.
IMPRESSION: Mild atherosclerotic disease in the carotid arteries, predominantly
at the carotid bulbs. Estimated degree of stenosis in the internal
carotid arteries is less than 50% bilaterally.

Patent vertebral arteries with antegrade flow.

## 2017-08-10 DIAGNOSIS — S2243XD Multiple fractures of ribs, bilateral, subsequent encounter for fracture with routine healing: Secondary | ICD-10-CM | POA: Insufficient documentation

## 2018-04-14 ENCOUNTER — Other Ambulatory Visit: Payer: Self-pay

## 2018-04-16 NOTE — Discharge Instructions (Signed)

## 2018-04-19 ENCOUNTER — Encounter
Admission: RE | Disposition: A | Discharge status: Home / Self Care | Payer: Self-pay | Source: Ambulatory Visit | Attending: Ophthalmology

## 2018-04-19 ENCOUNTER — Ambulatory Visit: Payer: Medicaid Other | Admitting: Anesthesiology

## 2018-04-19 ENCOUNTER — Ambulatory Visit
Admission: RE | Admit: 2018-04-19 | Discharge: 2018-04-19 | Disposition: A | Discharge status: Home / Self Care | Payer: Medicaid Other | Source: Ambulatory Visit | Attending: Ophthalmology | Admitting: Ophthalmology

## 2018-04-19 DIAGNOSIS — H2512 Age-related nuclear cataract, left eye: Secondary | ICD-10-CM | POA: Diagnosis not present

## 2018-04-19 DIAGNOSIS — F17200 Nicotine dependence, unspecified, uncomplicated: Secondary | ICD-10-CM | POA: Insufficient documentation

## 2018-04-19 DIAGNOSIS — F329 Major depressive disorder, single episode, unspecified: Secondary | ICD-10-CM | POA: Diagnosis not present

## 2018-04-19 DIAGNOSIS — J449 Chronic obstructive pulmonary disease, unspecified: Secondary | ICD-10-CM | POA: Diagnosis not present

## 2018-04-19 HISTORY — DX: Dyspnea, unspecified: R06.00

## 2018-04-19 HISTORY — DX: Headache, unspecified: R51.9

## 2018-04-19 HISTORY — DX: Major depressive disorder, single episode, unspecified: F32.9

## 2018-04-19 HISTORY — DX: Benign neoplasm of left kidney: D30.02

## 2018-04-19 HISTORY — DX: Headache: R51

## 2018-04-19 HISTORY — DX: Unspecified asthma, uncomplicated: J45.909

## 2018-04-19 HISTORY — DX: Chronic obstructive pulmonary disease, unspecified: J44.9

## 2018-04-19 HISTORY — DX: Depression, unspecified: F32.A

## 2018-04-19 HISTORY — PX: CATARACT EXTRACTION W/PHACO: SHX586

## 2018-04-19 HISTORY — DX: Unspecified osteoarthritis, unspecified site: M19.90

## 2018-04-19 HISTORY — DX: Bitten by nonvenomous snake, initial encounter: W59.11XA

## 2018-04-19 HISTORY — DX: Pneumonia, unspecified organism: J18.9

## 2018-04-19 HISTORY — DX: Bell's palsy: G51.0

## 2018-04-19 HISTORY — DX: Dorsalgia, unspecified: M54.9

## 2018-04-19 SURGERY — PHACOEMULSIFICATION, CATARACT, WITH IOL INSERTION
Anesthesia: Monitor Anesthesia Care | Site: Eye | Laterality: Left | Wound class: "Clean "

## 2018-04-19 MED ORDER — EPINEPHRINE PF 1 MG/ML IJ SOLN
INTRAOCULAR | Status: DC | PRN
  Administered 2018-04-19: 55 mL via OPHTHALMIC

## 2018-04-19 MED ORDER — MOXIFLOXACIN HCL 0.5 % OP SOLN
1.0000 [drp] | OPHTHALMIC | Status: DC | PRN
  Administered 2018-04-19 (×3): 1 [drp] via OPHTHALMIC

## 2018-04-19 MED ORDER — ACETAMINOPHEN 325 MG PO TABS: 325.0000 mg | ORAL_TABLET | Freq: Once | ORAL | Status: DC

## 2018-04-19 MED ORDER — FENTANYL CITRATE (PF) 100 MCG/2ML IJ SOLN
INTRAMUSCULAR | Status: DC | PRN
  Administered 2018-04-19 (×2): 50 ug via INTRAVENOUS

## 2018-04-19 MED ORDER — ACETAMINOPHEN 160 MG/5ML PO SOLN: 325.0000 mg | Freq: Once | ORAL | Status: DC

## 2018-04-19 MED ORDER — MIDAZOLAM HCL 2 MG/2ML IJ SOLN
INTRAMUSCULAR | Status: DC | PRN
  Administered 2018-04-19 (×2): 1 mg via INTRAVENOUS

## 2018-04-19 MED ORDER — LACTATED RINGERS IV SOLN: 10.0000 mL/h | INTRAVENOUS | Status: DC

## 2018-04-19 MED ORDER — NA HYALUR & NA CHOND-NA HYALUR 0.4-0.35 ML IO KIT
PACK | INTRAOCULAR | Status: DC | PRN
  Administered 2018-04-19: 1 mL via INTRAOCULAR

## 2018-04-19 MED ORDER — CEFUROXIME OPHTHALMIC INJECTION 1 MG/0.1 ML
INJECTION | OPHTHALMIC | Status: DC | PRN
  Administered 2018-04-19: .3 mL via OPHTHALMIC

## 2018-04-19 MED ORDER — BRIMONIDINE TARTRATE-TIMOLOL 0.2-0.5 % OP SOLN
OPHTHALMIC | Status: DC | PRN
  Administered 2018-04-19: 2 [drp] via OPHTHALMIC

## 2018-04-19 MED ORDER — LIDOCAINE HCL (PF) 2 % IJ SOLN
INTRAMUSCULAR | Status: DC | PRN
  Administered 2018-04-19: 1 mL via INTRAMUSCULAR

## 2018-04-19 MED ORDER — LACTATED RINGERS IV SOLN: INTRAVENOUS | Status: DC

## 2018-04-19 MED ORDER — ARMC OPHTHALMIC DILATING DROPS
1.0000 "application " | OPHTHALMIC | Status: DC | PRN
  Administered 2018-04-19 (×3): 1 via OPHTHALMIC

## 2018-04-19 SURGICAL SUPPLY — 27 items

## 2018-04-19 NOTE — Anesthesia Preprocedure Evaluation (Addendum)
Anesthesia Evaluation  Patient identified by MRN, date of birth, ID band Patient awake    Reviewed: Allergy & Precautions, H&P , NPO status , Patient's Chart, lab work & pertinent test results  Airway Mallampati: II  TM Distance: >3 FB Neck ROM: full    Dental  (+) Edentulous Lower, Edentulous Upper   Pulmonary asthma , COPD, Current Smoker,    + rhonchi  + wheezing      Cardiovascular Normal cardiovascular exam Rhythm:regular Rate:Normal     Neuro/Psych    GI/Hepatic   Endo/Other  Morbid obesity  Renal/GU      Musculoskeletal   Abdominal   Peds  Hematology   Anesthesia Other Findings   Reproductive/Obstetrics                            Anesthesia Physical Anesthesia Plan  ASA: III  Anesthesia Plan: MAC   Post-op Pain Management:    Induction:   PONV Risk Score and Plan: 2 and Treatment may vary due to age or medical condition and Midazolam  Airway Management Planned:   Additional Equipment:   Intra-op Plan:   Post-operative Plan:   Informed Consent: I have reviewed the patients History and Physical, chart, labs and discussed the procedure including the risks, benefits and alternatives for the proposed anesthesia with the patient or authorized representative who has indicated his/her understanding and acceptance.     Plan Discussed with: CRNA  Anesthesia Plan Comments:         Anesthesia Quick Evaluation

## 2018-04-19 NOTE — Anesthesia Procedure Notes (Signed)
Procedure Name: MAC Date/Time: 04/19/2018 11:35 AM Performed by: Cameron Ali, CRNA Pre-anesthesia Checklist: Patient identified, Emergency Drugs available, Suction available, Timeout performed and Patient being monitored Patient Re-evaluated:Patient Re-evaluated prior to induction Oxygen Delivery Method: Nasal cannula Placement Confirmation: positive ETCO2

## 2018-04-19 NOTE — Op Note (Signed)
OPERATIVE NOTE  VERALYN LOPP [id number] 04/19/2018   PREOPERATIVE DIAGNOSIS:  Nuclear sclerotic cataract left eye. H25.12   POSTOPERATIVE DIAGNOSIS:    Nuclear sclerotic cataract left eye.     PROCEDURE:  Phacoemusification with posterior chamber intraocular lens placement of the left eye   LENS:   Implant Name Type Inv. Item Serial No. Manufacturer Lot No. LRB No. Used  LENS IOL DIOP 21.5 - [id number] Intraocular Lens LENS IOL DIOP 21.5 [id number]  Left 1        ULTRASOUND TIME: 16  % of 0 minutes 50 seconds, CDE 8.1  SURGEON:  Wyonia Hough, MD   ANESTHESIA:  Topical with tetracaine drops and 2% Xylocaine jelly, augmented with 1% preservative-free intracameral lidocaine.    COMPLICATIONS:  None.   DESCRIPTION OF PROCEDURE:  The patient was identified in the holding room and transported to the operating room and placed in the supine position under the operating microscope.  The left eye was identified as the operative eye and it was prepped and draped in the usual sterile ophthalmic fashion.   A 1 millimeter clear-corneal paracentesis was made at the 1:30 position.  0.5 ml of preservative-free 1% lidocaine was injected into the anterior chamber.  The anterior chamber was filled with Viscoat viscoelastic.  A 2.4 millimeter keratome was used to make a near-clear corneal incision at the 10:30 position.  .  A curvilinear capsulorrhexis was made with a cystotome and capsulorrhexis forceps.  Balanced salt solution was used to hydrodissect and hydrodelineate the nucleus.   Phacoemulsification was then used in stop and chop fashion to remove the lens nucleus and epinucleus.  The remaining cortex was then removed using the irrigation and aspiration handpiece. Provisc was then placed into the capsular bag to distend it for lens placement.  A lens was then injected into the capsular bag.  The remaining viscoelastic was aspirated.   Wounds were hydrated with balanced salt  solution.  The anterior chamber was inflated to a physiologic pressure with balanced salt solution.  No wound leaks were noted. Cefuroxime 0.1 ml of a 10mg /ml solution was injected into the anterior chamber for a dose of 1 mg of intracameral antibiotic at the completion of the case.   Timolol and Brimonidine drops were applied to the eye.  The patient was taken to the recovery room in stable condition without complications of anesthesia or surgery.  Sanae Willetts 04/19/2018, 11:52 AM

## 2018-04-19 NOTE — H&P (Signed)
The History and Physical notes are on paper, have been signed, and are to be scanned. The patient remains stable and unchanged from the H&P.   Previous H&P reviewed, patient examined, and there are no changes.  Cephus Tupy 04/19/2018 11:00 AM

## 2018-04-19 NOTE — Transfer of Care (Signed)
Immediate Anesthesia Transfer of Care Note  Patient: Sally Hughes  Procedure(s) Performed: CATARACT EXTRACTION PHACO AND INTRAOCULAR LENS PLACEMENT (IOC) LEFT IVA TOPICAL (Left Eye)  Patient Location: PACU  Anesthesia Type: MAC  Level of Consciousness: awake, alert  and patient cooperative  Airway and Oxygen Therapy: Patient Spontanous Breathing and Patient connected to supplemental oxygen  Post-op Assessment: Post-op Vital signs reviewed, Patient's Cardiovascular Status Stable, Respiratory Function Stable, Patent Airway and No signs of Nausea or vomiting  Post-op Vital Signs: Reviewed and stable  Complications: No apparent anesthesia complications

## 2018-04-19 NOTE — Anesthesia Postprocedure Evaluation (Signed)
Anesthesia Post Note  Patient: Sally Hughes  Procedure(s) Performed: CATARACT EXTRACTION PHACO AND INTRAOCULAR LENS PLACEMENT (IOC) LEFT IVA TOPICAL (Left Eye)  Patient location during evaluation: PACU Anesthesia Type: MAC Level of consciousness: awake and alert and oriented Pain management: satisfactory to patient Vital Signs Assessment: post-procedure vital signs reviewed and stable Respiratory status: spontaneous breathing, nonlabored ventilation and respiratory function stable Cardiovascular status: blood pressure returned to baseline and stable Postop Assessment: Adequate PO intake and No signs of nausea or vomiting Anesthetic complications: no    Raliegh Ip

## 2018-04-20 ENCOUNTER — Encounter: Payer: Self-pay | Admitting: Ophthalmology

## 2018-05-10 NOTE — Discharge Instructions (Signed)

## 2018-05-19 ENCOUNTER — Ambulatory Visit: Payer: Medicaid Other | Admitting: Anesthesiology

## 2018-05-19 ENCOUNTER — Ambulatory Visit
Admission: RE | Admit: 2018-05-19 | Discharge: 2018-05-19 | Disposition: A | Discharge status: Home / Self Care | Payer: Medicaid Other | Source: Ambulatory Visit | Attending: Ophthalmology | Admitting: Ophthalmology

## 2018-05-19 ENCOUNTER — Encounter
Admission: RE | Disposition: A | Discharge status: Home / Self Care | Payer: Self-pay | Source: Ambulatory Visit | Attending: Ophthalmology

## 2018-05-19 DIAGNOSIS — F329 Major depressive disorder, single episode, unspecified: Secondary | ICD-10-CM | POA: Insufficient documentation

## 2018-05-19 DIAGNOSIS — F17200 Nicotine dependence, unspecified, uncomplicated: Secondary | ICD-10-CM | POA: Insufficient documentation

## 2018-05-19 DIAGNOSIS — Z79899 Other long term (current) drug therapy: Secondary | ICD-10-CM | POA: Insufficient documentation

## 2018-05-19 DIAGNOSIS — J449 Chronic obstructive pulmonary disease, unspecified: Secondary | ICD-10-CM | POA: Diagnosis not present

## 2018-05-19 DIAGNOSIS — H2511 Age-related nuclear cataract, right eye: Secondary | ICD-10-CM | POA: Insufficient documentation

## 2018-05-19 HISTORY — PX: CATARACT EXTRACTION W/PHACO: SHX586

## 2018-05-19 SURGERY — PHACOEMULSIFICATION, CATARACT, WITH IOL INSERTION
Anesthesia: Monitor Anesthesia Care | Site: Eye | Laterality: Right | Wound class: "Clean "

## 2018-05-19 MED ORDER — MOXIFLOXACIN HCL 0.5 % OP SOLN
1.0000 [drp] | OPHTHALMIC | Status: DC | PRN
  Administered 2018-05-19 (×3): 1 [drp] via OPHTHALMIC

## 2018-05-19 MED ORDER — TETRACAINE HCL 0.5 % OP SOLN
1.0000 [drp] | OPHTHALMIC | Status: DC | PRN
Start: 2018-05-19 — End: 2018-05-19
  Administered 2018-05-19 (×2): 1 [drp] via OPHTHALMIC

## 2018-05-19 MED ORDER — CEFUROXIME OPHTHALMIC INJECTION 1 MG/0.1 ML
INJECTION | OPHTHALMIC | Status: DC | PRN
  Administered 2018-05-19: 0.1 mL via INTRACAMERAL

## 2018-05-19 MED ORDER — CYCLOPENTOLATE HCL 2 % OP SOLN
1.0000 [drp] | OPHTHALMIC | Status: DC | PRN
  Administered 2018-05-19 (×4): 1 [drp] via OPHTHALMIC

## 2018-05-19 MED ORDER — MIDAZOLAM HCL 2 MG/2ML IJ SOLN
INTRAMUSCULAR | Status: DC | PRN
  Administered 2018-05-19: 2 mg via INTRAVENOUS

## 2018-05-19 MED ORDER — EPINEPHRINE PF 1 MG/ML IJ SOLN
INTRAOCULAR | Status: DC | PRN
  Administered 2018-05-19: 35 mL via OPHTHALMIC

## 2018-05-19 MED ORDER — LIDOCAINE HCL (PF) 2 % IJ SOLN
INTRAOCULAR | Status: DC | PRN
  Administered 2018-05-19: 1 mL via INTRAMUSCULAR

## 2018-05-19 MED ORDER — ACETAMINOPHEN 160 MG/5ML PO SOLN: 325.0000 mg | ORAL | Status: DC | PRN

## 2018-05-19 MED ORDER — ACETAMINOPHEN 325 MG PO TABS: 325.0000 mg | ORAL_TABLET | ORAL | Status: DC | PRN

## 2018-05-19 MED ORDER — NA HYALUR & NA CHOND-NA HYALUR 0.4-0.35 ML IO KIT
PACK | INTRAOCULAR | Status: DC | PRN
  Administered 2018-05-19: 1 mL via INTRAOCULAR

## 2018-05-19 MED ORDER — PHENYLEPHRINE HCL 10 % OP SOLN
1.0000 [drp] | OPHTHALMIC | Status: DC | PRN
  Administered 2018-05-19 (×4): 1 [drp] via OPHTHALMIC

## 2018-05-19 MED ORDER — LACTATED RINGERS IV SOLN: 1000.0000 mL | INTRAVENOUS | Status: DC

## 2018-05-19 MED ORDER — FENTANYL CITRATE (PF) 100 MCG/2ML IJ SOLN
INTRAMUSCULAR | Status: DC | PRN
  Administered 2018-05-19: 50 ug via INTRAVENOUS

## 2018-05-19 MED ORDER — BRIMONIDINE TARTRATE-TIMOLOL 0.2-0.5 % OP SOLN
OPHTHALMIC | Status: DC | PRN
  Administered 2018-05-19: 1 [drp] via OPHTHALMIC

## 2018-05-19 SURGICAL SUPPLY — 27 items

## 2018-05-19 NOTE — Anesthesia Procedure Notes (Signed)
Procedure Name: MAC Date/Time: 05/19/2018 9:44 AM Performed by: Janna Arch, CRNA Pre-anesthesia Checklist: Patient identified, Emergency Drugs available, Suction available and Patient being monitored Patient Re-evaluated:Patient Re-evaluated prior to induction Oxygen Delivery Method: Nasal cannula

## 2018-05-19 NOTE — Anesthesia Preprocedure Evaluation (Signed)
Anesthesia Evaluation  Patient identified by MRN, date of birth, ID band Patient awake    Reviewed: Allergy & Precautions, H&P , NPO status , Patient's Chart, lab work & pertinent test results, reviewed documented beta blocker date and time   Airway Mallampati: I  TM Distance: >3 FB Neck ROM: full    Dental  (+) Edentulous Upper, Edentulous Lower   Pulmonary shortness of breath, asthma , COPD,  COPD inhaler, Current Smoker,    Pulmonary exam normal breath sounds clear to auscultation       Cardiovascular Exercise Tolerance: Good negative cardio ROS Normal cardiovascular exam Rhythm:regular Rate:Normal     Neuro/Psych negative neurological ROS  negative psych ROS   GI/Hepatic negative GI ROS, Neg liver ROS,   Endo/Other  negative endocrine ROS  Renal/GU negative Renal ROS  negative genitourinary   Musculoskeletal   Abdominal   Peds  Hematology negative hematology ROS (+)   Anesthesia Other Findings   Reproductive/Obstetrics negative OB ROS                             Anesthesia Physical Anesthesia Plan  ASA: II  Anesthesia Plan: MAC   Post-op Pain Management:    Induction:   PONV Risk Score and Plan:   Airway Management Planned:   Additional Equipment:   Intra-op Plan:   Post-operative Plan:   Informed Consent: I have reviewed the patients History and Physical, chart, labs and discussed the procedure including the risks, benefits and alternatives for the proposed anesthesia with the patient or authorized representative who has indicated his/her understanding and acceptance.   Dental Advisory Given  Plan Discussed with: CRNA  Anesthesia Plan Comments:         Anesthesia Quick Evaluation

## 2018-05-19 NOTE — Op Note (Signed)
LOCATION:  [place]   PREOPERATIVE DIAGNOSIS:    Nuclear sclerotic cataract right eye. H25.11   POSTOPERATIVE DIAGNOSIS:  Nuclear sclerotic cataract right eye.     PROCEDURE:  Phacoemusification with posterior chamber intraocular lens placement of the right eye   LENS:   Implant Name Type Inv. Item Serial No. Manufacturer Lot No. LRB No. Used  LENS IOL DIOP 21.5 - [id number] Intraocular Lens LENS IOL DIOP 21.5 [id number]  Right 1        ULTRASOUND TIME: 17 % of 0 minutes, 32 seconds.  CDE 5.6   SURGEON:  Wyonia Hough, MD   ANESTHESIA:  Topical with tetracaine drops and 2% Xylocaine jelly, augmented with 1% preservative-free intracameral lidocaine.    COMPLICATIONS:  None.   DESCRIPTION OF PROCEDURE:  The patient was identified in the holding room and transported to the operating room and placed in the supine position under the operating microscope.  The right eye was identified as the operative eye and it was prepped and draped in the usual sterile ophthalmic fashion.   A 1 millimeter clear-corneal paracentesis was made at the 12:00 position.  0.5 ml of preservative-free 1% lidocaine was injected into the anterior chamber. The anterior chamber was filled with Viscoat viscoelastic.  A 2.4 millimeter keratome was used to make a near-clear corneal incision at the 9:00 position.  A curvilinear capsulorrhexis was made with a cystotome and capsulorrhexis forceps.  Balanced salt solution was used to hydrodissect and hydrodelineate the nucleus.   Phacoemulsification was then used in stop and chop fashion to remove the lens nucleus and epinucleus.  The remaining cortex was then removed using the irrigation and aspiration handpiece. Provisc was then placed into the capsular bag to distend it for lens placement.  A lens was then injected into the capsular bag.  The remaining viscoelastic was aspirated.   Wounds were hydrated with balanced salt solution.  The anterior  chamber was inflated to a physiologic pressure with balanced salt solution.  No wound leaks were noted. Cefuroxime 0.1 ml of a 10mg /ml solution was injected into the anterior chamber for a dose of 1 mg of intracameral antibiotic at the completion of the case.   Timolol and Brimonidine drops were applied to the eye.  The patient was taken to the recovery room in stable condition without complications of anesthesia or surgery.   Dashae Wilcher 05/19/2018, 10:00 AM

## 2018-05-19 NOTE — H&P (Signed)
The History and Physical notes are on paper, have been signed, and are to be scanned. The patient remains stable and unchanged from the H&P.   Previous H&P reviewed, patient examined, and there are no changes.  Sally Hughes 05/19/2018 8:44 AM

## 2018-05-19 NOTE — Anesthesia Postprocedure Evaluation (Signed)
Anesthesia Post Note  Patient: Sally Hughes  Procedure(s) Performed: CATARACT EXTRACTION PHACO AND INTRAOCULAR LENS PLACEMENT (IOC)  RIGHT (Right Eye)  Patient location during evaluation: PACU Anesthesia Type: MAC Level of consciousness: awake and alert Pain management: pain level controlled Vital Signs Assessment: post-procedure vital signs reviewed and stable Respiratory status: spontaneous breathing, nonlabored ventilation, respiratory function stable and patient connected to nasal cannula oxygen Cardiovascular status: stable and blood pressure returned to baseline Postop Assessment: no apparent nausea or vomiting Anesthetic complications: no    Trecia Rogers

## 2018-05-19 NOTE — Transfer of Care (Signed)
Immediate Anesthesia Transfer of Care Note  Patient: Sally Hughes  Procedure(s) Performed: CATARACT EXTRACTION PHACO AND INTRAOCULAR LENS PLACEMENT (IOC)  RIGHT (Right Eye)  Patient Location: PACU  Anesthesia Type: MAC  Level of Consciousness: awake, alert  and patient cooperative  Airway and Oxygen Therapy: Patient Spontanous Breathing and Patient connected to supplemental oxygen  Post-op Assessment: Post-op Vital signs reviewed, Patient's Cardiovascular Status Stable, Respiratory Function Stable, Patent Airway and No signs of Nausea or vomiting  Post-op Vital Signs: Reviewed and stable  Complications: No apparent anesthesia complications

## 2018-05-20 ENCOUNTER — Encounter: Payer: Self-pay | Admitting: Ophthalmology

## 2018-06-05 ENCOUNTER — Other Ambulatory Visit: Payer: Self-pay

## 2018-06-05 ENCOUNTER — Emergency Department
Admission: EM | Admit: 2018-06-05 | Discharge: 2018-06-05 | Disposition: A | Discharge status: Home / Self Care | Payer: Medicaid Other | Attending: Emergency Medicine | Admitting: Emergency Medicine

## 2018-06-05 DIAGNOSIS — Y92009 Unspecified place in unspecified non-institutional (private) residence as the place of occurrence of the external cause: Secondary | ICD-10-CM | POA: Insufficient documentation

## 2018-06-05 DIAGNOSIS — W5311XA Bitten by rat, initial encounter: Secondary | ICD-10-CM | POA: Diagnosis not present

## 2018-06-05 DIAGNOSIS — J449 Chronic obstructive pulmonary disease, unspecified: Secondary | ICD-10-CM | POA: Insufficient documentation

## 2018-06-05 DIAGNOSIS — Y9389 Activity, other specified: Secondary | ICD-10-CM | POA: Insufficient documentation

## 2018-06-05 DIAGNOSIS — S6991XA Unspecified injury of right wrist, hand and finger(s), initial encounter: Secondary | ICD-10-CM | POA: Diagnosis present

## 2018-06-05 DIAGNOSIS — Z79899 Other long term (current) drug therapy: Secondary | ICD-10-CM | POA: Diagnosis not present

## 2018-06-05 DIAGNOSIS — S60410A Abrasion of right index finger, initial encounter: Secondary | ICD-10-CM | POA: Diagnosis not present

## 2018-06-05 DIAGNOSIS — Y998 Other external cause status: Secondary | ICD-10-CM | POA: Diagnosis not present

## 2018-06-05 DIAGNOSIS — Z23 Encounter for immunization: Secondary | ICD-10-CM | POA: Diagnosis not present

## 2018-06-05 DIAGNOSIS — F17210 Nicotine dependence, cigarettes, uncomplicated: Secondary | ICD-10-CM | POA: Insufficient documentation

## 2018-06-05 DIAGNOSIS — T148XXA Other injury of unspecified body region, initial encounter: Secondary | ICD-10-CM

## 2018-06-05 MED ORDER — CEPHALEXIN 500 MG PO CAPS: 500.0000 mg | ORAL_CAPSULE | Freq: Three times a day (TID) | ORAL | 0 refills | Status: DC

## 2018-06-05 MED ORDER — CEPHALEXIN 500 MG PO CAPS
500.0000 mg | ORAL_CAPSULE | Freq: Once | ORAL | Status: AC
  Administered 2018-06-05: 500 mg via ORAL
  Filled 2018-06-05: qty 1

## 2018-06-05 MED ORDER — TETANUS-DIPHTH-ACELL PERTUSSIS 5-2.5-18.5 LF-MCG/0.5 IM SUSP
0.5000 mL | Freq: Once | INTRAMUSCULAR | Status: AC
  Administered 2018-06-05: 0.5 mL via INTRAMUSCULAR
  Filled 2018-06-05: qty 0.5

## 2018-06-05 MED ORDER — IBUPROFEN 600 MG PO TABS
600.0000 mg | ORAL_TABLET | Freq: Once | ORAL | Status: AC
  Administered 2018-06-05: 600 mg via ORAL
  Filled 2018-06-05: qty 1

## 2018-06-05 NOTE — ED Provider Notes (Signed)
[facility] Emergency Department Provider Note   ____________________________________________   First MD Initiated Contact with Patient 06/05/18 [phone]     (approximate)  I have reviewed the triage vital signs and the nursing notes.   HISTORY  Chief Complaint Animal Bite    HPI Sally Hughes is a 57 y.o. female who presents to the ED from a friend's house with a chief complaint of "rat bite".  Patient has been living with a friend and has seen rats around the house.  She awoke prior to arrival to "something gnawing on my finger".  Complains of injury and pain to her right index finger.  Tetanus is not up-to-date.  Denies associated numbness/tingling.  Voices no other medical complaints.   Past Medical History:  Diagnosis Date  . Arthritis    whole body  . Asthma    inhaler in summer  . Back pain   . Bell's palsy    12 yrs ago, right eye weaker  . COPD (chronic obstructive pulmonary disease) (HCC)    wheezing  . Depression   . Dyspnea   . Headache    about everyday  . Kidney tumor (benign), left   . Pneumonia    in past  . Snake bite    as a child    There are no active problems to display for this patient.   Past Surgical History:  Procedure Laterality Date  . CATARACT EXTRACTION W/PHACO Left 04/19/2018   Procedure: CATARACT EXTRACTION PHACO AND INTRAOCULAR LENS PLACEMENT ([place]) LEFT IVA TOPICAL;  Surgeon: Leandrew Koyanagi, MD;  Location: [place];  Service: Ophthalmology;  Laterality: Left;  . CATARACT EXTRACTION W/PHACO Right 05/19/2018   Procedure: CATARACT EXTRACTION PHACO AND INTRAOCULAR LENS PLACEMENT ([place])  RIGHT;  Surgeon: Leandrew Koyanagi, MD;  Location: [address];  Service: Ophthalmology;  Laterality: Right;  . TUBAL LIGATION    . TUMOR REMOVAL     benign tumor removed left kidney    Prior to Admission medications   Medication Sig Start Date End Date Taking? Authorizing Provider  albuterol  (PROVENTIL HFA;VENTOLIN HFA) 108 (90 Base) MCG/ACT inhaler Inhale 2 puffs into the lungs every 6 (six) hours as needed for wheezing or shortness of breath.    [provider]  cephALEXin (KEFLEX) 500 MG capsule Take 1 capsule (500 mg total) by mouth 3 (three) times daily. 06/05/18   Paulette Blanch, MD  cyclobenzaprine (FLEXERIL) 10 MG tablet Take 1 tablet (10 mg total) by mouth 3 (three) times daily as needed. Patient not taking: Reported on 04/14/2018 04/07/17   Sable Feil, PA-C  gabapentin (NEURONTIN) 600 MG tablet Take 600 mg by mouth 3 (three) times daily.    [provider]  ibuprofen (ADVIL,MOTRIN) 600 MG tablet Take 1 tablet (600 mg total) by mouth every 8 (eight) hours as needed. Patient not taking: Reported on 04/14/2018 04/07/17   Sable Feil, PA-C  OVER THE COUNTER MEDICATION as needed. Goody powder    [provider]  oxyCODONE-acetaminophen (PERCOCET) 7.5-325 MG tablet Take 1 tablet by mouth every 6 (six) hours as needed for severe pain. Patient not taking: Reported on 04/14/2018 04/07/17   Sable Feil, PA-C    Allergies Patient has no known allergies.  No family history on file.  Social History Social History   Tobacco Use  . Smoking status: Current Every Day Smoker    Packs/day: 1.50    Years: 30.00    Pack years: 45.00  Types: Cigarettes  . Smokeless tobacco: Never Used  Substance Use Topics  . Alcohol use: No  . Drug use: Not Currently    Types: Marijuana    Comment: in past    Review of Systems  Constitutional: No fever/chills Eyes: No visual changes. ENT: No sore throat. Cardiovascular: Denies chest pain. Respiratory: Denies shortness of breath. Gastrointestinal: No abdominal pain.  No nausea, no vomiting.  No diarrhea.  No constipation. Genitourinary: Negative for dysuria. Musculoskeletal: Positive for right index finger injury.  Negative for back pain. Skin: Negative for rash. Neurological: Negative for headaches, focal  weakness or numbness.   ____________________________________________   PHYSICAL EXAM:  VITAL SIGNS: ED Triage Vitals  Enc Vitals Group     BP 06/05/18 0609 109/74     Pulse Rate 06/05/18 0609 82     Resp 06/05/18 0609 18     Temp 06/05/18 0609 (!) 97.4 F (36.3 C)     Temp Source 06/05/18 0609 Oral     SpO2 06/05/18 0609 100 %     Weight 06/05/18 0601 180 lb (81.6 kg)     Height 06/05/18 0601 5\' 3"  (1.6 m)     Head Circumference --      Peak Flow --      Pain Score 06/05/18 0601 8     Pain Loc --      Pain Edu? --      Excl. in [place]? --     Constitutional: Alert and oriented. Well appearing and in no acute distress. Eyes: Conjunctivae are normal. PERRL. EOMI. Head: Atraumatic. Nose: No congestion/rhinnorhea. Mouth/Throat: Mucous membranes are moist.  Oropharynx non-erythematous. Neck: No stridor.   Cardiovascular: Normal rate, regular rhythm. Grossly normal heart sounds.  Good peripheral circulation. Respiratory: Normal respiratory effort.  No retractions. Lungs CTAB. Gastrointestinal: Soft and nontender. No distention. No abdominal bruits. No CVA tenderness. Musculoskeletal: Right index finger with abrasion type injury to tip of finger pad adjacent to nail.  No associated nailbed injury.  No active bleeding.  2+ radial pulse.  Brisk, less than 5-second capillary refill.  No joint effusions. Neurologic:  Normal speech and language. No gross focal neurologic deficits are appreciated. No gait instability. Skin:  Skin is warm, dry and intact. No rash noted. Psychiatric: Mood and affect are normal. Speech and behavior are normal.  ____________________________________________   LABS (all labs ordered are listed, but only abnormal results are displayed)  Labs Reviewed - No data to display ____________________________________________  EKG  None ____________________________________________  RADIOLOGY  ED MD interpretation: None  Official radiology report(s): No  results found.  ____________________________________________   PROCEDURES  Procedure(s) performed: None  Procedures  Critical Care performed: No  ____________________________________________   INITIAL IMPRESSION / ASSESSMENT AND PLAN / ED COURSE  As part of my medical decision making, I reviewed the following data within the [place] notes reviewed and incorporated, Old chart reviewed and Notes from prior ED visits   57 year old female who presents with right finger injury possibly secondary to rat bite.  Will cleanse wound, placed on empiric antibiotic (Keflex), update tetanus.  Discussed with patient there is extremely low risk for rabies and there is no need at this time to start rabies series.  Strict return precautions given.  Patient verbalizes understanding and agrees with plan of care.      ____________________________________________   FINAL CLINICAL IMPRESSION(S) / ED DIAGNOSES  Final diagnoses:  Animal bite     ED Discharge Orders  Ordered    cephALEXin (KEFLEX) 500 MG capsule  3 times daily     06/05/18 0211       Note:  This document was prepared using Dragon voice recognition software and may include unintentional dictation errors.    Paulette Blanch, MD 06/05/18 [phone]

## 2018-06-05 NOTE — ED Triage Notes (Signed)
Pt reports she was awakened by a puncture to her right index finger and thinks she was bit by a rat.

## 2018-06-05 NOTE — ED Notes (Signed)
Pt reports waking to a "rat bite" and blood everywhere, pt reports that the house pt is living in is infested with rats   Pt unable to recall last TDAP  Right index finger tip appears lacerated and well approximately, bleeding controlled

## 2018-06-05 NOTE — Discharge Instructions (Signed)
1.  Take antibiotic as prescribed (Keflex 500 mg 3 times daily x7 days). 2.  Your tetanus has been updated and will be good for the next 10 years. 3.  Return to the ER for worsening symptoms, increased redness/swelling, purulent discharge or other concerns.

## 2018-06-10 ENCOUNTER — Other Ambulatory Visit: Payer: Self-pay | Admitting: Physician Assistant

## 2018-06-10 DIAGNOSIS — I739 Peripheral vascular disease, unspecified: Principal | ICD-10-CM

## 2018-06-10 DIAGNOSIS — I779 Disorder of arteries and arterioles, unspecified: Secondary | ICD-10-CM

## 2018-06-15 ENCOUNTER — Ambulatory Visit
Admission: RE | Admit: 2018-06-15 | Discharge: 2018-06-15 | Disposition: A | Discharge status: Home / Self Care | Payer: Medicaid Other | Source: Ambulatory Visit | Attending: Physician Assistant | Admitting: Physician Assistant

## 2018-06-15 DIAGNOSIS — I739 Peripheral vascular disease, unspecified: Secondary | ICD-10-CM

## 2018-06-15 DIAGNOSIS — I779 Disorder of arteries and arterioles, unspecified: Secondary | ICD-10-CM | POA: Insufficient documentation

## 2018-06-15 DIAGNOSIS — I6523 Occlusion and stenosis of bilateral carotid arteries: Secondary | ICD-10-CM | POA: Diagnosis not present

## 2018-08-14 ENCOUNTER — Other Ambulatory Visit: Payer: Self-pay

## 2018-08-14 ENCOUNTER — Emergency Department
Admission: EM | Admit: 2018-08-14 | Discharge: 2018-08-14 | Disposition: A | Discharge status: Home / Self Care | Payer: Medicaid Other | Attending: Emergency Medicine | Admitting: Emergency Medicine

## 2018-08-14 ENCOUNTER — Emergency Department: Payer: Medicaid Other

## 2018-08-14 ENCOUNTER — Encounter: Payer: Self-pay | Admitting: Emergency Medicine

## 2018-08-14 DIAGNOSIS — Y92410 Unspecified street and highway as the place of occurrence of the external cause: Secondary | ICD-10-CM | POA: Diagnosis not present

## 2018-08-14 DIAGNOSIS — M25511 Pain in right shoulder: Secondary | ICD-10-CM

## 2018-08-14 DIAGNOSIS — Z79899 Other long term (current) drug therapy: Secondary | ICD-10-CM | POA: Diagnosis not present

## 2018-08-14 DIAGNOSIS — G44319 Acute post-traumatic headache, not intractable: Secondary | ICD-10-CM | POA: Diagnosis not present

## 2018-08-14 DIAGNOSIS — Y999 Unspecified external cause status: Secondary | ICD-10-CM | POA: Insufficient documentation

## 2018-08-14 DIAGNOSIS — F17210 Nicotine dependence, cigarettes, uncomplicated: Secondary | ICD-10-CM | POA: Insufficient documentation

## 2018-08-14 DIAGNOSIS — M542 Cervicalgia: Secondary | ICD-10-CM | POA: Insufficient documentation

## 2018-08-14 DIAGNOSIS — M25512 Pain in left shoulder: Secondary | ICD-10-CM | POA: Insufficient documentation

## 2018-08-14 DIAGNOSIS — J45909 Unspecified asthma, uncomplicated: Secondary | ICD-10-CM | POA: Diagnosis not present

## 2018-08-14 DIAGNOSIS — S0990XA Unspecified injury of head, initial encounter: Secondary | ICD-10-CM | POA: Diagnosis present

## 2018-08-14 DIAGNOSIS — Y9389 Activity, other specified: Secondary | ICD-10-CM | POA: Diagnosis not present

## 2018-08-14 DIAGNOSIS — M546 Pain in thoracic spine: Secondary | ICD-10-CM

## 2018-08-14 DIAGNOSIS — S0083XA Contusion of other part of head, initial encounter: Secondary | ICD-10-CM | POA: Insufficient documentation

## 2018-08-14 HISTORY — DX: Unspecified viral hepatitis C without hepatic coma: B19.20
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
## 2018-08-14 MED ORDER — ORPHENADRINE CITRATE ER 100 MG PO TB12: 100.0000 mg | ORAL_TABLET | Freq: Two times a day (BID) | ORAL | 0 refills | Status: AC

## 2018-08-14 MED ORDER — ORPHENADRINE CITRATE 30 MG/ML IJ SOLN
60.0000 mg | Freq: Once | INTRAMUSCULAR | Status: AC
  Administered 2018-08-14: 60 mg via INTRAMUSCULAR
  Filled 2018-08-14: qty 2

## 2018-08-14 MED ORDER — NAPROXEN 500 MG PO TBEC: 500.0000 mg | DELAYED_RELEASE_TABLET | Freq: Two times a day (BID) | ORAL | 0 refills | Status: DC

## 2018-08-14 MED ORDER — HYDROCODONE-ACETAMINOPHEN 5-325 MG PO TABS
1.0000 | ORAL_TABLET | Freq: Once | ORAL | Status: AC
  Administered 2018-08-14: 1 via ORAL
  Filled 2018-08-14: qty 1

## 2018-08-14 NOTE — Discharge Instructions (Addendum)
Your CT head and neck were negative for any acute findings. The xray of your clavicles and thoracic spine were normal. I have provided you with a RX for Naproxen 500 mg 2 x day for 1 week. Consume with food and avoid other NSAID's OTC. I have provided you with a RX for Norflex 100 mg 2 x day for 1 week. This may cause sedation. Heat and stretching may be helpful.
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
## 2018-09-09 ENCOUNTER — Encounter: Payer: Self-pay | Admitting: Emergency Medicine

## 2018-09-09 ENCOUNTER — Emergency Department: Payer: Medicaid Other

## 2018-09-09 ENCOUNTER — Emergency Department
Admission: EM | Admit: 2018-09-09 | Discharge: 2018-09-09 | Disposition: A | Discharge status: Home / Self Care | Payer: Medicaid Other | Attending: Emergency Medicine | Admitting: Emergency Medicine

## 2018-09-09 ENCOUNTER — Other Ambulatory Visit: Payer: Self-pay

## 2018-09-09 DIAGNOSIS — S3991XA Unspecified injury of abdomen, initial encounter: Secondary | ICD-10-CM

## 2018-09-09 DIAGNOSIS — W19XXXA Unspecified fall, initial encounter: Secondary | ICD-10-CM

## 2018-09-09 DIAGNOSIS — Y929 Unspecified place or not applicable: Secondary | ICD-10-CM | POA: Insufficient documentation

## 2018-09-09 DIAGNOSIS — S0990XA Unspecified injury of head, initial encounter: Secondary | ICD-10-CM | POA: Diagnosis not present

## 2018-09-09 DIAGNOSIS — Y999 Unspecified external cause status: Secondary | ICD-10-CM | POA: Insufficient documentation

## 2018-09-09 DIAGNOSIS — Y93E1 Activity, personal bathing and showering: Secondary | ICD-10-CM | POA: Diagnosis not present

## 2018-09-09 DIAGNOSIS — W182XXA Fall in (into) shower or empty bathtub, initial encounter: Secondary | ICD-10-CM | POA: Insufficient documentation

## 2018-09-09 DIAGNOSIS — S59902A Unspecified injury of left elbow, initial encounter: Secondary | ICD-10-CM | POA: Diagnosis present

## 2018-09-09 DIAGNOSIS — S5002XA Contusion of left elbow, initial encounter: Secondary | ICD-10-CM | POA: Diagnosis not present

## 2018-09-09 DIAGNOSIS — F17210 Nicotine dependence, cigarettes, uncomplicated: Secondary | ICD-10-CM | POA: Insufficient documentation

## 2018-09-09 DIAGNOSIS — J45909 Unspecified asthma, uncomplicated: Secondary | ICD-10-CM | POA: Insufficient documentation

## 2018-09-09 DIAGNOSIS — Z79899 Other long term (current) drug therapy: Secondary | ICD-10-CM | POA: Insufficient documentation

## 2018-09-09 LAB — URINE DRUG SCREEN, QUALITATIVE (ARMC ONLY)
Amphetamines, Ur Screen: NOT DETECTED
BARBITURATES, UR SCREEN: NOT DETECTED
Benzodiazepine, Ur Scrn: POSITIVE — AB
CANNABINOID 50 NG, UR ~~LOC~~: POSITIVE — AB
COCAINE METABOLITE, UR ~~LOC~~: NOT DETECTED
MDMA (Ecstasy)Ur Screen: NOT DETECTED
Methadone Scn, Ur: NOT DETECTED
Opiate, Ur Screen: NOT DETECTED
PHENCYCLIDINE (PCP) UR S: NOT DETECTED
TRICYCLIC, UR SCREEN: POSITIVE — AB

## 2018-09-09 LAB — COMPREHENSIVE METABOLIC PANEL
ALT: 11 U/L (ref 0–44)
AST: 17 U/L (ref 15–41)
Albumin: 3.6 g/dL (ref 3.5–5.0)
Alkaline Phosphatase: 92 U/L (ref 38–126)
Anion gap: 5 (ref 5–15)
BILIRUBIN TOTAL: 0.5 mg/dL (ref 0.3–1.2)
BUN: 10 mg/dL (ref 6–20)
CHLORIDE: 110 mmol/L (ref 98–111)
CO2: 27 mmol/L (ref 22–32)
CREATININE: 0.68 mg/dL (ref 0.44–1.00)
Calcium: 9.1 mg/dL (ref 8.9–10.3)
Glucose, Bld: 102 mg/dL — ABNORMAL HIGH (ref 70–99)
POTASSIUM: 3.9 mmol/L (ref 3.5–5.1)
Sodium: 142 mmol/L (ref 135–145)
TOTAL PROTEIN: 7.5 g/dL (ref 6.5–8.1)

## 2018-09-09 LAB — URINALYSIS, COMPLETE (UACMP) WITH MICROSCOPIC
BILIRUBIN URINE: NEGATIVE
Bacteria, UA: NONE SEEN
Glucose, UA: NEGATIVE mg/dL
Ketones, ur: NEGATIVE mg/dL
LEUKOCYTES UA: NEGATIVE
NITRITE: NEGATIVE
Protein, ur: NEGATIVE mg/dL
SPECIFIC GRAVITY, URINE: 1.04 — AB (ref 1.005–1.030)
pH: 5 (ref 5.0–8.0)

## 2018-09-09 LAB — CBC WITH DIFFERENTIAL/PLATELET
Abs Immature Granulocytes: 0.03 10*3/uL (ref 0.00–0.07)
BASOS ABS: 0 10*3/uL (ref 0.0–0.1)
BASOS PCT: 1 %
EOS PCT: 3 %
Eosinophils Absolute: 0.2 10*3/uL (ref 0.0–0.5)
HCT: 41.4 % (ref 36.0–46.0)
Hemoglobin: 13.3 g/dL (ref 12.0–15.0)
Immature Granulocytes: 0 %
LYMPHS PCT: 35 %
Lymphs Abs: 2.8 10*3/uL (ref 0.7–4.0)
MCH: 31.1 pg (ref 26.0–34.0)
MCHC: 32.1 g/dL (ref 30.0–36.0)
MCV: 96.7 fL (ref 80.0–100.0)
MONO ABS: 0.5 10*3/uL (ref 0.1–1.0)
Monocytes Relative: 7 %
NEUTROS ABS: 4.4 10*3/uL (ref 1.7–7.7)
NRBC: 0 % (ref 0.0–0.2)
Neutrophils Relative %: 54 %
PLATELETS: 289 10*3/uL (ref 150–400)
RBC: 4.28 MIL/uL (ref 3.87–5.11)
RDW: 13.2 % (ref 11.5–15.5)
WBC: 8 10*3/uL (ref 4.0–10.5)

## 2018-09-09 IMAGING — DX DG ELBOW COMPLETE 3+V*L*
4 series · 4 of 4 positions shown · non-contrast
Comparison: [DATE]

CLINICAL DATA: Left elbow pain.  Fall.

EXAM:
LEFT ELBOW - COMPLETE 3+ VIEW

[elbow ap]
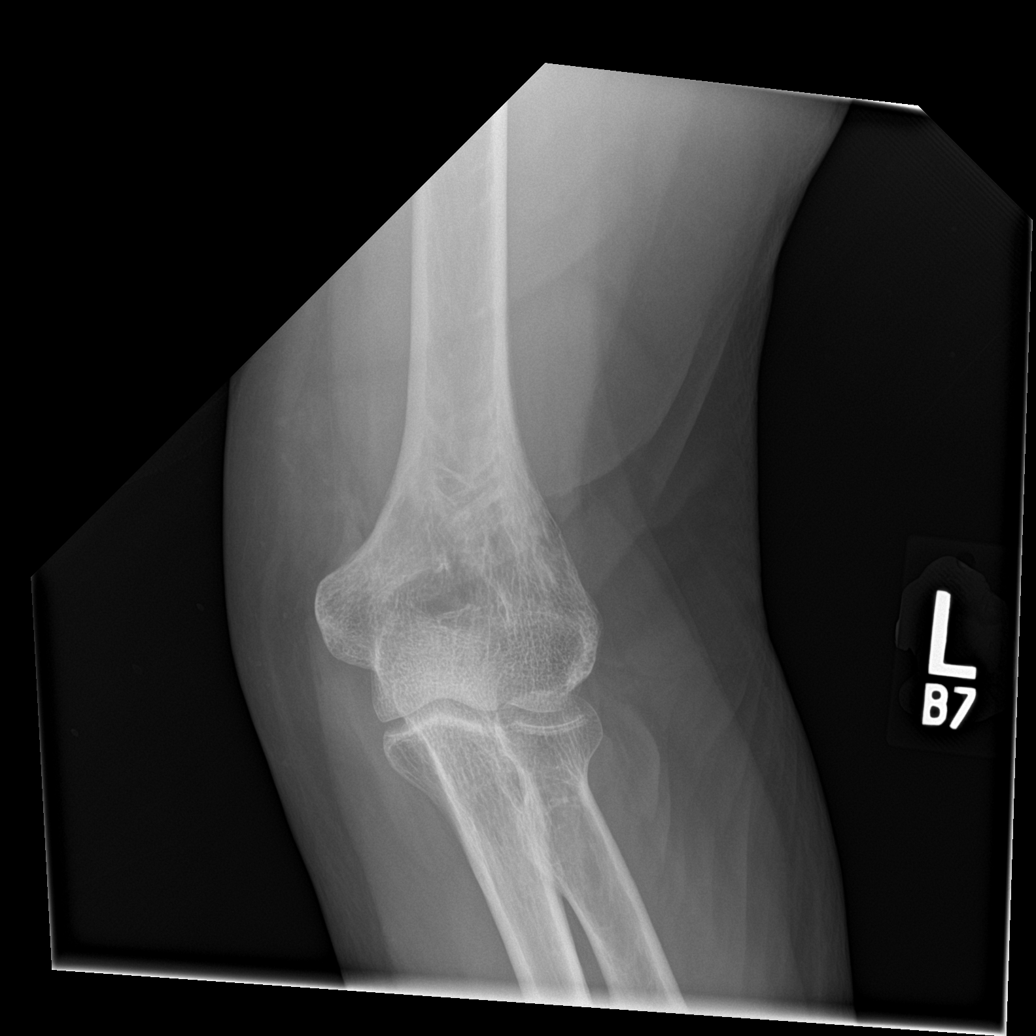

[elbow obl (1 of 2)]
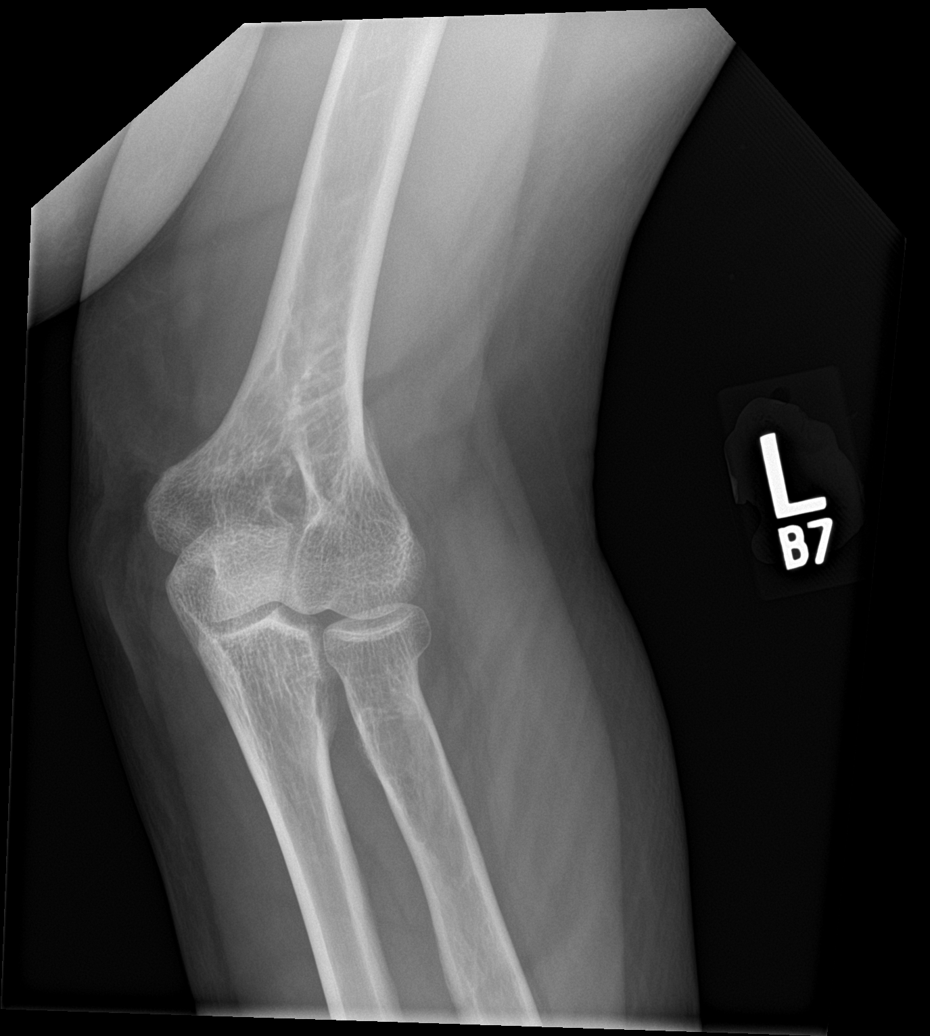

[elbow lat]
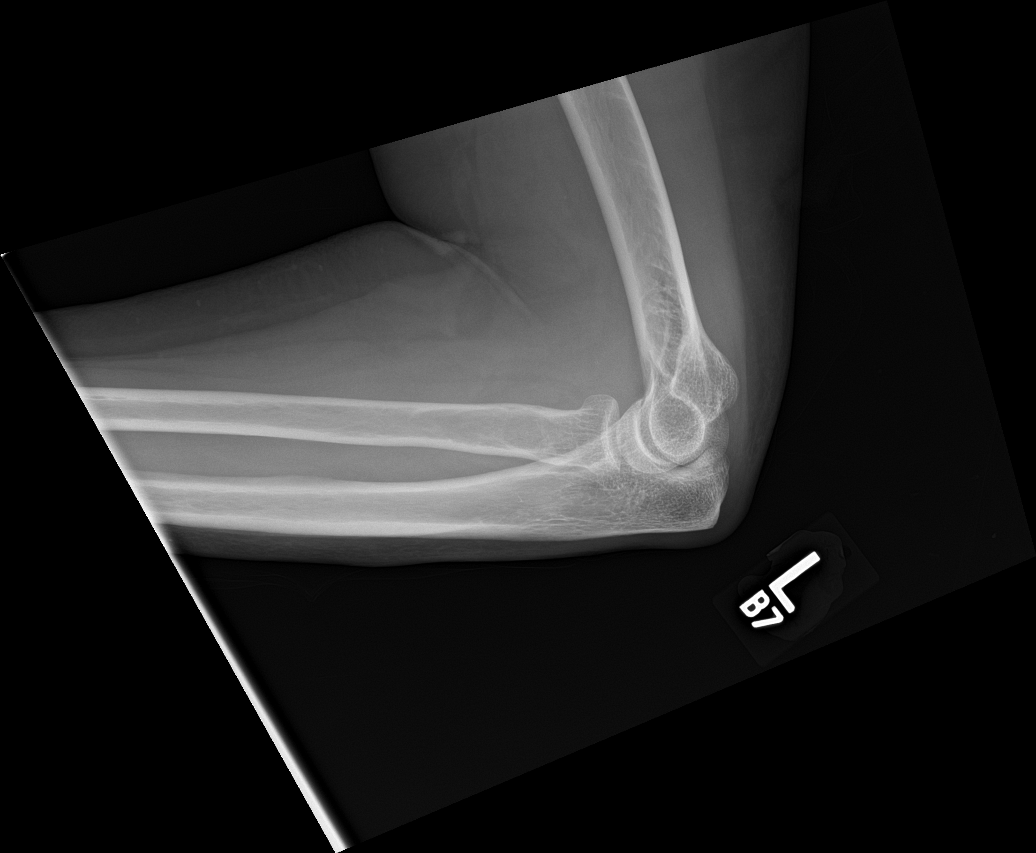

[elbow obl (2 of 2)]
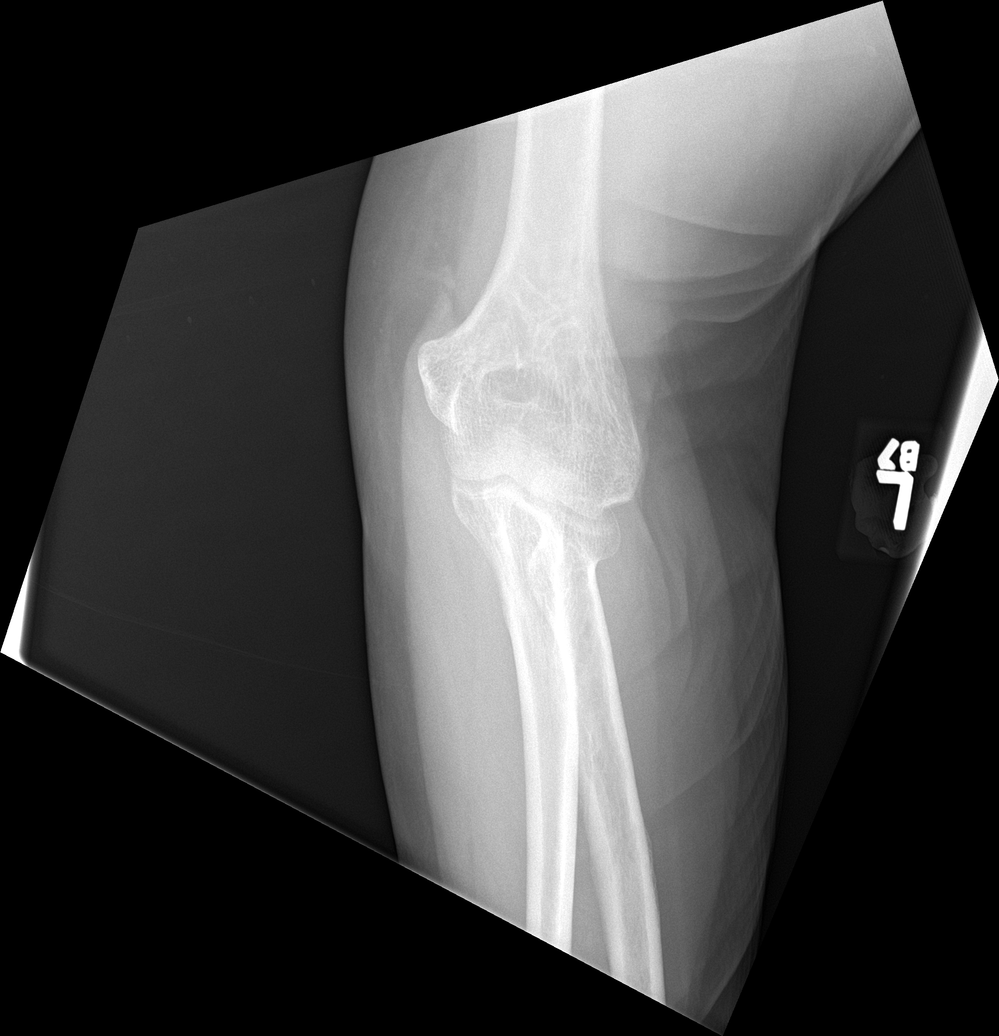

[4 of 4 positions shown; findings below may reference images not displayed]

FINDINGS: There is no evidence of fracture, dislocation, or joint effusion.
There is no evidence of arthropathy or other focal bone abnormality.
Soft tissues are unremarkable.
IMPRESSION: Negative.

## 2018-09-09 IMAGING — CT CT ABD-PELV W/ CM
2 of 5 series · 17 of 46 positions shown, 19 images · IV contrast (APPLIED)
Comparison: None.

CLINICAL DATA: Left-sided abdominal pain after fall today.

EXAM:
CT ABDOMEN AND PELVIS WITH CONTRAST
TECHNIQUE: Multidetector CT imaging of the abdomen and pelvis was performed
using the standard protocol following bolus administration of
intravenous contrast.
CONTRAST:  100mL OMNIPAQUE IOHEXOL 300 MG/ML  SOLN

[Series 2: routine abd/pel with · axial · 0.94mm/px · z∈[-828,-414]mm · 14 of 95 slices shown, 16 images]
[im 6/95  soft-tissue]
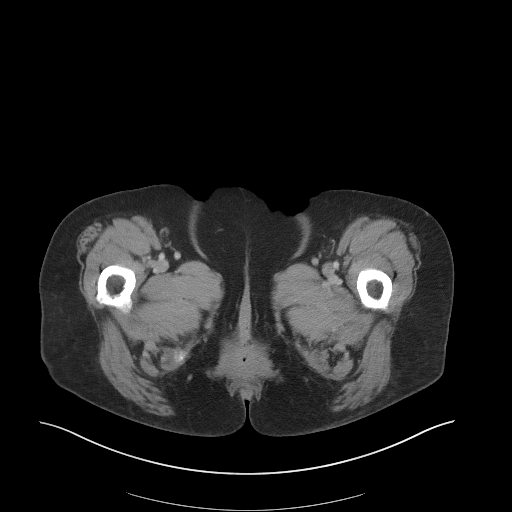
[im 6/95  bone]
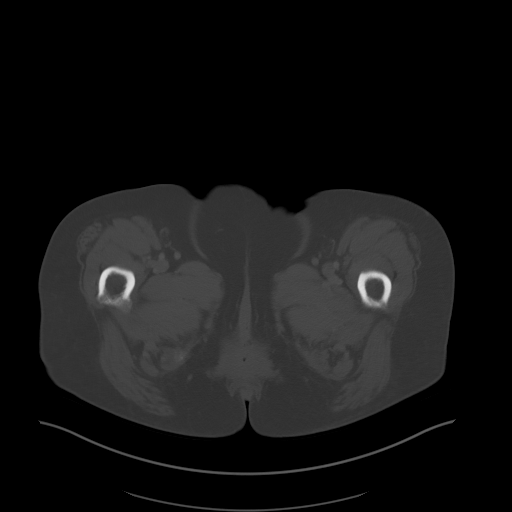
[im 11/95  soft-tissue]
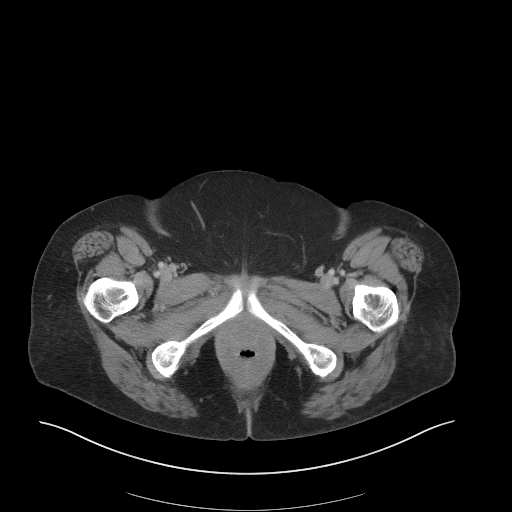
[im 21/95  soft-tissue]
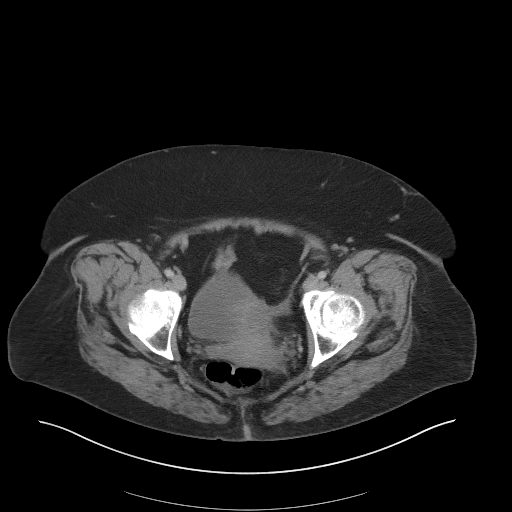
[im 27/95  soft-tissue]
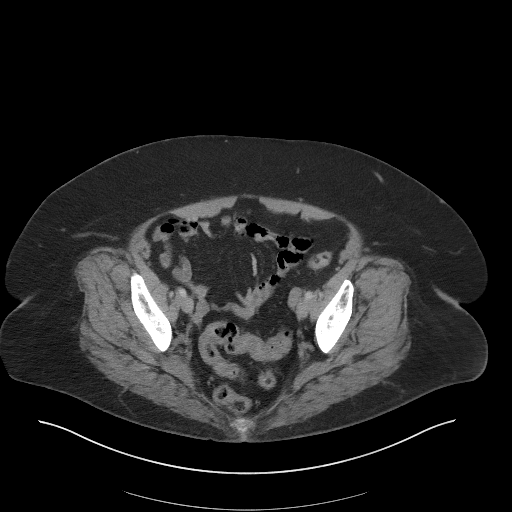
[im 32/95  soft-tissue]
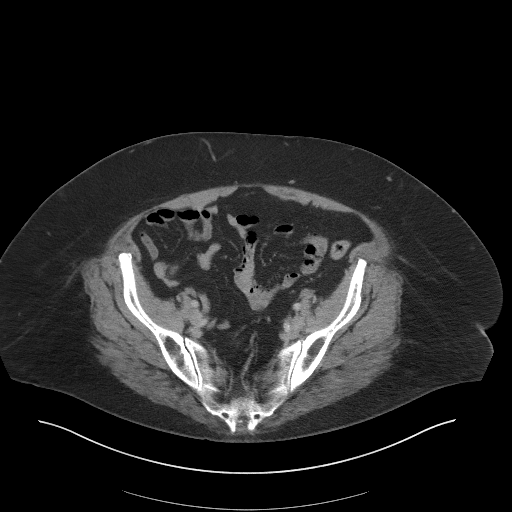
[im 37/95  soft-tissue]
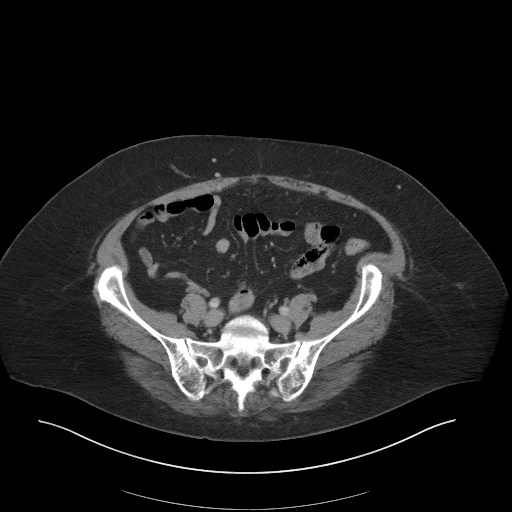
[im 42/95  soft-tissue]
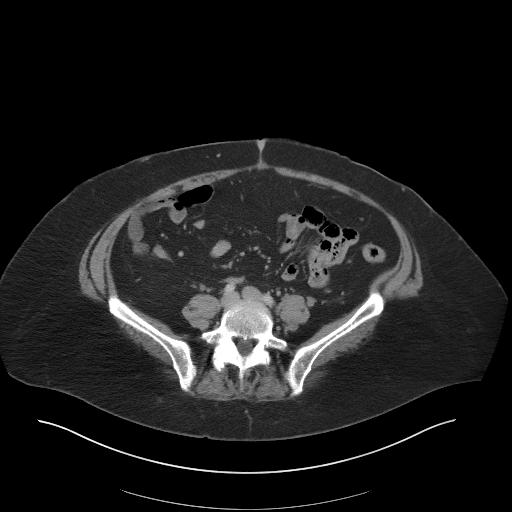
[im 53/95  soft-tissue]
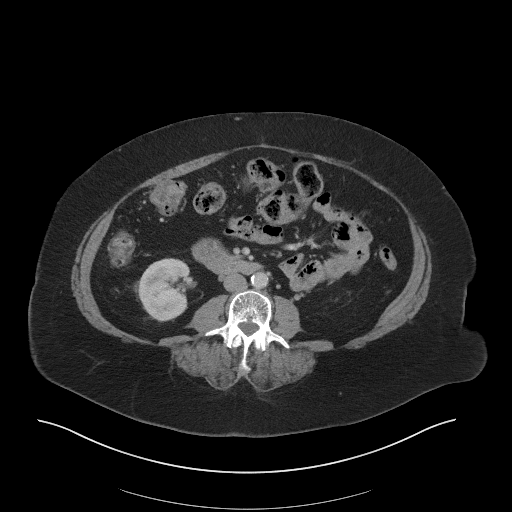
[im 58/95  soft-tissue]
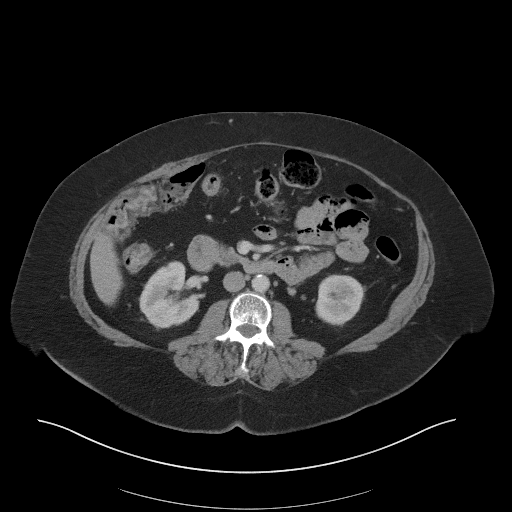
[im 58/95  bone]
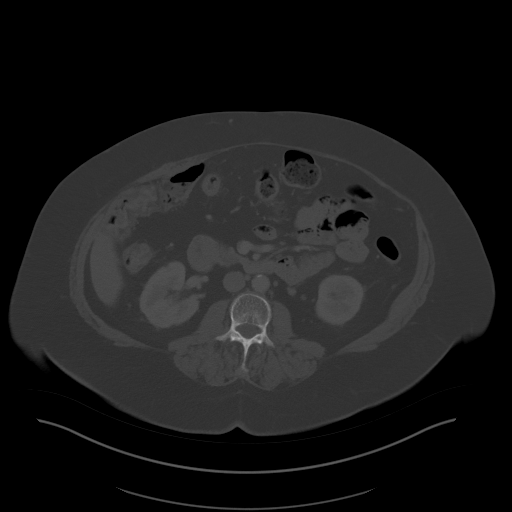
[im 63/95  soft-tissue]
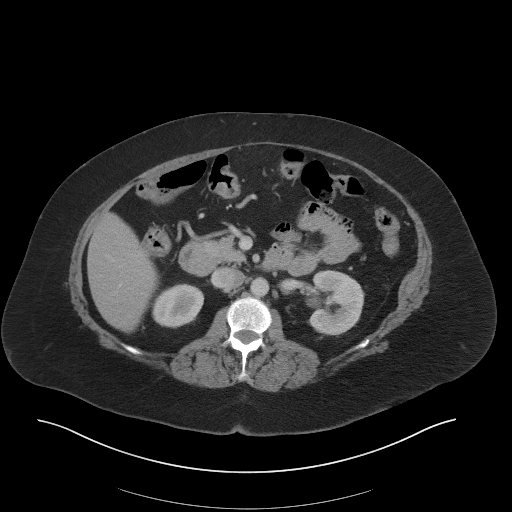
[im 68/95  soft-tissue]
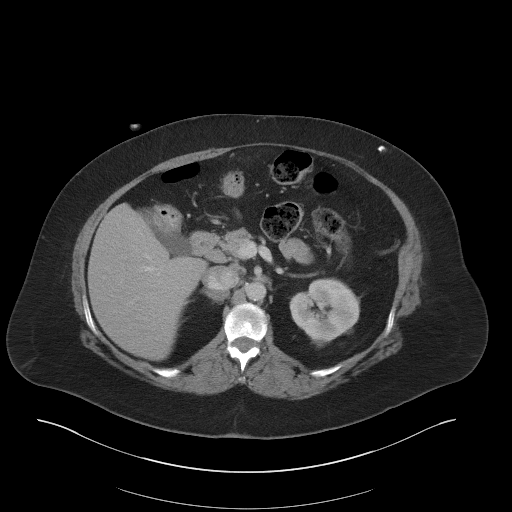
[im 74/95  soft-tissue]
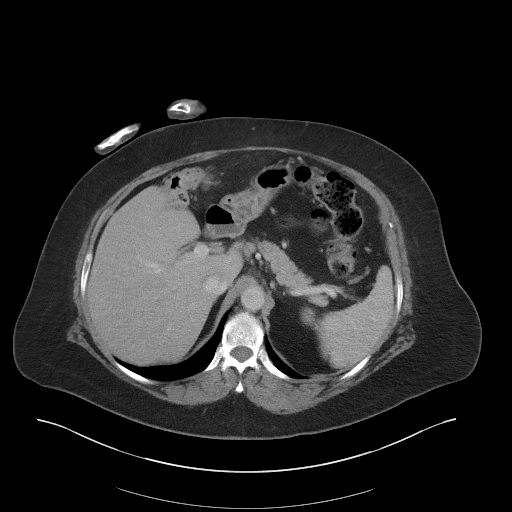
[im 84/95  soft-tissue]
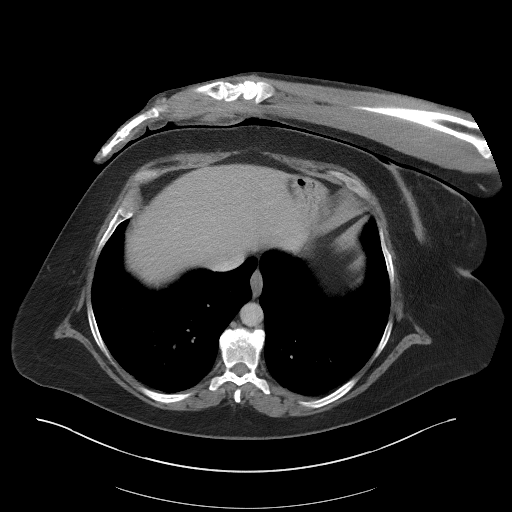
[im 89/95  soft-tissue]
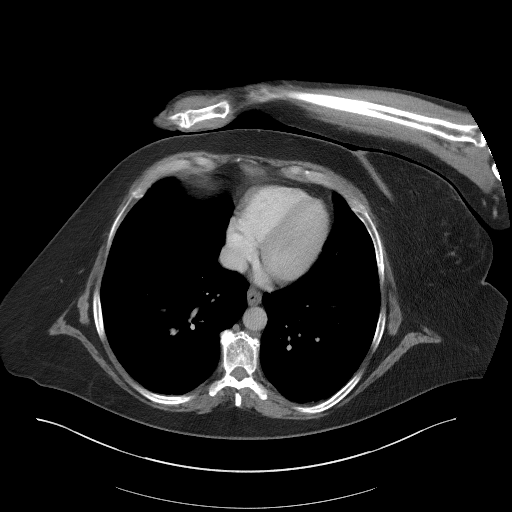

[Series 5: coronal st · coronal · 0.84mm/px · 3 of 97 slices shown]
[im 33/97  soft-tissue]
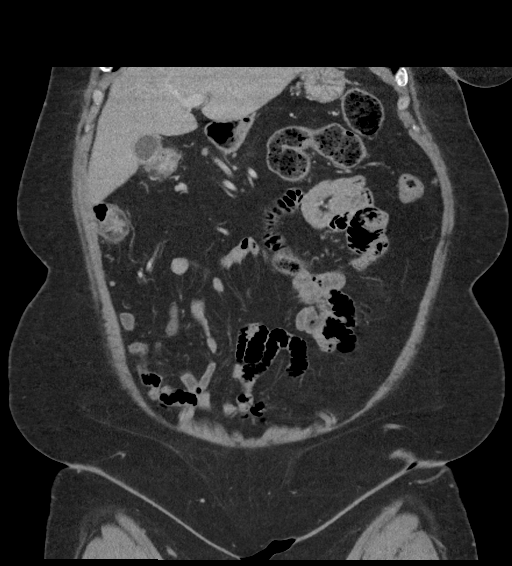
[im 43/97  soft-tissue]
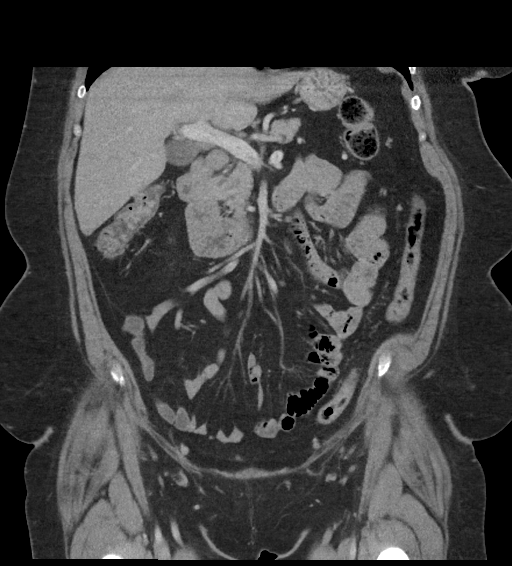
[im 54/97  soft-tissue]
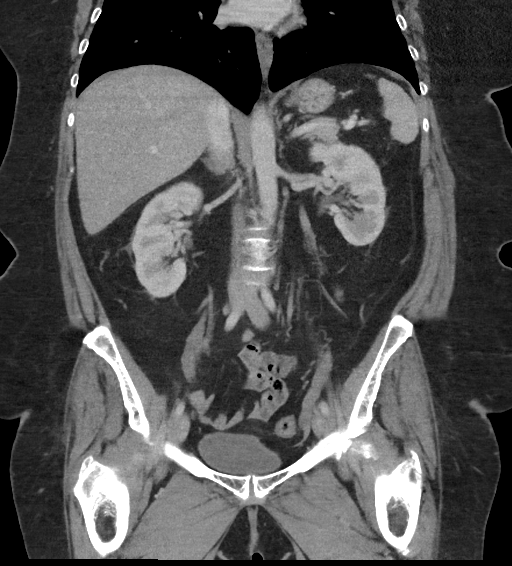

[17 of 46 positions shown; findings below may reference images not displayed]

FINDINGS: Lower chest: No acute abnormality.

Hepatobiliary: No focal liver abnormality is seen. No gallstones,
gallbladder wall thickening, or biliary dilatation.

Pancreas: Unremarkable. No pancreatic ductal dilatation or
surrounding inflammatory changes.

Spleen: Normal in size without focal abnormality.

Adrenals/Urinary Tract: Right adrenal gland appears normal. Left
adrenal gland is not visualized. Small left renal cysts are noted.
No hydronephrosis or renal obstruction is noted. Urinary bladder is
unremarkable. No renal or ureteral calculi are noted.

Stomach/Bowel: Stomach is within normal limits. Appendix appears
normal. No evidence of bowel wall thickening, distention, or
inflammatory changes.

Vascular/Lymphatic: Aortic atherosclerosis. No enlarged abdominal or
pelvic lymph nodes.

Reproductive: Uterus and bilateral adnexa are unremarkable.

Other: No abdominal wall hernia or abnormality. No abdominopelvic
ascites.

Musculoskeletal: No acute or significant osseous findings.
IMPRESSION: No acute abnormality seen in the abdomen or pelvis.

Aortic Atherosclerosis ([83]-[83]).

## 2018-09-09 IMAGING — CT CT CERVICAL SPINE W/O CM
3 of 7 series · 12 of 33 positions shown, 13 images · non-contrast
Comparison: Head CT and cervical spine CT [DATE]

CLINICAL DATA: Pain following fall

EXAM:
CT HEAD WITHOUT CONTRAST
CT CERVICAL SPINE WITHOUT CONTRAST
TECHNIQUE: Multidetector CT imaging of the head and cervical spine was
performed following the standard protocol without intravenous
contrast. Multiplanar CT image reconstructions of the cervical spine
were also generated.

[Series 6: coronal soft tissue · coronal · 0.30mm/px · 3 of 62 slices shown]
[im 16/62  bone]
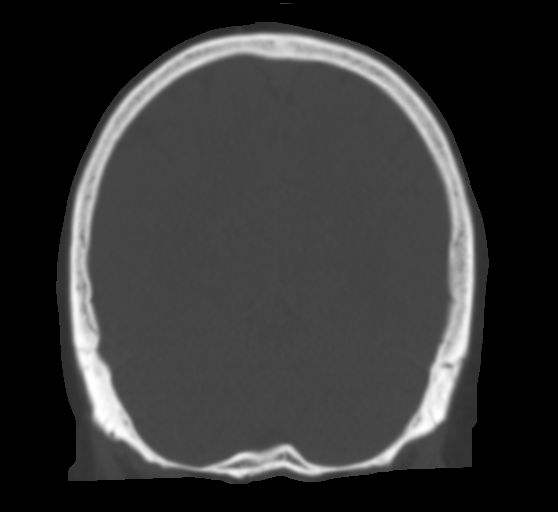
[im 31/62  bone]
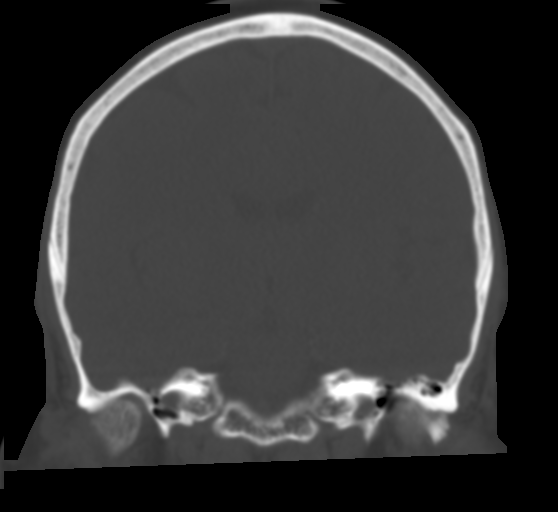
[im 46/62  bone]
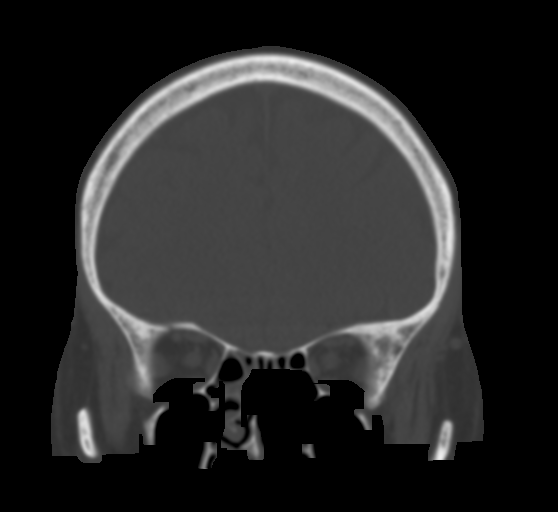

[Series 10: sagittal bone · sagittal · 0.27mm/px · 5 of 54 slices shown]
[im 9/54  bone]
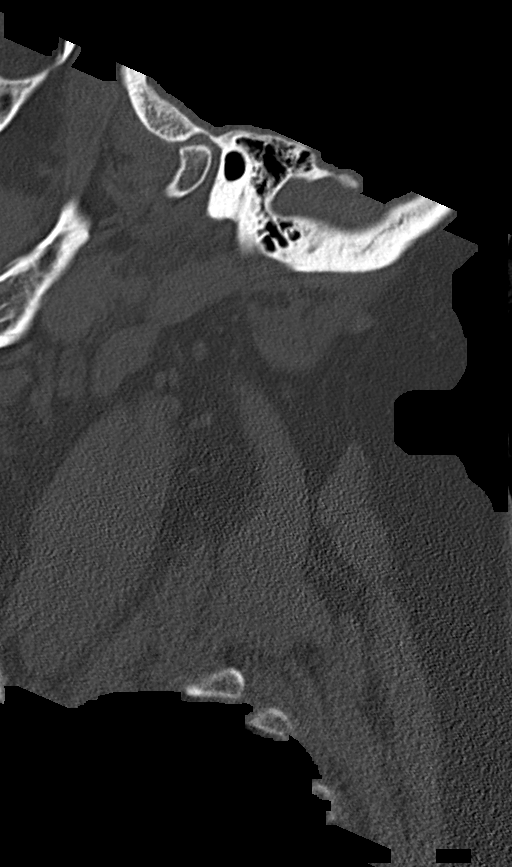
[im 18/54  bone]
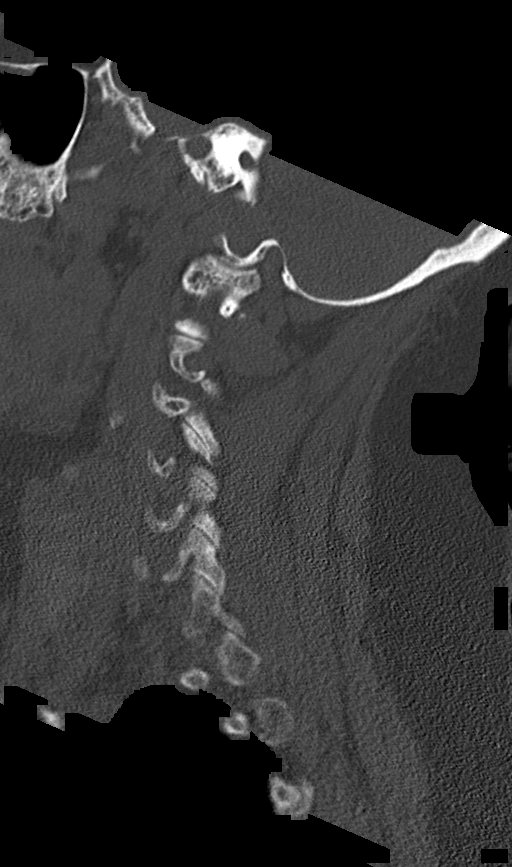
[im 27/54  bone]
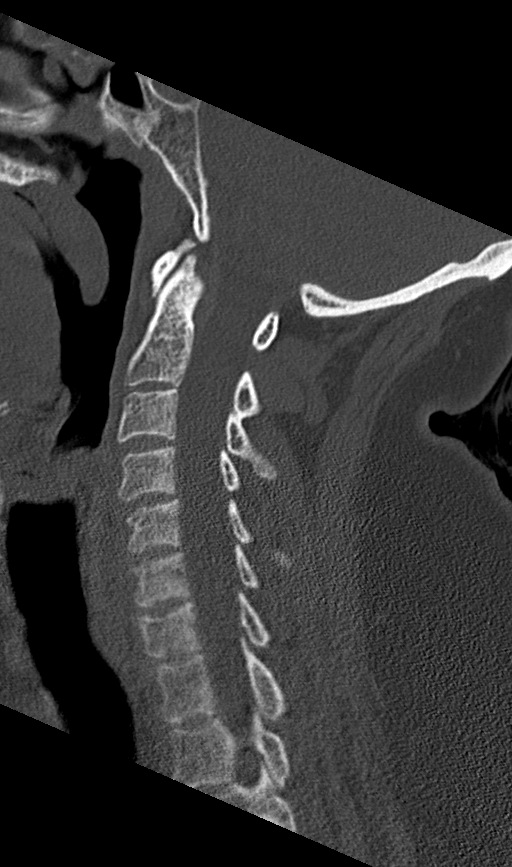
[im 36/54  bone]
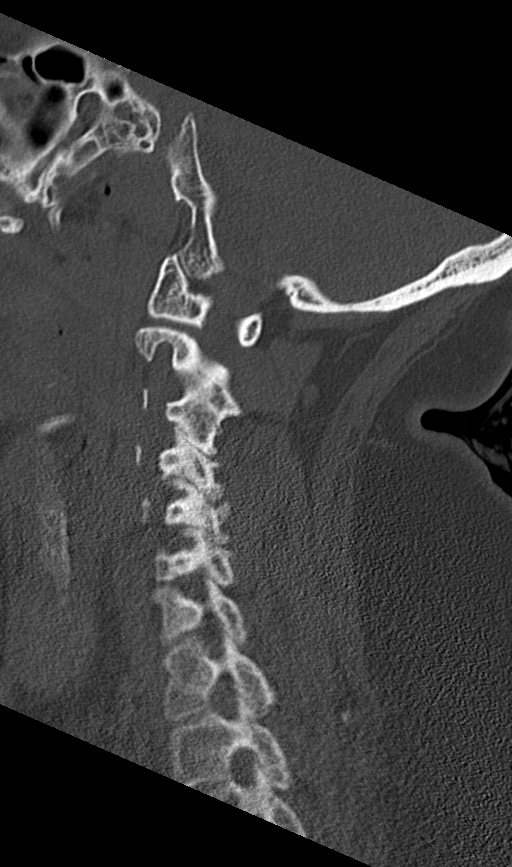
[im 45/54  bone]
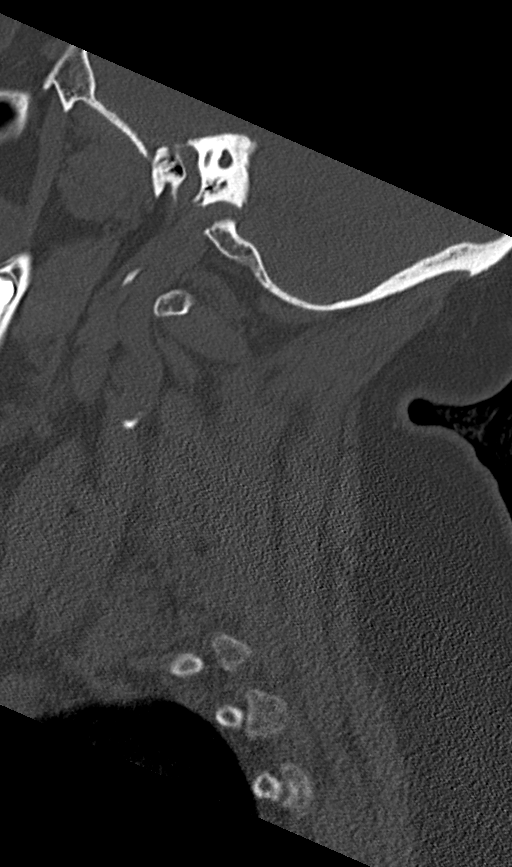

[Series 12: orthogonal bone · axial · 0.37mm/px · z∈[-275,-164]mm · 4 of 95 slices shown, 5 images]
[im 19/95  soft-tissue]
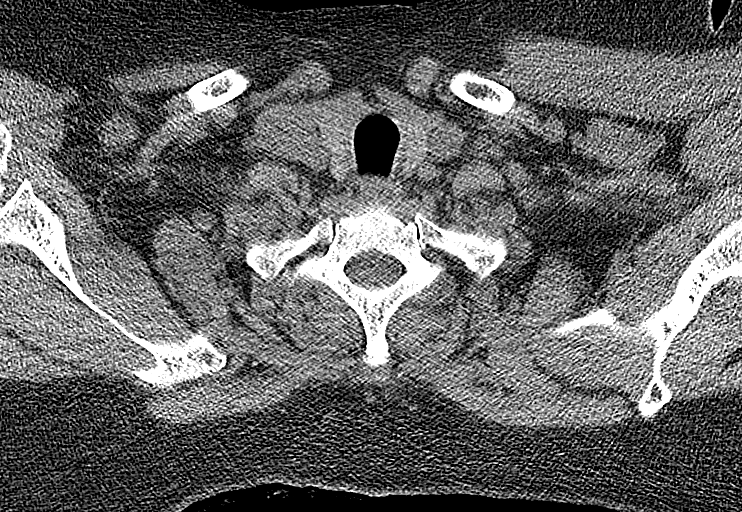
[im 19/95  bone]
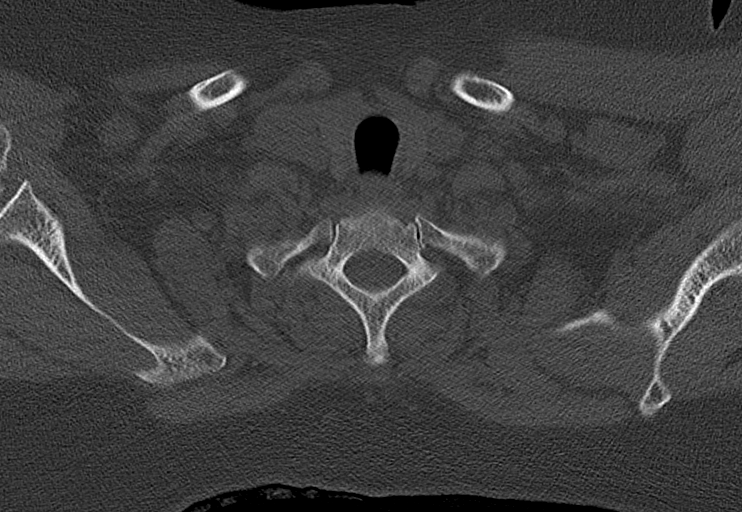
[im 38/95  bone]
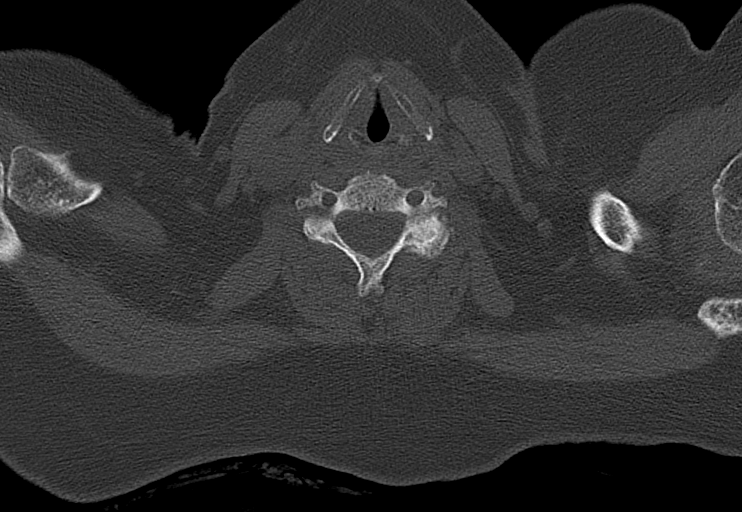
[im 57/95  bone]
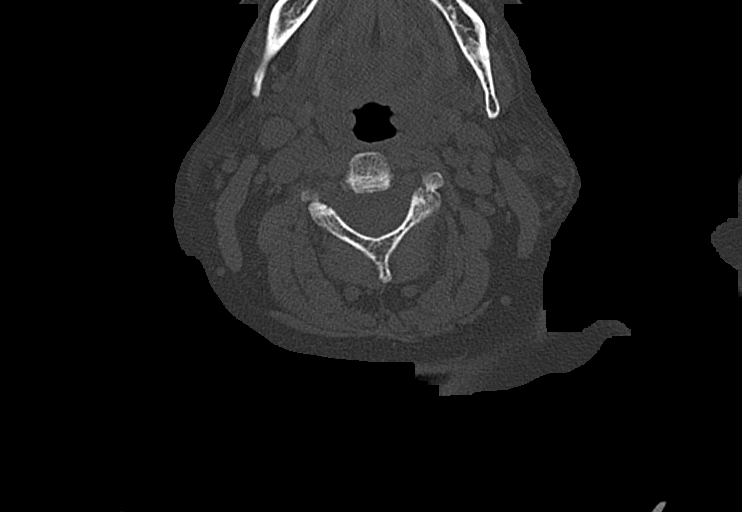
[im 76/95  bone]
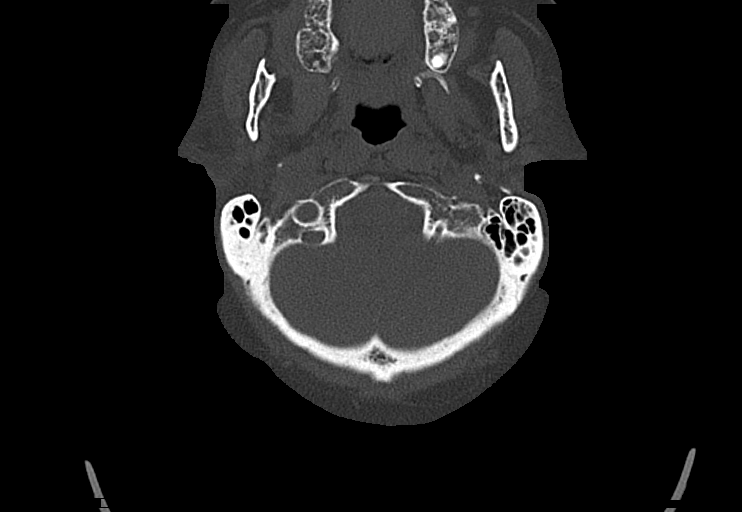

[12 of 33 positions shown; findings below may reference images not displayed]

FINDINGS: CT HEAD FINDINGS

Brain: The ventricles are normal in size and configuration. There is
no intracranial mass, hemorrhage, extra-axial fluid collection, or
midline shift. Brain parenchyma appears normal. No evident acute
infarct.

Vascular: No hyperdense lesion. There is slight calcification in
each cavernous carotid artery.

Skull: Bony calvarium appears intact.

Sinuses/Orbits: There is opacification of multiple ethmoid air cells
bilaterally. There is opacification in portions of each frontal
sinus, slightly more on the left than on the right. Orbits appear
symmetric bilaterally. Patient has had previous cataract removals
bilaterally.

Other: There is opacification in several inferior mastoid air cells
on the left, stable. Mastoids elsewhere clear.

CT CERVICAL SPINE FINDINGS

Alignment: There is no demonstrable spondylolisthesis.

Skull base and vertebrae: Skull base and craniocervical junction
regions appear normal. There is no evident acute fracture. There is
mild superior endplate concavity at C7, stable. No blastic or lytic
bone lesions are evident.

Soft tissues and spinal canal: Prevertebral soft tissues and
predental space regions are normal. There are no paraspinous
lesions. There is no evident cord or canal hematoma.

Disc levels: There is slight disc space narrowing at C5-6 and C7-T1.
There is facet hypertrophy at multiple levels, most notably at C4-5
and C5-6 on the left.. There is no evident disc extrusion or
stenosis.

Upper chest: Visualized upper lung regions are clear.

Other: There is calcification in each carotid artery.
IMPRESSION: CT head: Brain parenchyma appears normal.  No mass or hemorrhage.

Mild arterial vascular calcification noted. There is multifocal
paranasal sinus disease. There is chronic inferior mastoid disease
on the left.

CT cervical spine:

1. No acute fracture. Superior endplate concavity at C7 is chronic.
No spondylolisthesis.

2.  Osteoarthritic changes several levels noted.

3.  Foci of carotid artery calcification noted bilaterally.

## 2018-09-09 MED ORDER — IOHEXOL 300 MG/ML  SOLN
100.0000 mL | Freq: Once | INTRAMUSCULAR | Status: AC | PRN
  Administered 2018-09-09: 100 mL via INTRAVENOUS
  Filled 2018-09-09: qty 100

## 2018-09-09 MED ORDER — MORPHINE SULFATE (PF) 4 MG/ML IV SOLN
4.0000 mg | Freq: Once | INTRAVENOUS | Status: AC
  Administered 2018-09-09: 4 mg via INTRAVENOUS
  Filled 2018-09-09: qty 1

## 2018-09-09 MED ORDER — ONDANSETRON HCL 4 MG/2ML IJ SOLN
4.0000 mg | Freq: Once | INTRAMUSCULAR | Status: AC
  Administered 2018-09-09: 4 mg via INTRAVENOUS
  Filled 2018-09-09: qty 2

## 2018-09-09 NOTE — ED Notes (Signed)
Saline lock removed   Tolerated well  States she just needs to go home   She has to eat a meal by 2 pm to be able to take her meds  Offered her a sandwich tray but she refused   Encouraged her to remain in room until her son arrives  States she can't wait
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
## 2018-10-14 ENCOUNTER — Other Ambulatory Visit: Payer: Self-pay | Admitting: Physician Assistant

## 2018-10-14 DIAGNOSIS — Z1231 Encounter for screening mammogram for malignant neoplasm of breast: Secondary | ICD-10-CM

## 2019-01-04 ENCOUNTER — Ambulatory Visit
Admission: RE | Admit: 2019-01-04 | Discharge: 2019-01-04 | Disposition: A | Discharge status: Home / Self Care | Payer: Medicaid Other | Source: Ambulatory Visit | Attending: Physician Assistant | Admitting: Physician Assistant

## 2019-01-04 DIAGNOSIS — Z1231 Encounter for screening mammogram for malignant neoplasm of breast: Secondary | ICD-10-CM | POA: Insufficient documentation

## 2019-01-04 IMAGING — MG DIGITAL SCREENING BILATERAL MAMMOGRAM WITH TOMO AND CAD
8 series · 8 of 24 positions shown · non-contrast
Comparison: Previous exam(s).

CLINICAL DATA: Screening.

EXAM:
DIGITAL SCREENING BILATERAL MAMMOGRAM WITH TOMO AND CAD

[L CC synth-2D]
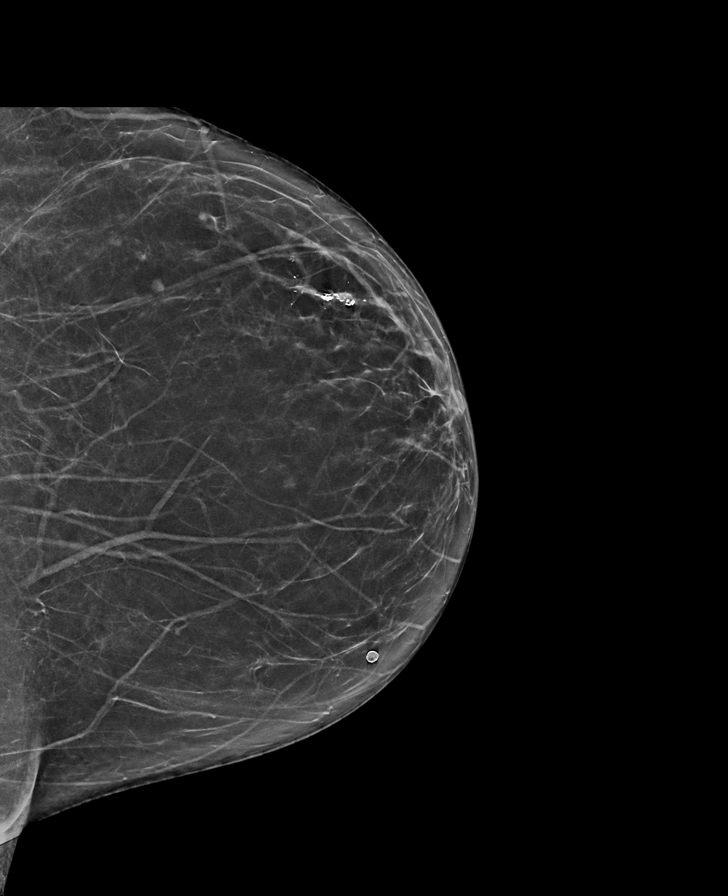

[L MLO synth-2D]
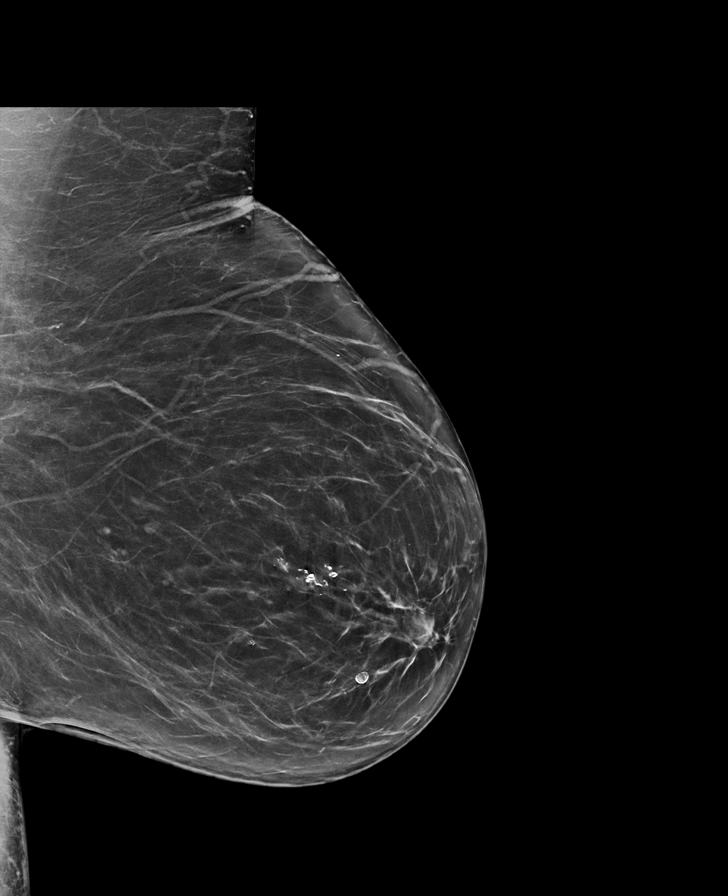

[R MLO synth-2D]
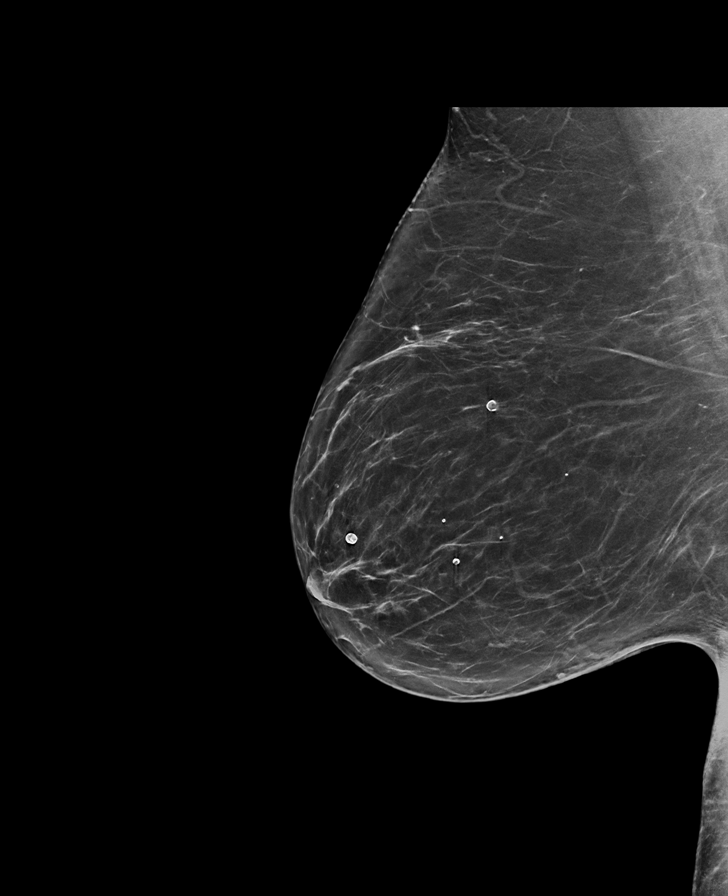

[R CC synth-2D]
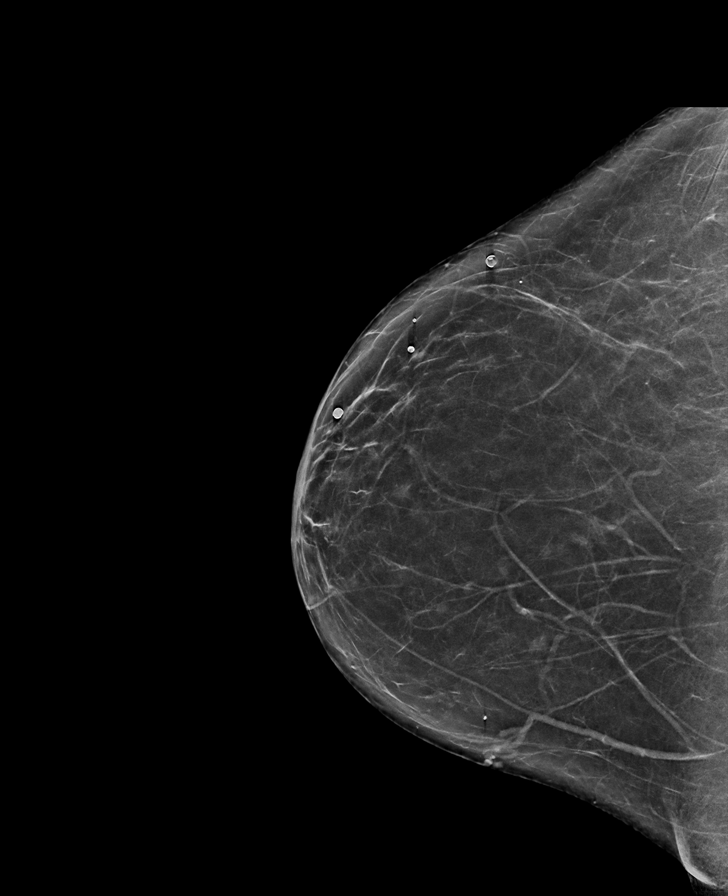

[L CC tomo · tomo slice 34/67.0]
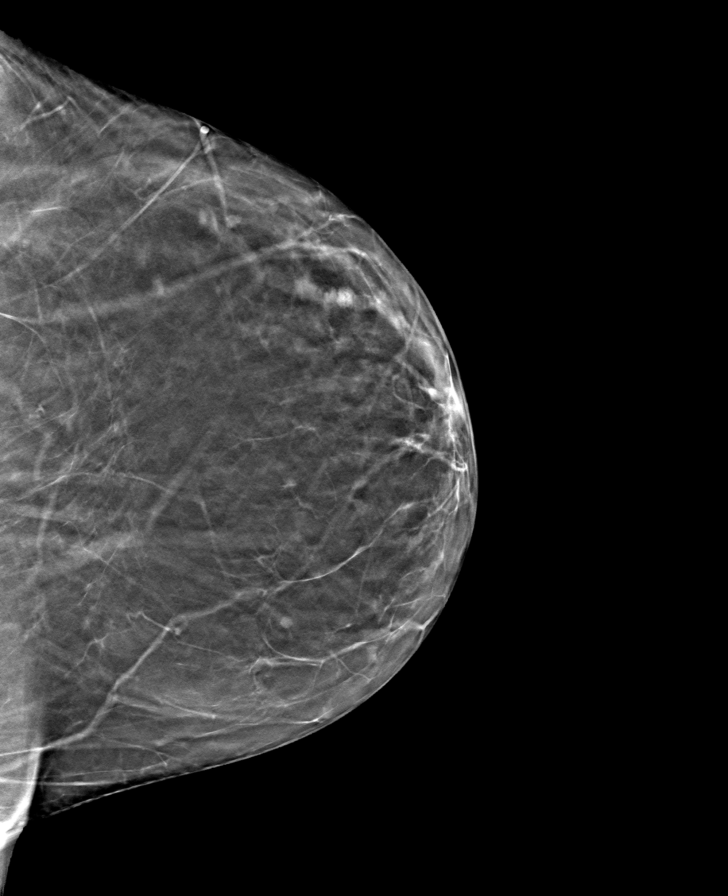

[L MLO tomo · tomo slice 40/79.0]
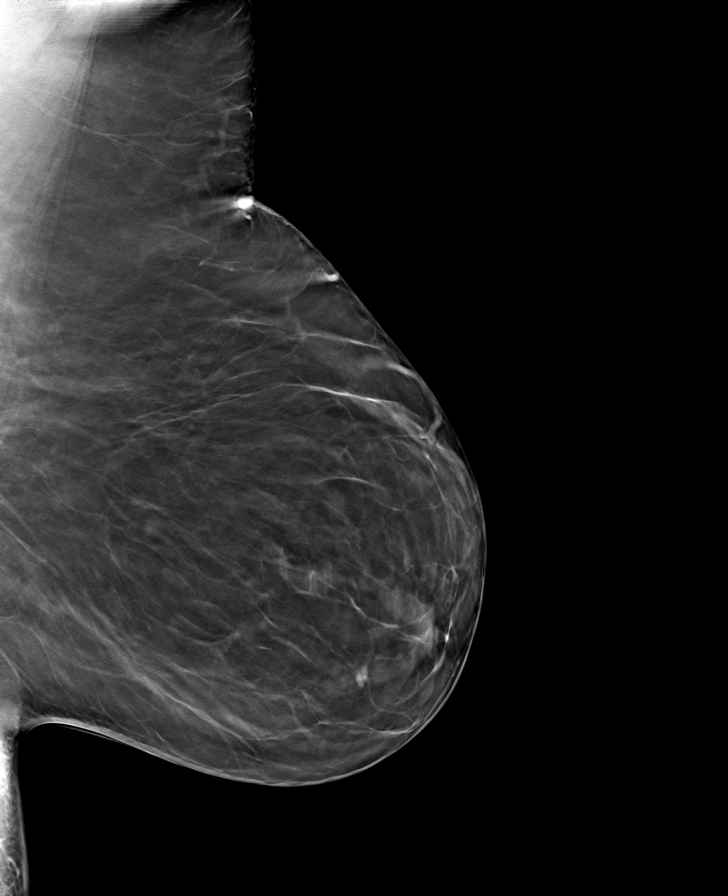

[R MLO tomo · tomo slice 39/78.0]
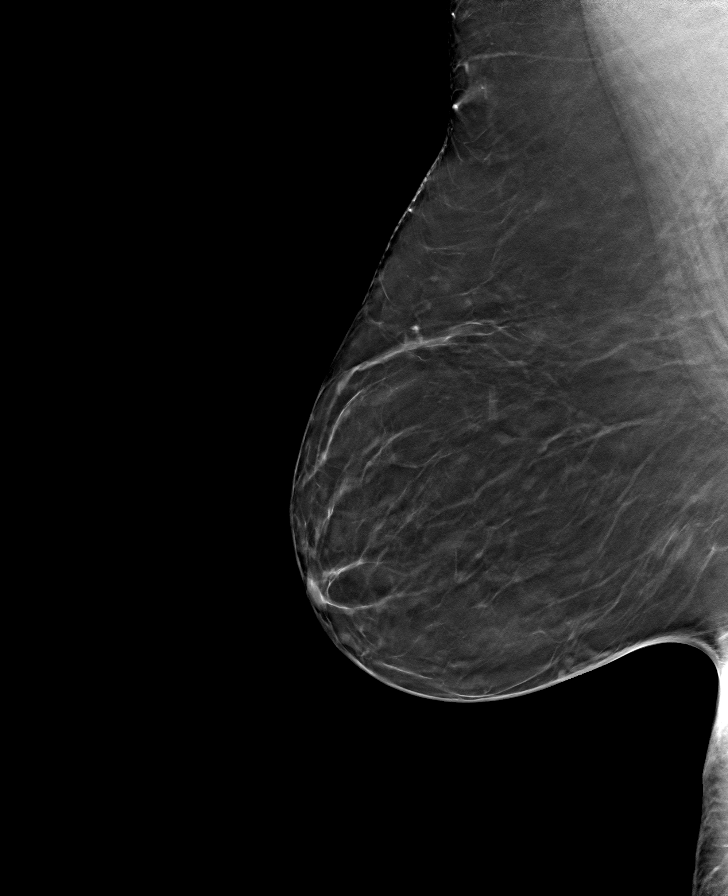

[R CC tomo · tomo slice 35/69.0]
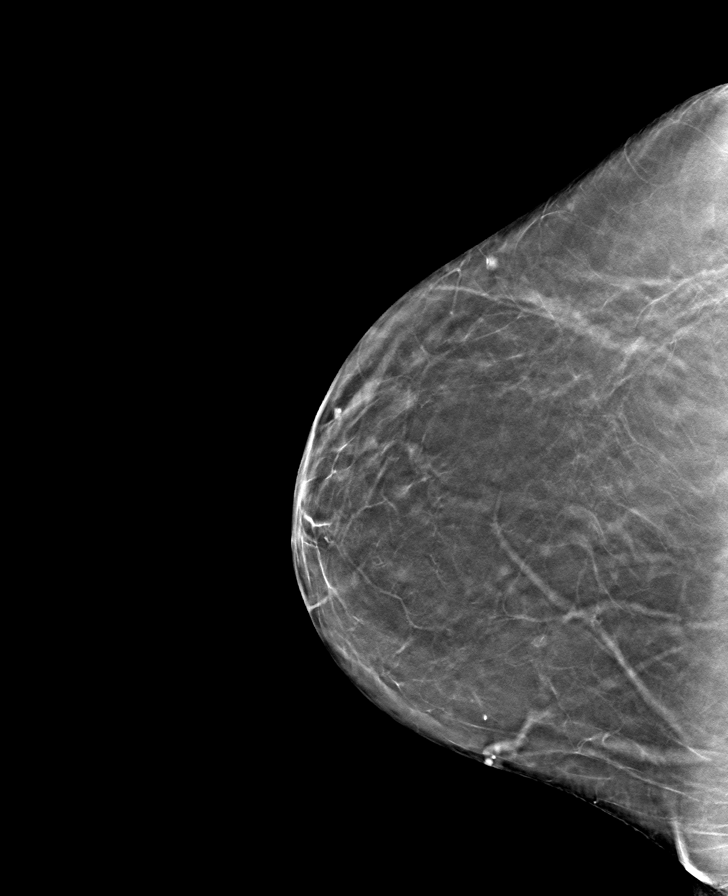

[8 of 24 positions shown; findings below may reference images not displayed]

ACR Breast Density Category b: There are scattered areas of
fibroglandular density.
FINDINGS: There are no findings suspicious for malignancy. Images were
processed with CAD.
IMPRESSION: No mammographic evidence of malignancy. A result letter of this
screening mammogram will be mailed directly to the patient.

RECOMMENDATION:
Screening mammogram in one year. (Code:[TQ])

BI-RADS CATEGORY  1: Negative.

## 2019-05-21 ENCOUNTER — Emergency Department: Payer: Medicare Other

## 2019-05-21 ENCOUNTER — Other Ambulatory Visit: Payer: Self-pay

## 2019-05-21 ENCOUNTER — Emergency Department
Admission: EM | Admit: 2019-05-21 | Discharge: 2019-05-21 | Disposition: A | Discharge status: Home / Self Care | Payer: Medicare Other | Attending: Emergency Medicine | Admitting: Emergency Medicine

## 2019-05-21 DIAGNOSIS — Y92410 Unspecified street and highway as the place of occurrence of the external cause: Secondary | ICD-10-CM | POA: Diagnosis not present

## 2019-05-21 DIAGNOSIS — J449 Chronic obstructive pulmonary disease, unspecified: Secondary | ICD-10-CM | POA: Insufficient documentation

## 2019-05-21 DIAGNOSIS — Y999 Unspecified external cause status: Secondary | ICD-10-CM | POA: Diagnosis not present

## 2019-05-21 DIAGNOSIS — M25531 Pain in right wrist: Secondary | ICD-10-CM | POA: Diagnosis not present

## 2019-05-21 DIAGNOSIS — Z79899 Other long term (current) drug therapy: Secondary | ICD-10-CM | POA: Insufficient documentation

## 2019-05-21 DIAGNOSIS — F17210 Nicotine dependence, cigarettes, uncomplicated: Secondary | ICD-10-CM | POA: Insufficient documentation

## 2019-05-21 DIAGNOSIS — Y9389 Activity, other specified: Secondary | ICD-10-CM | POA: Insufficient documentation

## 2019-05-21 DIAGNOSIS — M25511 Pain in right shoulder: Secondary | ICD-10-CM
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
## 2019-05-21 MED ORDER — HYDROCODONE-ACETAMINOPHEN 5-325 MG PO TABS
1.0000 | ORAL_TABLET | Freq: Once | ORAL | Status: AC
  Administered 2019-05-21: 1 via ORAL
  Filled 2019-05-21: qty 1

## 2019-05-21 MED ORDER — HYDROCODONE-ACETAMINOPHEN 5-325 MG PO TABS: 1.0000 | ORAL_TABLET | ORAL | 0 refills | Status: DC | PRN
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
## 2019-05-24 ENCOUNTER — Other Ambulatory Visit: Payer: Self-pay

## 2019-05-24 DIAGNOSIS — Z20828 Contact with and (suspected) exposure to other viral communicable diseases: Secondary | ICD-10-CM | POA: Insufficient documentation

## 2019-05-24 DIAGNOSIS — N179 Acute kidney failure, unspecified: Secondary | ICD-10-CM | POA: Insufficient documentation

## 2019-05-24 DIAGNOSIS — J45909 Unspecified asthma, uncomplicated: Secondary | ICD-10-CM | POA: Diagnosis not present

## 2019-05-24 DIAGNOSIS — E860 Dehydration: Secondary | ICD-10-CM | POA: Insufficient documentation

## 2019-05-24 DIAGNOSIS — Z79899 Other long term (current) drug therapy: Secondary | ICD-10-CM | POA: Insufficient documentation

## 2019-05-24 DIAGNOSIS — J449 Chronic obstructive pulmonary disease, unspecified: Secondary | ICD-10-CM | POA: Insufficient documentation

## 2019-05-24 DIAGNOSIS — R531 Weakness: Secondary | ICD-10-CM | POA: Diagnosis present

## 2019-05-24 DIAGNOSIS — F17210 Nicotine dependence, cigarettes, uncomplicated: Secondary | ICD-10-CM | POA: Diagnosis not present

## 2019-05-25 ENCOUNTER — Emergency Department
Admission: EM | Admit: 2019-05-25 | Discharge: 2019-05-25 | Disposition: A | Discharge status: Home / Self Care | Payer: Medicare Other | Attending: Emergency Medicine | Admitting: Emergency Medicine

## 2019-05-25 ENCOUNTER — Emergency Department: Payer: Medicare Other

## 2019-05-25 ENCOUNTER — Other Ambulatory Visit: Payer: Self-pay

## 2019-05-25 DIAGNOSIS — Z20822 Contact with and (suspected) exposure to covid-19: Secondary | ICD-10-CM

## 2019-05-25 DIAGNOSIS — N179 Acute kidney failure, unspecified: Secondary | ICD-10-CM

## 2019-05-25 DIAGNOSIS — E860 Dehydration: Secondary | ICD-10-CM

## 2019-05-25 DIAGNOSIS — Z20828 Contact with and (suspected) exposure to other viral communicable diseases: Secondary | ICD-10-CM

## 2019-05-25 LAB — CBC
HCT: 39.7 % (ref 36.0–46.0)
Hemoglobin: 13 g/dL (ref 12.0–15.0)
MCH: 30.1 pg (ref 26.0–34.0)
MCHC: 32.7 g/dL (ref 30.0–36.0)
MCV: 91.9 fL (ref 80.0–100.0)
Platelets: 247 10*3/uL (ref 150–400)
RBC: 4.32 MIL/uL (ref 3.87–5.11)
RDW: 12.9 % (ref 11.5–15.5)
WBC: 9.1 10*3/uL (ref 4.0–10.5)
nRBC: 0 % (ref 0.0–0.2)

## 2019-05-25 LAB — BASIC METABOLIC PANEL
Anion gap: 9 (ref 5–15)
BUN: 17 mg/dL (ref 6–20)
CO2: 26 mmol/L (ref 22–32)
Calcium: 9.1 mg/dL (ref 8.9–10.3)
Chloride: 104 mmol/L (ref 98–111)
Creatinine, Ser: 1.55 mg/dL — ABNORMAL HIGH (ref 0.44–1.00)
GFR calc Af Amer: 43 mL/min — ABNORMAL LOW (ref >60)
GFR calc non Af Amer: 37 mL/min — ABNORMAL LOW (ref >60)
Glucose, Bld: 109 mg/dL — ABNORMAL HIGH (ref 70–99)
Potassium: 3.6 mmol/L (ref 3.5–5.1)
Sodium: 139 mmol/L (ref 135–145)

## 2019-05-25 IMAGING — DX PORTABLE CHEST - 1 VIEW
1 series · 1 of 1 positions shown · non-contrast
Comparison: [DATE]

CLINICAL DATA: [DATE]

EXAM:
PORTABLE CHEST 1 VIEW

[chest ap]
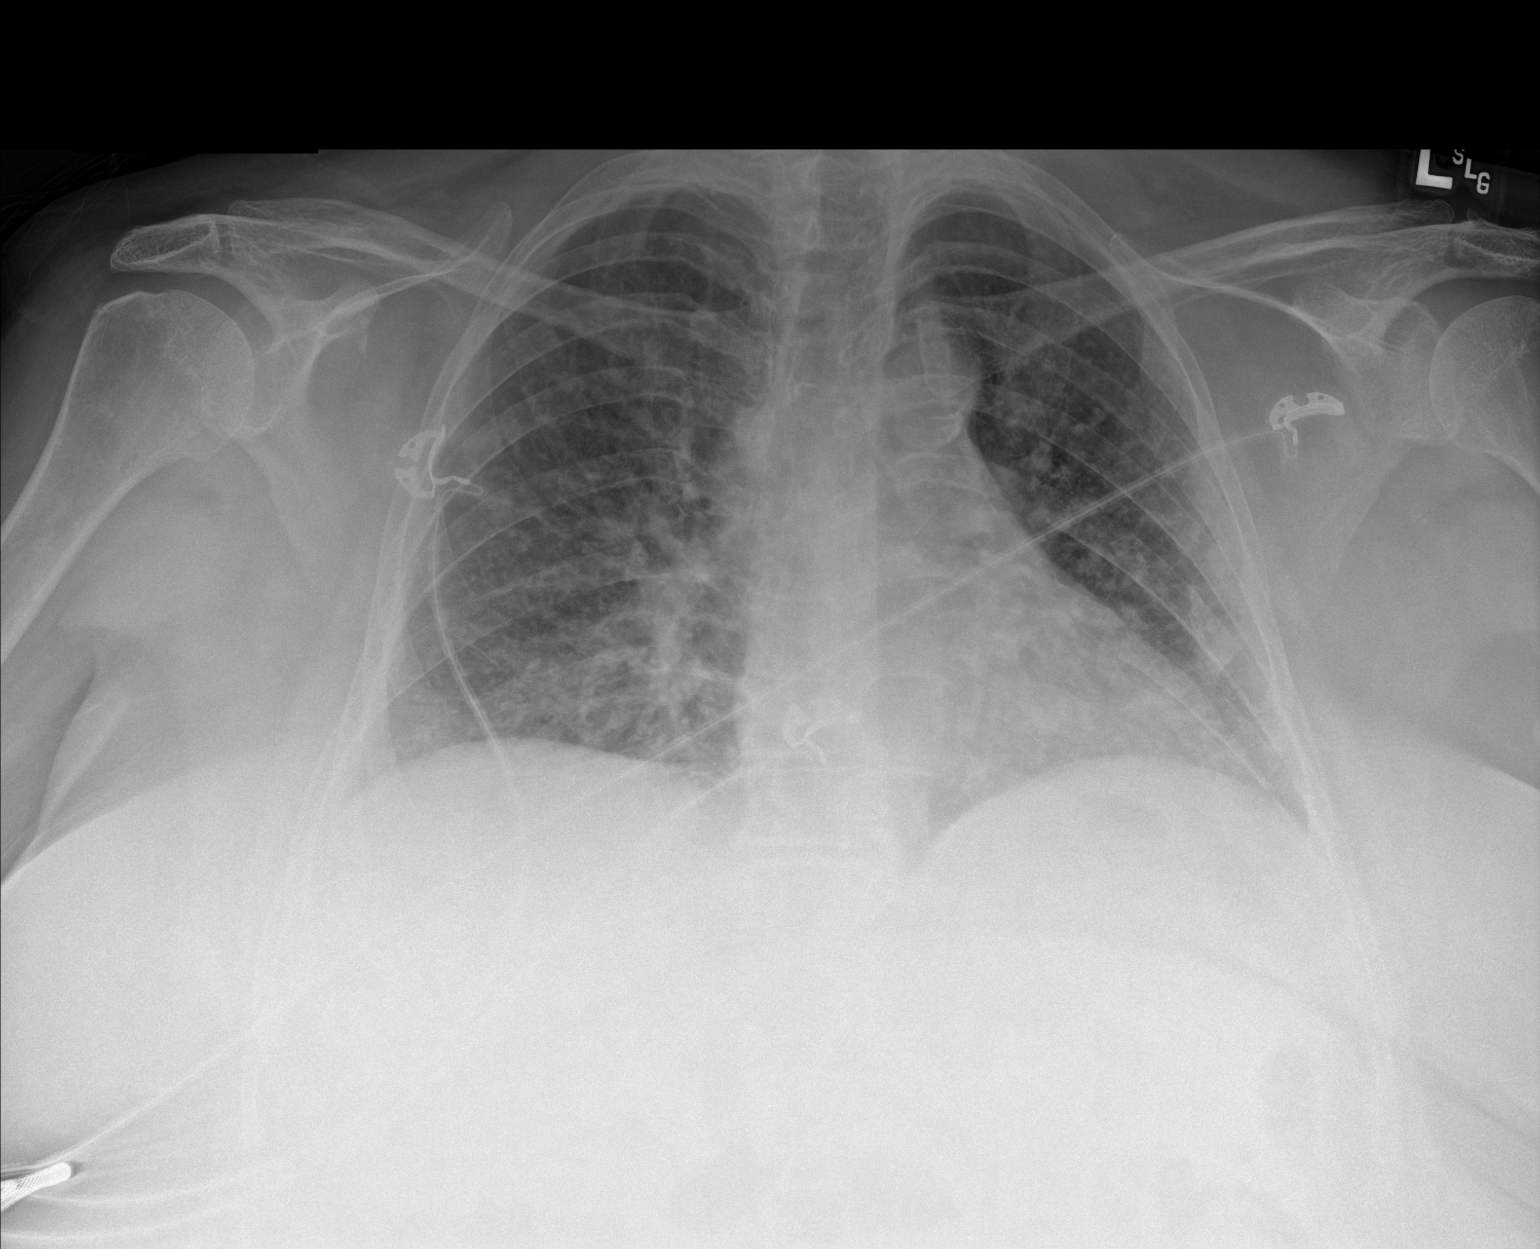

[1 of 1 positions shown; findings below may reference images not displayed]

FINDINGS: Low volume chest with generalized interstitial coarsening.
Borderline heart size accentuated by low volumes. No effusion or
pneumothorax.
IMPRESSION: Generalized interstitial opacity that is likely bronchitic.

## 2019-05-25 MED ORDER — AZITHROMYCIN 250 MG PO TABS: ORAL_TABLET | ORAL | 0 refills | Status: DC

## 2019-05-25 MED ORDER — SODIUM CHLORIDE 0.9 % IV BOLUS
1000.0000 mL | Freq: Once | INTRAVENOUS | Status: AC
  Administered 2019-05-25: 1000 mL via INTRAVENOUS
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
## 2019-05-25 NOTE — ED Notes (Signed)
Pt verbalized understanding of discharge instructions. NAD at this time. 
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
## 2019-05-25 NOTE — ED Provider Notes (Signed)
[facility] Emergency Department Provider Note  ____________________________________________   First MD Initiated Contact with Patient 05/25/19 0601     (approximate)  I have reviewed the triage vital signs and the nursing notes.   HISTORY  Chief Complaint Weakness  Level 5 caveat:  history/ROS limited by the patient being minimally cooperative and is very vague historian.  HPI Sally Hughes is a 58 y.o. female who presents tonight by private vehicle for evaluation of a variety of symptoms.  Of note her adult son checked in as well for similarly vague symptoms and was also 1 of my patients.  She is complaining of a loss of taste, increased fatigue, and some suggestive fever over the last couple of days.  Apparently the family had a close COVID-19 positive contact but the patient is minimally helpful in terms of providing any history.  She was able to ambulate from the waiting room, where she had been waiting multiple hours, to her exam room without any difficulty.  She then got in bed and went promptly to sleep.  She is not able to give me any further description of her symptoms except that she has had a little bit of a cough and she was around someone that may have had COVID.  She is in no acute distress.         Past Medical History:  Diagnosis Date  . Arthritis    whole body  . Asthma    inhaler in summer  . Back pain   . Bell's palsy    12 yrs ago, right eye weaker  . COPD (chronic obstructive pulmonary disease) (HCC)    wheezing  . Depression   . Dyspnea   . Headache    about everyday  . Hepatitis C   . Kidney tumor (benign), left   . Pneumonia    in past  . Snake bite    as a child    There are no active problems to display for this patient.   Past Surgical History:  Procedure Laterality Date  . CATARACT EXTRACTION W/PHACO Left 04/19/2018   Procedure: CATARACT EXTRACTION PHACO AND INTRAOCULAR LENS PLACEMENT ([place]) LEFT IVA  TOPICAL;  Surgeon: Leandrew Koyanagi, MD;  Location: [place];  Service: Ophthalmology;  Laterality: Left;  . CATARACT EXTRACTION W/PHACO Right 05/19/2018   Procedure: CATARACT EXTRACTION PHACO AND INTRAOCULAR LENS PLACEMENT ([place])  RIGHT;  Surgeon: Leandrew Koyanagi, MD;  Location: [place];  Service: Ophthalmology;  Laterality: Right;  . TUBAL LIGATION    . TUMOR REMOVAL     benign tumor removed left kidney    Prior to Admission medications   Medication Sig Start Date End Date Taking? Authorizing Provider  albuterol (PROVENTIL HFA;VENTOLIN HFA) 108 (90 Base) MCG/ACT inhaler Inhale 2 puffs into the lungs every 6 (six) hours as needed for wheezing or shortness of breath.    [provider]  amitriptyline (ELAVIL) 25 MG tablet Take 25 mg by mouth at bedtime.    [provider]  azithromycin (ZITHROMAX) 250 MG tablet Take 2 tablets PO on day 1, then take 1 tablet PO daily for 4 more days 05/25/19   Hinda Kehr, MD  HYDROcodone-acetaminophen (NORCO/VICODIN) 5-325 MG tablet Take 1 tablet by mouth every 4 (four) hours as needed for moderate pain. 05/21/19   [name], PA-C  naproxen (EC NAPROSYN) 500 MG EC tablet Take 1 tablet (500 mg total) by mouth 2 (two) times daily with a meal. 08/14/18  Jearld Fenton, NP  orphenadrine (NORFLEX) 100 MG tablet Take 1 tablet (100 mg total) by mouth 2 (two) times daily. 08/14/18 08/14/19  [place], [place] as needed. Goody powder    [provider]  pravastatin (PRAVACHOL) 80 MG tablet Take 80 mg by mouth daily.    [provider]  traZODone (DESYREL) 50 MG tablet Take 50 mg by mouth at bedtime.    [provider]    Allergies Patient has no known allergies.  No family history on file.  Social History Social History   Tobacco Use  . Smoking status: Current Every Day Smoker    Packs/day: 1.50    Years: 30.00    Pack years: 45.00    Types:  Cigarettes  . Smokeless tobacco: Never Used  Substance Use Topics  . Alcohol use: No  . Drug use: Not Currently    Types: Marijuana    Comment: in past    Review of Systems Level 5 caveat:  history/ROS limited by the patient being minimally cooperative and is very vague historian.  See HPI for details.   ____________________________________________   PHYSICAL EXAM:  VITAL SIGNS: ED Triage Vitals  Enc Vitals Group     BP 05/25/19 0008 (!) 113/97     Pulse Rate 05/25/19 0008 (!) 103     Resp 05/25/19 0008 19     Temp 05/25/19 0008 97.8 F (36.6 C)     Temp Source 05/25/19 0008 Oral     SpO2 05/24/19 2359 98 %     Weight 05/25/19 0005 86.2 kg (190 lb)     Height 05/25/19 0005 1.575 m (5\' 2" )     Head Circumference --      Peak Flow --      Pain Score 05/25/19 0005 0     Pain Loc --      Pain Edu? --      Excl. in [place]? --     Constitutional: Alert, somnolent, but awakens to loud voice and physical contact.  No acute distress.  Appears older than chronological age. Eyes: Conjunctivae are normal.  Head: Atraumatic. Nose: No congestion/rhinnorhea. Mouth/Throat: Mucous membranes are moist. Neck: No stridor.  No meningeal signs.   Cardiovascular: Normal rate, regular rhythm. Good peripheral circulation. Grossly normal heart sounds. Respiratory: Normal respiratory effort.  No retractions. No audible wheezing. Gastrointestinal: Soft and nontender. No distention.  Musculoskeletal: No lower extremity tenderness nor edema. No gross deformities of extremities. Neurologic:  Normal speech and language. No gross focal neurologic deficits are appreciated.  Skin:  Skin is warm, dry and intact. No rash noted.  ____________________________________________   LABS (all labs ordered are listed, but only abnormal results are displayed)  Labs Reviewed  BASIC METABOLIC PANEL - Abnormal; Notable for the following components:      Result Value   Glucose, Bld 109 (*)    Creatinine, Ser  1.55 (*)    GFR calc non Af Amer 37 (*)    GFR calc Af Amer 43 (*)    All other components within normal limits  NOVEL CORONAVIRUS, NAA (HOSPITAL ORDER, SEND-OUT TO REF LAB)  CBC  URINALYSIS, COMPLETE (UACMP) WITH MICROSCOPIC   ____________________________________________  EKG  ED ECG REPORT I, Hinda Kehr, the attending physician, personally viewed and interpreted this ECG.  Date: 05/25/2019 EKG Time: 00: 18 Rate: 103 Rhythm: Sinus tachycardia QRS Axis: Left axis deviation Intervals: normal ST/T Wave abnormalities: Non-specific ST segment / T-wave changes,  but no clear evidence of acute ischemia. Narrative Interpretation: no definitive evidence of acute ischemia; does not meet STEMI criteria.   ____________________________________________  RADIOLOGY   ED MD interpretation: Generalized interstitial opacity likely bronchitic in nature  Official radiology report(s): Dg Chest Portable 1 View  Result Date: 05/25/2019 CLINICAL DATA:  08/14/2013 EXAM: PORTABLE CHEST 1 VIEW COMPARISON:  08/14/2018 FINDINGS: Low volume chest with generalized interstitial coarsening. Borderline heart size accentuated by low volumes. No effusion or pneumothorax. IMPRESSION: Generalized interstitial opacity that is likely bronchitic. Electronically Signed   By: Monte Fantasia M.D.   On: 05/25/2019 06:40    ____________________________________________   PROCEDURES   Procedure(s) performed (including Critical Care):  Procedures   ____________________________________________   INITIAL IMPRESSION / MDM / [place] / ED COURSE  As part of my medical decision making, I reviewed the following data within the [place] notes reviewed and incorporated, Labs reviewed , EKG interpreted , Old chart reviewed, Radiograph reviewed  and Notes from prior ED visits   Differential diagnosis includes, but is not limited to, bronchitis, nonspecific viral infection, COPD,  COVID-19, pneumonia.  The patient is in no distress and waited for about 6 hours to be seen and then went to sleep.  She has a very odd affect but so does her son and it is unclear if this is baseline.  She is in no distress with reassuring vital signs.  Creatinine seems to be a little bit elevated over baseline and I gave her a liter of fluids for mild dehydration but there is no indication that she needs to be hospitalized at this time.  Given the bronchitic changes and the possible COVID-19 exposure versus atypical pneumonia, I provided a prescription for azithromycin.  I sent a nasal swab for the send out COVID test and explained to her that she should check the computer in a couple of days to check the result.  I explained to her about isolation and she is somewhat acknowledged my recommendations.  I gave my usual customary return precautions.  There is no evidence of an emergent medical condition at this time that would require either further evaluation or admission to the hospital.          ____________________________________________  FINAL CLINICAL IMPRESSION(S) / ED DIAGNOSES  Final diagnoses:  Dehydration  Acute kidney injury ([place])  Close Exposure to Covid-19 Virus     MEDICATIONS GIVEN DURING THIS VISIT:  Medications  sodium chloride 0.9 % bolus 1,000 mL (0 mLs Intravenous Stopped 05/25/19 0748)     ED Discharge Orders         Ordered    azithromycin (ZITHROMAX) 250 MG tablet     05/25/19 0932          *Please note:  [name] was evaluated in Emergency Department on 05/25/2019 for the symptoms described in the history of present illness. She was evaluated in the context of the global COVID-19 pandemic, which necessitated consideration that the patient might be at risk for infection with the SARS-CoV-2 virus that causes COVID-19. Institutional protocols and algorithms that pertain to the evaluation of patients at risk for COVID-19 are in a state of rapid change  based on information released by regulatory bodies including the CDC and federal and state organizations. These policies and algorithms were followed during the patient's care in the ED.  Some ED evaluations and interventions may be delayed as a result of limited staffing during the pandemic.*  Note:  This document was prepared using Dragon voice recognition software and may include unintentional dictation errors.   Hinda Kehr, MD 05/25/19 [phone]

## 2019-05-25 NOTE — ED Notes (Signed)
Pt's son discharged prior to pt. Per RN of son pt's son is obtaining a ride for both of them.

## 2019-05-25 NOTE — Discharge Instructions (Signed)
Please read to the COVID-19 instructions.  We sent a swab and you can get the results in about 2 days in MyChart.  Take the full course of antibiotics as prescribed and try to drink plenty of fluids and get plenty of rest; your labs look a little bit dehydrated tonight so we gave you a liter of fluids, but you need to make sure you drink plenty of fluids while you are at home.  Return to the emergency department if you develop new or worsening symptoms that concern you.

## 2019-05-26 LAB — NOVEL CORONAVIRUS, NAA (HOSP ORDER, SEND-OUT TO REF LAB; TAT 18-24 HRS): SARS-CoV-2, NAA: NOT DETECTED

## 2019-05-31 ENCOUNTER — Telehealth: Payer: Self-pay

## 2019-05-31 NOTE — Telephone Encounter (Signed)
Patient called in and received her results

## 2019-08-04 DIAGNOSIS — R413 Other amnesia: Secondary | ICD-10-CM | POA: Insufficient documentation

## 2019-10-12 ENCOUNTER — Ambulatory Visit: Payer: Medicare Other | Attending: Neurology

## 2019-10-18 ENCOUNTER — Other Ambulatory Visit: Payer: Self-pay | Admitting: Physician Assistant

## 2019-10-18 DIAGNOSIS — N644 Mastodynia: Secondary | ICD-10-CM

## 2019-12-21 ENCOUNTER — Other Ambulatory Visit: Payer: Self-pay | Admitting: Physician Assistant

## 2019-12-21 DIAGNOSIS — N644 Mastodynia: Secondary | ICD-10-CM

## 2020-01-05 ENCOUNTER — Other Ambulatory Visit: Payer: Self-pay

## 2020-01-05 ENCOUNTER — Encounter: Payer: Self-pay | Admitting: Unknown Physician Specialty

## 2020-01-11 ENCOUNTER — Other Ambulatory Visit
Admission: RE | Admit: 2020-01-11 | Discharge: 2020-01-11 | Disposition: A | Discharge status: Home / Self Care | Payer: Medicare Other | Source: Ambulatory Visit | Attending: Unknown Physician Specialty | Admitting: Unknown Physician Specialty

## 2020-01-11 ENCOUNTER — Other Ambulatory Visit: Payer: Self-pay

## 2020-01-11 DIAGNOSIS — Z20822 Contact with and (suspected) exposure to covid-19: Secondary | ICD-10-CM | POA: Insufficient documentation

## 2020-01-11 DIAGNOSIS — Z01812 Encounter for preprocedural laboratory examination: Secondary | ICD-10-CM | POA: Insufficient documentation

## 2020-01-11 LAB — SARS CORONAVIRUS 2 (TAT 6-24 HRS): SARS Coronavirus 2: NEGATIVE

## 2020-01-13 ENCOUNTER — Other Ambulatory Visit: Payer: Self-pay

## 2020-01-13 ENCOUNTER — Encounter
Admission: RE | Disposition: A | Discharge status: Home / Self Care | Payer: Self-pay | Source: Home / Self Care | Attending: Unknown Physician Specialty

## 2020-01-13 ENCOUNTER — Ambulatory Visit: Payer: Medicare Other | Admitting: Anesthesiology

## 2020-01-13 ENCOUNTER — Ambulatory Visit
Admission: RE | Admit: 2020-01-13 | Discharge: 2020-01-13 | Disposition: A | Discharge status: Home / Self Care | Payer: Medicare Other | Attending: Unknown Physician Specialty | Admitting: Unknown Physician Specialty

## 2020-01-13 ENCOUNTER — Encounter: Payer: Self-pay | Admitting: Unknown Physician Specialty

## 2020-01-13 DIAGNOSIS — D141 Benign neoplasm of larynx: Secondary | ICD-10-CM | POA: Insufficient documentation

## 2020-01-13 DIAGNOSIS — J449 Chronic obstructive pulmonary disease, unspecified: Secondary | ICD-10-CM | POA: Diagnosis not present

## 2020-01-13 DIAGNOSIS — Z7984 Long term (current) use of oral hypoglycemic drugs: Secondary | ICD-10-CM | POA: Insufficient documentation

## 2020-01-13 DIAGNOSIS — Z79899 Other long term (current) drug therapy: Secondary | ICD-10-CM | POA: Insufficient documentation

## 2020-01-13 DIAGNOSIS — M1990 Unspecified osteoarthritis, unspecified site: Secondary | ICD-10-CM | POA: Diagnosis not present

## 2020-01-13 DIAGNOSIS — F17210 Nicotine dependence, cigarettes, uncomplicated: Secondary | ICD-10-CM | POA: Insufficient documentation

## 2020-01-13 DIAGNOSIS — J381 Polyp of vocal cord and larynx: Secondary | ICD-10-CM | POA: Diagnosis present

## 2020-01-13 DIAGNOSIS — Z6838 Body mass index (BMI) 38.0-38.9, adult: Secondary | ICD-10-CM | POA: Diagnosis not present

## 2020-01-13 DIAGNOSIS — Z791 Long term (current) use of non-steroidal anti-inflammatories (NSAID): Secondary | ICD-10-CM | POA: Diagnosis not present

## 2020-01-13 DIAGNOSIS — E119 Type 2 diabetes mellitus without complications: Secondary | ICD-10-CM | POA: Insufficient documentation

## 2020-01-13 HISTORY — DX: Type 2 diabetes mellitus without complications: E11.9

## 2020-01-13 HISTORY — PX: MICROLARYNGOSCOPY: SHX5208

## 2020-01-13 LAB — GLUCOSE, CAPILLARY
Glucose-Capillary: 200 mg/dL — ABNORMAL HIGH (ref 70–99)
Glucose-Capillary: 177 mg/dL — ABNORMAL HIGH (ref 70–99)

## 2020-01-13 SURGERY — MICROLARYNGOSCOPY
Anesthesia: General | Site: Throat | Laterality: Right

## 2020-01-13 MED ORDER — FENTANYL CITRATE (PF) 100 MCG/2ML IJ SOLN: 25.0000 ug | INTRAMUSCULAR | Status: DC | PRN

## 2020-01-13 MED ORDER — SUCCINYLCHOLINE CHLORIDE 20 MG/ML IJ SOLN
INTRAMUSCULAR | Status: DC | PRN
  Administered 2020-01-13: 80 mg via INTRAVENOUS

## 2020-01-13 MED ORDER — PROPOFOL 10 MG/ML IV BOLUS
INTRAVENOUS | Status: DC | PRN
  Administered 2020-01-13: 120 mg via INTRAVENOUS
  Administered 2020-01-13: 30 mg via INTRAVENOUS
  Administered 2020-01-13: 20 mg via INTRAVENOUS

## 2020-01-13 MED ORDER — MIDAZOLAM HCL 5 MG/5ML IJ SOLN
INTRAMUSCULAR | Status: DC | PRN
  Administered 2020-01-13: 2 mg via INTRAVENOUS

## 2020-01-13 MED ORDER — LIDOCAINE HCL (CARDIAC) PF 100 MG/5ML IV SOSY
PREFILLED_SYRINGE | INTRAVENOUS | Status: DC | PRN
  Administered 2020-01-13: 40 mg via INTRAVENOUS

## 2020-01-13 MED ORDER — OXYCODONE HCL 5 MG PO TABS: 5.0000 mg | ORAL_TABLET | Freq: Once | ORAL | Status: AC | PRN

## 2020-01-13 MED ORDER — ACETAMINOPHEN 325 MG PO TABS: 325.0000 mg | ORAL_TABLET | ORAL | Status: DC | PRN

## 2020-01-13 MED ORDER — PHENYLEPHRINE HCL 0.5 % NA SOLN
NASAL | Status: DC | PRN
  Administered 2020-01-13: 30 mL via TOPICAL

## 2020-01-13 MED ORDER — FENTANYL CITRATE (PF) 100 MCG/2ML IJ SOLN
INTRAMUSCULAR | Status: DC | PRN
  Administered 2020-01-13 (×2): 25 ug via INTRAVENOUS
  Administered 2020-01-13: 50 ug via INTRAVENOUS

## 2020-01-13 MED ORDER — OXYCODONE HCL 5 MG/5ML PO SOLN
5.0000 mg | Freq: Once | ORAL | Status: AC | PRN
  Administered 2020-01-13: 5 mg via ORAL

## 2020-01-13 MED ORDER — GLYCOPYRROLATE 0.2 MG/ML IJ SOLN
INTRAMUSCULAR | Status: DC | PRN
  Administered 2020-01-13: .1 mg via INTRAVENOUS

## 2020-01-13 MED ORDER — ACETAMINOPHEN 160 MG/5ML PO SOLN: 325.0000 mg | ORAL | Status: DC | PRN

## 2020-01-13 MED ORDER — DEXAMETHASONE SODIUM PHOSPHATE 4 MG/ML IJ SOLN
INTRAMUSCULAR | Status: DC | PRN
  Administered 2020-01-13: 10 mg via INTRAVENOUS

## 2020-01-13 MED ORDER — LACTATED RINGERS IV SOLN: INTRAVENOUS | Status: DC

## 2020-01-13 MED ORDER — ONDANSETRON HCL 4 MG/2ML IJ SOLN
INTRAMUSCULAR | Status: DC | PRN
  Administered 2020-01-13: 4 mg via INTRAVENOUS

## 2020-01-13 SURGICAL SUPPLY — 23 items
BASIN GRAD PLASTIC 32OZ STRL (MISCELLANEOUS) ×2 IMPLANT
BLOCK BITE GUARD (MISCELLANEOUS) ×2 IMPLANT
CONT SPEC 4OZ CLIKSEAL STRL BL (MISCELLANEOUS) ×2 IMPLANT
COVER MAYO STAND STRL (DRAPES) ×2 IMPLANT
COVER TABLE BACK 60X90 (DRAPES) ×2 IMPLANT
CUP MEDICINE 2OZ PLAST GRAD ST (MISCELLANEOUS) ×2 IMPLANT
DRAPE SHEET LG 3/4 BI-LAMINATE (DRAPES) ×2 IMPLANT
DRSG TELFA 4X3 1S NADH ST (GAUZE/BANDAGES/DRESSINGS) ×2 IMPLANT
GLOVE BIO SURGEON STRL SZ7.5 (GLOVE) ×3 IMPLANT
KIT TURNOVER KIT A (KITS) ×2 IMPLANT
MARKER SKIN DUAL TIP RULER LAB (MISCELLANEOUS) ×2 IMPLANT
NDL FILTER BLUNT 18X1 1/2 (NEEDLE) ×1 IMPLANT
NEEDLE FILTER BLUNT 18X 1/2SAF (NEEDLE) ×1
NEEDLE FILTER BLUNT 18X1 1/2 (NEEDLE) ×1 IMPLANT
NS IRRIG 500ML POUR BTL (IV SOLUTION) ×2 IMPLANT
PATTIES SURGICAL .5 X.5 (GAUZE/BANDAGES/DRESSINGS) ×2 IMPLANT
SOL ANTI-FOG 6CC FOG-OUT (MISCELLANEOUS) ×1 IMPLANT
SOL FOG-OUT ANTI-FOG 6CC (MISCELLANEOUS) ×1
SPONGE XRAY 4X4 16PLY STRL (MISCELLANEOUS) ×2 IMPLANT
STRAP BODY AND KNEE 60X3 (MISCELLANEOUS) ×2 IMPLANT
TOWEL OR 17X26 4PK STRL BLUE (TOWEL DISPOSABLE) ×2 IMPLANT
TUBING CONN 6MMX3.1M (TUBING) ×1
TUBING SUCTION CONN 0.25 STRL (TUBING) ×1 IMPLANT

## 2020-01-13 NOTE — Anesthesia Procedure Notes (Addendum)
Procedure Name: Intubation Date/Time: 01/13/2020 10:31 AM Performed by: Mayme Genta, CRNA Pre-anesthesia Checklist: Patient identified, Emergency Drugs available, Suction available, Patient being monitored and Timeout performed Patient Re-evaluated:Patient Re-evaluated prior to induction Oxygen Delivery Method: Circle system utilized Preoxygenation: Pre-oxygenation with 100% oxygen Induction Type: IV induction Ventilation: Mask ventilation without difficulty Laryngoscope Size: Miller and 2 Grade View: Grade I Tube type: MLT Tube size: 6.0 mm Number of attempts: 1 Placement Confirmation: ETT inserted through vocal cords under direct vision,  positive ETCO2 and breath sounds checked- equal and bilateral Secured at: 22 cm Tube secured with: Tape Dental Injury: Teeth and Oropharynx as per pre-operative assessment

## 2020-01-13 NOTE — Discharge Instructions (Signed)
Laryngoscopy, Care After This sheet gives you information about how to care for yourself after your procedure. Your health care provider may also give you more specific instructions. If you have problems or questions, contact your health care provider. What can I expect after the procedure? After the procedure, it is common to have:  A sore throat.  A hoarse voice.  A temporary change in how your voice sounds. Follow these instructions at home: Medicines  Take over-the-counter and prescription medicines only as told by your health care provider. Driving   Do not drive for 24 hours if you were given a sedative during your procedure. General instructions   Return to your normal activities as told by your health care provider. Ask your health care provider what activities are safe for you. ? If your laryngoscopy was done using medicine to numb only your throat (local anesthetic), you may be able to return to your normal activities right away.  If your health care provider took tissue from your throat for lab testing (biopsy), it is up to you to get your test results. Ask your health care provider, or the department that did the procedure, when your results will be ready.  Do not use any products that contain nicotine or tobacco, such as cigarettes and e-cigarettes. If you need help quitting, ask your health care provider.  Follow instructions from your health care provider about eating or drinking restrictions.  Keep all follow-up visits as told by your health care provider. This is important. Contact a health care provider if:  You have a fever.  You develop a cough.  You develop nausea and vomiting. Get help right away if:  You have severe pain.  You have chest pain.  You have new bleeding during coughing, spitting, or vomiting.  You develop new problems with swallowing.  You have difficulty breathing. Summary  After a laryngoscopy it is common to have a sore throat,  a hoarse voice, or a temporary change in the sound of your voice.  Take over the counter and prescription medicines as told by your health care provider.  Get help right away if you have difficulty breathing after this procedure.  Keep all follow-up visits as told by your health care provider. This is important. This information is not intended to replace advice given to you by your health care provider. Make sure you discuss any questions you have with your health care provider. Document Revised: 02/24/2018 Document Reviewed: 02/24/2018 Elsevier Patient Education  2020 Elsevier Inc.   General Anesthesia, Adult, Care After This sheet gives you information about how to care for yourself after your procedure. Your health care provider may also give you more specific instructions. If you have problems or questions, contact your health care provider. What can I expect after the procedure? After the procedure, the following side effects are common:  Pain or discomfort at the IV site.  Nausea.  Vomiting.  Sore throat.  Trouble concentrating.  Feeling cold or chills.  Weak or tired.  Sleepiness and fatigue.  Soreness and body aches. These side effects can affect parts of the body that were not involved in surgery. Follow these instructions at home:  For at least 24 hours after the procedure:  Have a responsible adult stay with you. It is important to have someone help care for you until you are awake and alert.  Rest as needed.  Do not: ? Participate in activities in which you could fall or become injured. ? Drive. ?   Use heavy machinery. ? Drink alcohol. ? Take sleeping pills or medicines that cause drowsiness. ? Make important decisions or sign legal documents. ? Take care of children on your own. Eating and drinking  Follow any instructions from your health care provider about eating or drinking restrictions.  When you feel hungry, start by eating small amounts of  foods that are soft and easy to digest (bland), such as toast. Gradually return to your regular diet.  Drink enough fluid to keep your urine pale yellow.  If you vomit, rehydrate by drinking water, juice, or clear broth. General instructions  If you have sleep apnea, surgery and certain medicines can increase your risk for breathing problems. Follow instructions from your health care provider about wearing your sleep device: ? Anytime you are sleeping, including during daytime naps. ? While taking prescription pain medicines, sleeping medicines, or medicines that make you drowsy.  Return to your normal activities as told by your health care provider. Ask your health care provider what activities are safe for you.  Take over-the-counter and prescription medicines only as told by your health care provider.  If you smoke, do not smoke without supervision.  Keep all follow-up visits as told by your health care provider. This is important. Contact a health care provider if:  You have nausea or vomiting that does not get better with medicine.  You cannot eat or drink without vomiting.  You have pain that does not get better with medicine.  You are unable to pass urine.  You develop a skin rash.  You have a fever.  You have redness around your IV site that gets worse. Get help right away if:  You have difficulty breathing.  You have chest pain.  You have blood in your urine or stool, or you vomit blood. Summary  After the procedure, it is common to have a sore throat or nausea. It is also common to feel tired.  Have a responsible adult stay with you for the first 24 hours after general anesthesia. It is important to have someone help care for you until you are awake and alert.  When you feel hungry, start by eating small amounts of foods that are soft and easy to digest (bland), such as toast. Gradually return to your regular diet.  Drink enough fluid to keep your urine pale  yellow.  Return to your normal activities as told by your health care provider. Ask your health care provider what activities are safe for you. This information is not intended to replace advice given to you by your health care provider. Make sure you discuss any questions you have with your health care provider. Document Revised: 10/30/2017 Document Reviewed: 06/12/2017 Elsevier Patient Education  2020 Elsevier Inc.  

## 2020-01-13 NOTE — Op Note (Signed)
01/13/2020  10:49 AM    Alycia Rossetti  ZC:[id number]   Pre-Op Dx: larynx neoplasm uncertain behavior  Post-op Dx: SAME  Proc: Microlaryngoscopy with excision of right vocal cord polyp  Surg:  Roena Malady  Anes:  GOT  EBL: Less than 5 cc  Comp: None  Findings: Polypoid mass right vocal cord  Procedure: Venisha was identified in the holding area taken the operating room placed in supine position.  After general endotracheal anesthesia table was turned 90 degrees.  The patient was edentulous.  4 x 4 gauze was placed over the upper maxilla.  A Dedo laryngoscope was introduced into the airway and suspended.  A 0 degree endoscope was introduced into the airway examination of the larynx showed a right vocal cord polyp cottonoid pledget with phenylephrine lidocaine solution was placed within the larynx.  The operating microscope was then brought into the field.  The pledget was removed.  Using the microscope the larynx was again examined there was a polypoid mass on the right vocal fold.  Shapshay microlaryngeal instruments were then brought into the field.  A small cup forcep was used to grasp the polyp in the 45 degrees Shapshay scissors were used to dissected free from the vocal fold.  This allowed the vocal cord mucosa to reapproximate nicely.  Cottonoid pledget was then replaced for hemostasis at approximate 5 minutes this was removed.  The documentation was performed prior to and following removal of the polyp.  The patient was then returned to anesthesia where she was awakened in the operating take recovery room in stable condition.  Specimen: Right vocal cord polyp  Dispo:   Good  Plan: Discharged home she is to avoid smoking.  Roena Malady  01/13/2020 10:49 AM

## 2020-01-13 NOTE — H&P (Signed)
The patient's history has been reviewed, patient examined, no change in status, stable for surgery.  Questions were answered to the patients satisfaction.  

## 2020-01-13 NOTE — Anesthesia Preprocedure Evaluation (Signed)
Anesthesia Evaluation  Patient identified by MRN, date of birth, ID band Patient awake    Airway Mallampati: II  TM Distance: >3 FB Neck ROM: Full    Dental  (+) Edentulous Upper, Edentulous Lower   Pulmonary asthma , COPD, Current SmokerPatient did not abstain from smoking.,    Pulmonary exam normal        Cardiovascular Exercise Tolerance: Poor negative cardio ROS Normal cardiovascular exam  deconditioned   Neuro/Psych    GI/Hepatic negative GI ROS,   Endo/Other  diabetes, Well Controlled, Type 2, Oral Hypoglycemic AgentsMorbid obesity  Renal/GU      Musculoskeletal  (+) Arthritis ,   Abdominal   Peds  Hematology   Anesthesia Other Findings   Reproductive/Obstetrics                             Anesthesia Physical Anesthesia Plan  ASA: III  Anesthesia Plan: General   Post-op Pain Management:    Induction: Intravenous  PONV Risk Score and Plan: 2 and Treatment may vary due to age or medical condition, Ondansetron and Dexamethasone  Airway Management Planned: Oral ETT  Additional Equipment: None  Intra-op Plan:   Post-operative Plan: Extubation in OR  Informed Consent: I have reviewed the patients History and Physical, chart, labs and discussed the procedure including the risks, benefits and alternatives for the proposed anesthesia with the patient or authorized representative who has indicated his/her understanding and acceptance.       Plan Discussed with: CRNA  Anesthesia Plan Comments:         Anesthesia Quick Evaluation

## 2020-01-13 NOTE — Transfer of Care (Signed)
Immediate Anesthesia Transfer of Care Note  Patient: Sally Hughes  Procedure(s) Performed: MICROLARYNGOSCOPY WITH EXCISION OF VC LESION (Right Throat)  Patient Location: PACU  Anesthesia Type: General  Level of Consciousness: awake, alert  and patient cooperative  Airway and Oxygen Therapy: Patient Spontanous Breathing and Patient connected to supplemental oxygen  Post-op Assessment: Post-op Vital signs reviewed, Patient's Cardiovascular Status Stable, Respiratory Function Stable, Patent Airway and No signs of Nausea or vomiting  Post-op Vital Signs: Reviewed and stable  Complications: No apparent anesthesia complications

## 2020-01-13 NOTE — Anesthesia Postprocedure Evaluation (Signed)
Anesthesia Post Note  Patient: Sally Hughes  Procedure(s) Performed: MICROLARYNGOSCOPY WITH EXCISION OF VC LESION (Right Throat)     Patient location during evaluation: PACU Anesthesia Type: General Level of consciousness: awake and alert Pain management: pain level controlled Vital Signs Assessment: post-procedure vital signs reviewed and stable Respiratory status: spontaneous breathing, nonlabored ventilation, respiratory function stable and patient connected to nasal cannula oxygen Cardiovascular status: blood pressure returned to baseline and stable Postop Assessment: no apparent nausea or vomiting Anesthetic complications: no    Adele Barthel Zachary Lovins

## 2020-01-16 ENCOUNTER — Encounter: Payer: Self-pay | Admitting: *Deleted

## 2020-01-17 LAB — SURGICAL PATHOLOGY

## 2020-05-01 ENCOUNTER — Ambulatory Visit (INDEPENDENT_AMBULATORY_CARE_PROVIDER_SITE_OTHER): Payer: Medicare HMO | Admitting: Licensed Clinical Social Worker

## 2020-05-01 ENCOUNTER — Other Ambulatory Visit: Payer: Self-pay

## 2020-05-01 DIAGNOSIS — F331 Major depressive disorder, recurrent, moderate: Secondary | ICD-10-CM

## 2020-05-01 DIAGNOSIS — F411 Generalized anxiety disorder: Secondary | ICD-10-CM | POA: Diagnosis not present

## 2020-05-01 NOTE — Patient Instructions (Signed)
Caring for Your Mental Health Mental health is emotional, psychological, and social well-being. Mental health is just as important as physical health. In fact, mental and physical health are connected, and you need both to be healthy. Some signs of good mental health (well-being) include:  Being able to attend to tasks at home, school, or work.  Being able to manage stress and emotions.  Practicing self-care, which may include: ? A regular exercise pattern. ? A reasonably healthy diet. ? Supportive and trusting relationships. ? The ability to relax and calm yourself (self-calm).  Having pleasurable hobbies and activities to do.  Believing that you have meaning and purpose in your life.  Recovering and adjusting after facing challenges (resilience). You can take steps to build or strengthen these mentally healthy behaviors. There are resources and support to help you with this. Why is caring for mental health important? Caring for your mental health is a big part of staying healthy. Everyone has times when feelings, thoughts, or situations feel overwhelming. Mental health means having the skills to manage what feels overwhelming. If this sense of being overwhelmed persists, however, you might need some help. If you have some of the following signs, you may need to take better care of your mental health or seek help from a health care provider or mental health professional:  Problems with energy or focus.  Changes in eating habits.  Problems sleeping, such as sleeping too much or not enough.  Emotional distress, such as anger, sadness, depression, or anxiety.  Major changes in your relationships.  Losing interest in life or activities that you used to enjoy. If you have any of these symptoms on most days for 2 weeks or longer:  Talk with a close friend or family member about how you are feeling.  Contact your health care provider to discuss your symptoms.  Consider working with a  Education officer, community. Your health care provider, family, or friends may be able to recommend a therapist. What can I do to promote emotional and mental health? Managing emotions  Learn to identify emotions and deal with them. Recognizing your emotions is the first step in learning to deal with them.  Practice ways to appropriately express feelings. Remember that you can control your feelings. They do not control you.  Practice stress management techniques, such as: ? Relaxation techniques, like breathing or muscle relaxation exercises. ? Exercise. Regular activity can lower your stress level. ? Changing what you can change and accepting what you cannot change.  Build up your resilience so that you can recover and adjust after big problems or challenges. Practice resilient behaviors and attitudes: ? Set and focus on long-term goals. ? Develop and maintain healthy, supportive relationships. ? Learn to accept change and make the best of the situation. ? Take care of yourself physically by eating a healthy diet, getting plenty of sleep, and exercising regularly. ? Develop self-awareness. Ask others to give feedback about how they see you. ? Practice mindfulness meditation to help you stay calm when dealing with daily challenges. ? Learn to respond to situations in healthy ways, rather than reacting with your emotions. ? Keep a positive attitude, and believe in yourself. Your view of yourself affects your mental health. ? Develop your listening and empathy skills. These will help you deal with difficult situations and communications.  Remember that emotions can be used as a good source of communication and are a great source of energy. Try to laugh and find humor in life.  Sleeping  Get the right amount and quality of sleep. Sleep has a big impact on physical and mental health. To improve your sleep: ? Go to bed and wake up around the same time every day. ? Limit screen time before  bedtime. This includes the use of your cell phone, TV, computer, and tablet. ? Keep your bedroom dark and cool. Activity   Exercise or do some physical activity regularly. This helps: ? Keep your body strong, especially during times of stress. ? Get rid of chemicals in your body (hormones) that build up when you are stressed. ? Build up your resilience. Eating and drinking   Eat a healthy diet that includes whole grains, vegetables, fresh fruits, and lean proteins. If you have questions about what foods are best for you, ask your health care provider.  Try not to turn to sweet, salty, or otherwise unhealthy foods when you are tired or unhappy. This can lead to unwanted weight gain and is not a healthy way to cope with emotions. Where to find more information You can find more information about how to care for your mental health from:  National Alliance on Mental Illness (NAMI): www.nami.org  National Institute of Mental Health: www.nimh.nih.gov  Centers for Disease Control and Prevention: www.cdc.gov/hrqol/wellbeing.htm Contact a health care provider if:  You lose interest in being with others or you do not want to leave the house.  You have a hard time completing your normal activities or you have less energy than normal.  You cannot stay focused or you have problems with memory.  You feel that your senses are heightened, and this makes you upset or concerned.  You feel nervous or have rapid mood changes.  You are sleeping or eating more or less than normal.  You question reality or you show odd behavior that disturbs you or others. Get help right away if:  You have thoughts about hurting yourself or others. If you ever feel like you may hurt yourself or others, or have thoughts about taking your own life, get help right away. You can go to your nearest emergency department or call:  Your local emergency services (911 in the [place]  A suicide crisis helpline, such as the  National Suicide Prevention Lifeline at 1-800-273-8255. This is open 24 hours a day. Summary  Mental health is not just the absence of mental illness. It involves understanding your emotions and behaviors, and taking steps to cope with them in a healthy way.  If you have symptoms of mental or emotional distress, get help from family, friends, a health care provider, or a mental health professional.  Practice good mental health behaviors such as stress management skills, self-calming skills, exercise, and healthy sleeping and eating. This information is not intended to replace advice given to you by your health care provider. Make sure you discuss any questions you have with your health care provider. Document Revised: 10/09/2017 Document Reviewed: 03/10/2017 Elsevier Patient Education  2020 Elsevier Inc.  
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
## 2020-05-03 ENCOUNTER — Other Ambulatory Visit (HOSPITAL_COMMUNITY): Payer: Self-pay | Admitting: General Surgery

## 2020-05-03 ENCOUNTER — Other Ambulatory Visit: Payer: Self-pay | Admitting: General Surgery

## 2020-05-03 DIAGNOSIS — K432 Incisional hernia without obstruction or gangrene: Secondary | ICD-10-CM

## 2020-05-09 ENCOUNTER — Ambulatory Visit
Admission: RE | Admit: 2020-05-09 | Discharge: 2020-05-09 | Disposition: A | Discharge status: Home / Self Care | Payer: Medicare HMO | Source: Ambulatory Visit | Attending: General Surgery | Admitting: General Surgery

## 2020-05-09 ENCOUNTER — Other Ambulatory Visit: Payer: Self-pay

## 2020-05-09 DIAGNOSIS — K432 Incisional hernia without obstruction or gangrene: Secondary | ICD-10-CM | POA: Diagnosis not present

## 2020-05-09 IMAGING — CT CT ABD-PELV W/O CM
2 of 4 series · 16 of 46 positions shown, 18 images · non-contrast
Comparison: [DATE]

CLINICAL DATA: Lump superior to the umbilicus.

EXAM:
CT ABDOMEN AND PELVIS WITHOUT CONTRAST
TECHNIQUE: Multidetector CT imaging of the abdomen and pelvis was performed
following the standard protocol without IV contrast.

[Series 2: axials routine abdomen pelvis without 5.00 · axial · non-contrast · 0.80mm/px · z∈[-1450,-1035]mm · 13 of 91 slices shown, 15 images]
[im 4/91  soft-tissue]
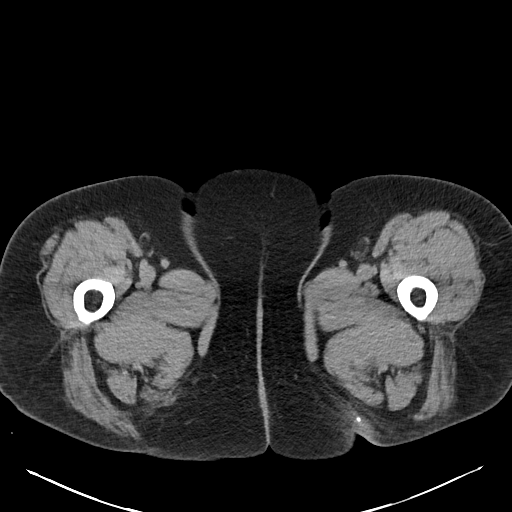
[im 4/91  bone]
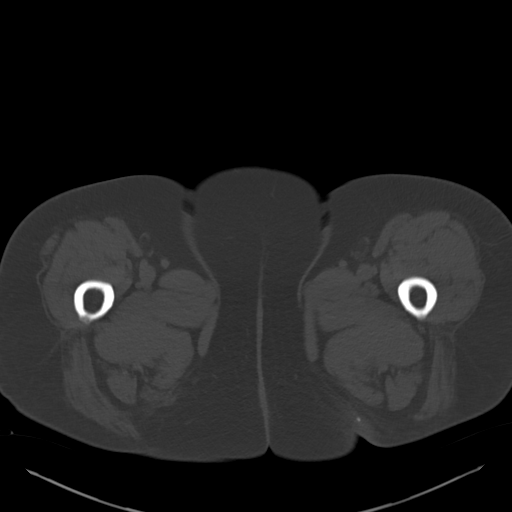
[im 11/91  soft-tissue]
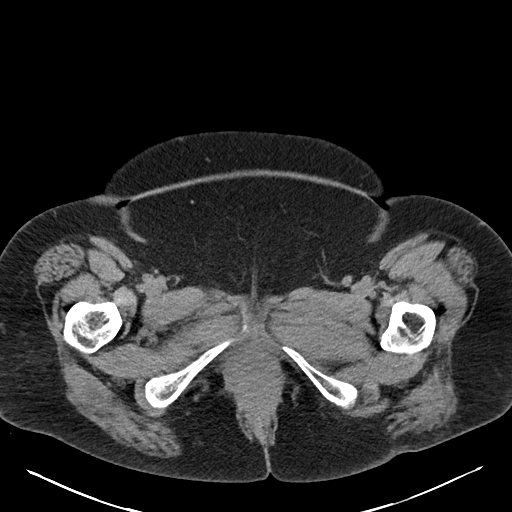
[im 19/91  soft-tissue]
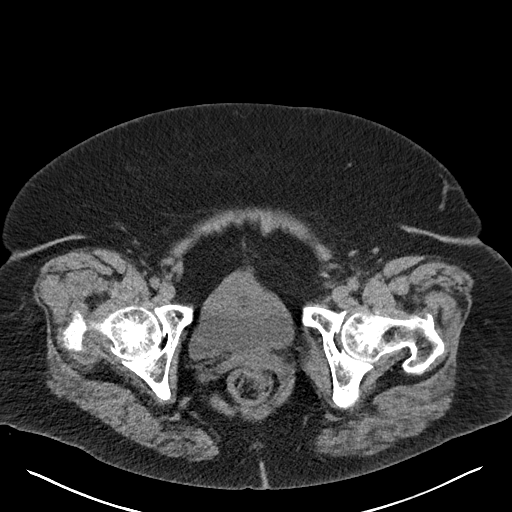
[im 26/91  soft-tissue]
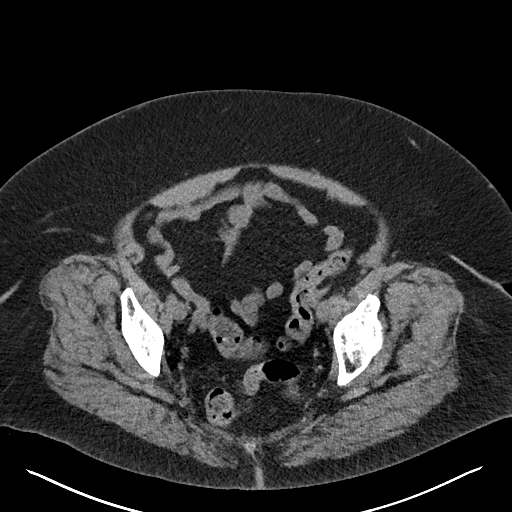
[im 33/91  soft-tissue]
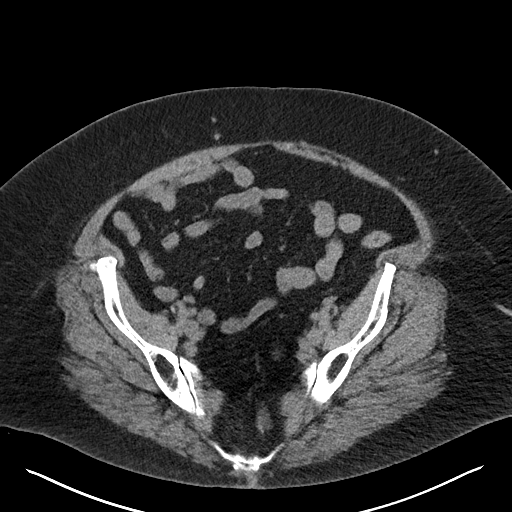
[im 40/91  soft-tissue]
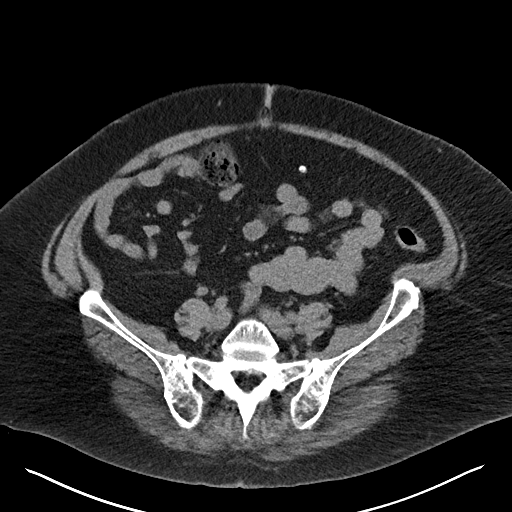
[im 47/91  soft-tissue]
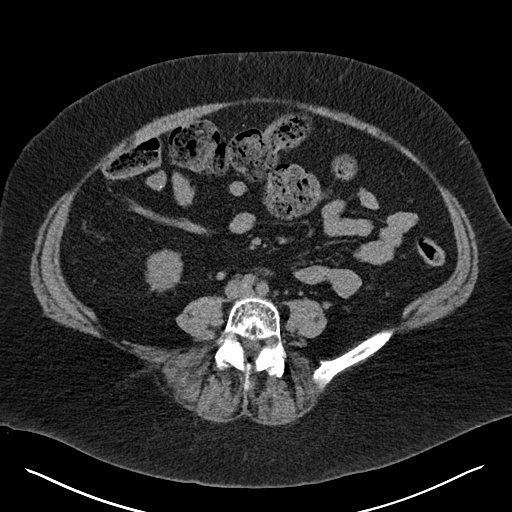
[im 51/91  soft-tissue]
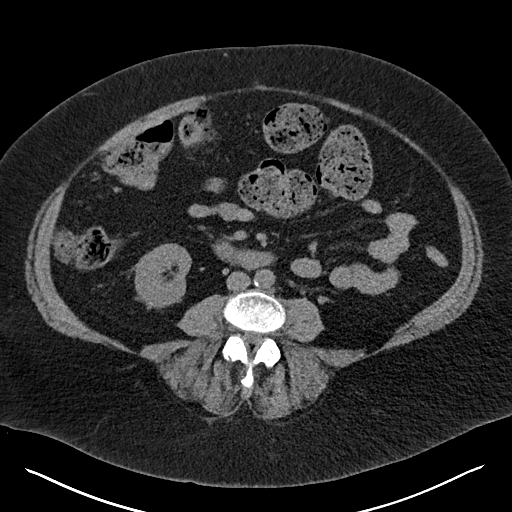
[im 58/91  soft-tissue]
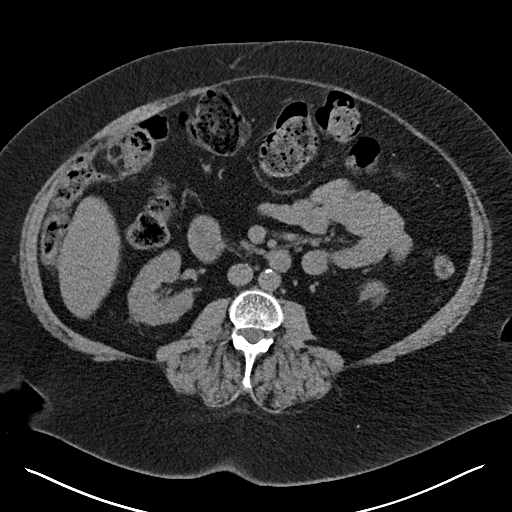
[im 58/91  bone]
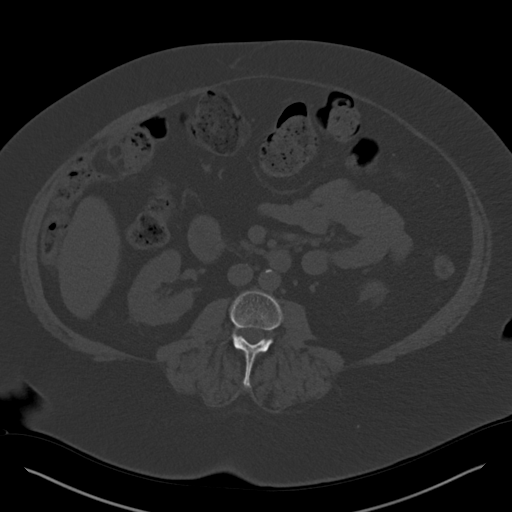
[im 65/91  soft-tissue]
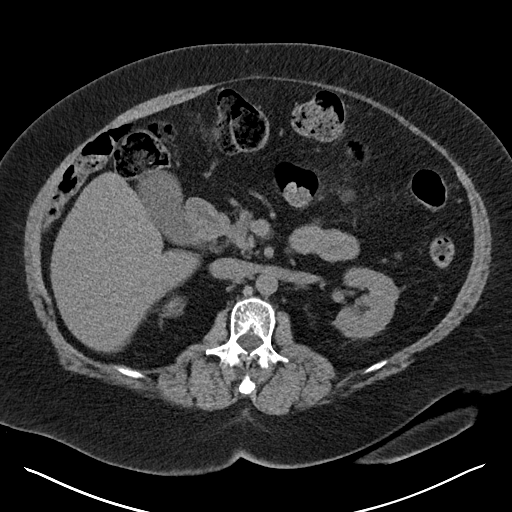
[im 73/91  soft-tissue]
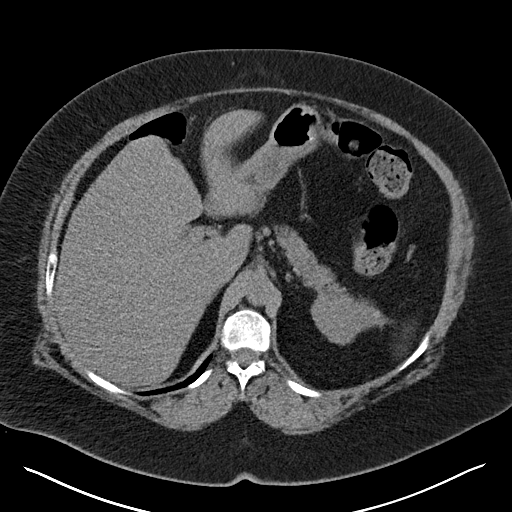
[im 80/91  soft-tissue]
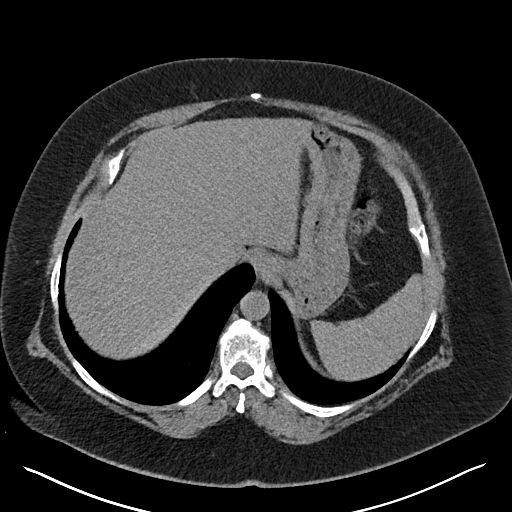
[im 87/91  soft-tissue]
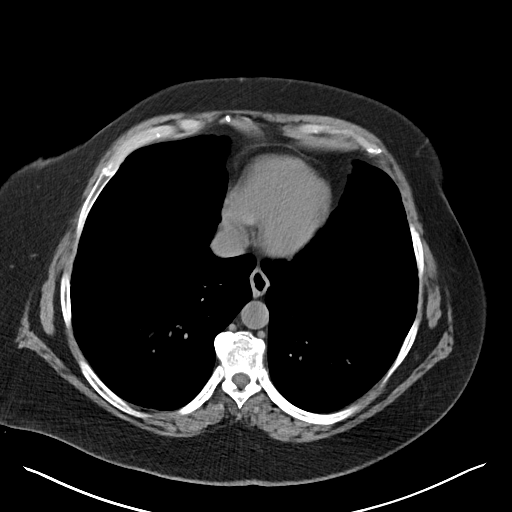

[Series 4: coronals routine abdomen pelvis without 2.00 cor · coronal · non-contrast · 0.80mm/px · 3 of 179 slices shown]
[im 60/179  soft-tissue]
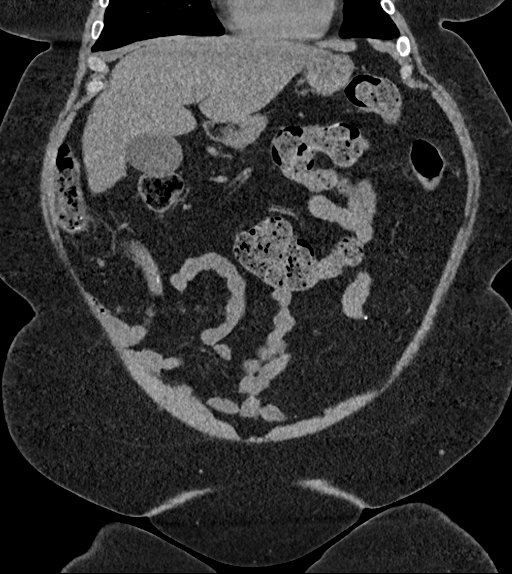
[im 80/179  soft-tissue]
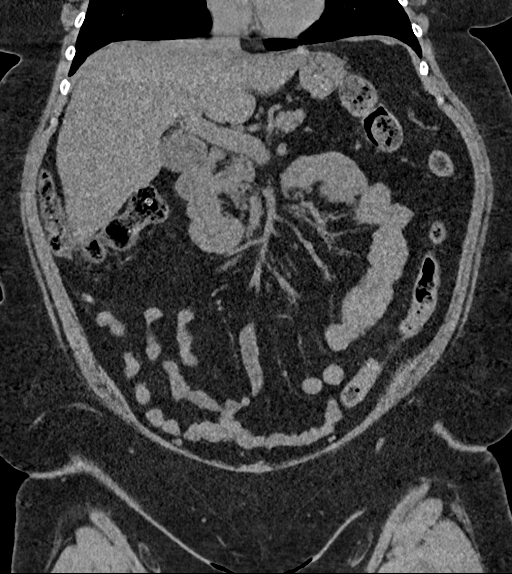
[im 99/179  soft-tissue]
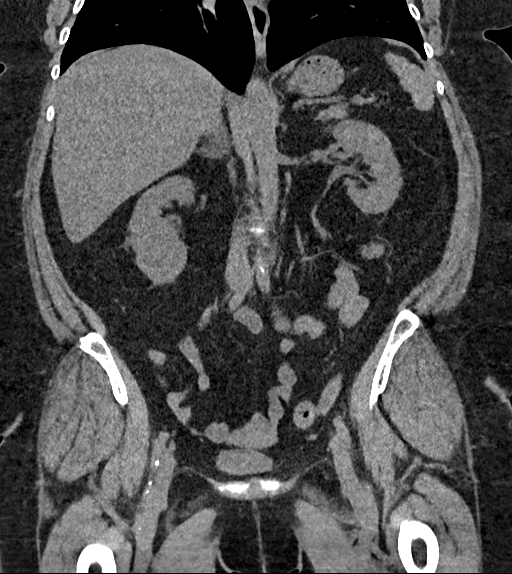

[16 of 46 positions shown; findings below may reference images not displayed]

FINDINGS: Lower chest: The lung bases are clear. The heart size is normal.

Hepatobiliary: There is decreased hepatic attenuation suggestive of
hepatic steatosis. Normal gallbladder.There is no biliary ductal
dilation.

Pancreas: Normal contours without ductal dilatation. No
peripancreatic fluid collection.

Spleen: Unremarkable.

Adrenals/Urinary Tract:

--Adrenal glands: Unremarkable.

--Right kidney/ureter: No hydronephrosis or radiopaque kidney
stones.

--Left kidney/ureter: No hydronephrosis or radiopaque kidney stones.

--Urinary bladder: Unremarkable.

Stomach/Bowel:

--Stomach/Duodenum: No hiatal hernia or other gastric abnormality.
Normal duodenal course and caliber.

--Small bowel: Unremarkable.

--Colon: There is a large amount of stool in the colon.

--Appendix: Normal.

Vascular/Lymphatic: Atherosclerotic calcification is present within
the non-aneurysmal abdominal aorta, without hemodynamically
significant stenosis.

--No retroperitoneal lymphadenopathy.

--No mesenteric lymphadenopathy.

--No pelvic or inguinal lymphadenopathy.

Reproductive: Unremarkable

Other: No ascites or free air. There is a small fat containing
umbilical hernia. In the superior midline abdomen, there is a fat
containing ventral wall hernia measuring approximately 4.4 cm with
an abdominal wall defect measuring approximately 2 cm (axial series
2, image 17).

Musculoskeletal. There are degenerative changes of both hips.
IMPRESSION: 1. Fat containing ventral wall hernia and small fat containing
umbilical hernia.
2. Hepatic steatosis.

Aortic Atherosclerosis ([D9]-[D9]).

## 2020-05-29 ENCOUNTER — Ambulatory Visit: Payer: Self-pay | Admitting: General Surgery

## 2020-05-29 NOTE — H&P (View-Only) (Signed)
HISTORY OF PRESENT ILLNESS:    Sally Hughes is a 59 y.o.female patient who comes for follow up and discussion of CT scan.  Patient with suspected incisional hernia in the midline abdomen.  Due to having a large left subcostal incision CT scan was ordered for complete evaluation of complex incisional hernia.  The patient reported she continued having pain in the midline.  She reports having a bulging that comes in and out daily.  The pain does not radiate to other part of the body.  There is no alleviating or aggravating factor at this moment.  Patient denies any abdominal distention nausea or vomiting.  Patient tolerating regular diet.  Impression elevated the CT scan of the abdominal pelvis.  I showed the patient the hernia that is 2 cm on the epigastric area.  There is no sign of hernia on the left subcostal area.  There is no sign of bowel obstruction.      PAST MEDICAL HISTORY:      Past Medical History:  Diagnosis Date  . Anxiety   . Anxiety   . COPD (chronic obstructive pulmonary disease) (CMS-HCC)   . Depression (emotion)   . Diabetes mellitus without complication (CMS-HCC)   . Hypertension   . Low back pain   . Migraine headache         PAST SURGICAL HISTORY:        Past Surgical History:  Procedure Laterality Date  . EXPLORATION KIDNEY    . EYELID SURGERY           MEDICATIONS:  Encounter Medications        Outpatient Encounter Medications as of 05/29/2020  Medication Sig Dispense Refill  . acetaminophen (TYLENOL) 500 MG tablet Take by mouth every 6 (six) hours    . amitriptyline (ELAVIL) 75 MG tablet Take 1 tablet (75 mg total) by mouth nightly 90 tablet 1  . aspirin 81 MG chewable tablet Take 81 mg by mouth once daily    . cyanocobalamin, vitamin B-12, (VITAMIN B-12 ORAL) Take by mouth    . cyclobenzaprine (FLEXERIL) 5 MG tablet Take by mouth    . hydrOXYzine (VISTARIL) 50 MG capsule Take 50 mg by mouth 3 (three) times daily as needed for  Anxiety    . meloxicam (MOBIC) 15 MG tablet Take 1 tablet by mouth once daily    . metFORMIN (GLUCOPHAGE) 500 MG tablet Take by mouth 2 (two) times daily with meals       . metoprolol tartrate (LOPRESSOR) 25 MG tablet 12.5 mg 2 (two) times daily       . naproxen (NAPROSYN) 500 MG tablet Take 500 mg by mouth 2 (two) times daily as needed    . omeprazole (PRILOSEC) 20 MG DR capsule Take 20 mg by mouth once daily    . pravastatin (PRAVACHOL) 40 MG tablet Take 40 mg by mouth nightly    . PROAIR HFA 90 mcg/actuation inhaler as needed    . VITAMIN D3 ORAL Take by mouth    . [DISCONTINUED] hydrOXYzine (ATARAX) 50 MG tablet Take 50 mg by mouth 3 (three) times daily as needed (Patient not taking: Reported on 05/29/2020  )    . [DISCONTINUED] meloxicam (MOBIC) 7.5 MG tablet Take 7.5 mg by mouth once daily    . [DISCONTINUED] methylPREDNISolone (MEDROL DOSEPACK) 4 mg tablet Start Medrol dose pack to break headache cycle. Day 1 take 6 pills Day 2 take 5 pills Day 3 take 4 pills Day 4 take 3 pills  Day 5 take 2 pills Day 6 take 1 pill Done. (Patient not taking: Reported on 05/29/2020  ) 21 tablet 0            Facility-Administered Encounter Medications as of 05/29/2020  Medication Dose Route Frequency Provider Last Rate Last Admin  . lidocaine-EPINEPHrine (PF) 1.5 %-1:200,000 injection    PRN Orest Dikes, MD   45 mg at 07/16/17 0725       ALLERGIES:   Patient has no known allergies.   SOCIAL HISTORY:  Social History          Socioeconomic History  . Marital status: Single    Spouse name: Not on file  . Number of children: Not on file  . Years of education: Not on file  . Highest education level: Not on file  Occupational History  . Not on file  Tobacco Use  . Smoking status: Current Every Day Smoker    Packs/day: 1.00    Types: Cigarettes  . Smokeless tobacco: Never Used  Vaping Use  . Vaping Use: Never used  Substance and Sexual Activity  . Alcohol  use: No  . Drug use: Yes    Types: Marijuana    Comment: once in a while  . Sexual activity: Defer  Other Topics Concern  . Not on file  Social History Narrative  . Not on file   Social Determinants of Health      Financial Resource Strain:   . Difficulty of Paying Living Expenses:   Food Insecurity:   . Worried About Charity fundraiser in the Last Year:   . Arboriculturist in the Last Year:   Transportation Needs:   . Film/video editor (Medical):   Marland Kitchen Lack of Transportation (Non-Medical):       FAMILY HISTORY:       Family History  Problem Relation Age of Onset  . Deep vein thrombosis (DVT or abnormal blood clot formation) Mother   . Diabetes Mother   . Coronary Artery Disease (Blocked arteries around heart) Father   . Cancer Father   . Cancer Brother      GENERAL REVIEW OF SYSTEMS:   General ROS: negative for - chills, fatigue, fever, weight gain or weight loss Allergy and Immunology ROS: negative for - hives  Hematological and Lymphatic ROS: negative for - bleeding problems or bruising, negative for palpable nodes Endocrine ROS: negative for - heat or cold intolerance, hair changes Respiratory ROS: negative for - cough, shortness of breath or wheezing Cardiovascular ROS: no chest pain or palpitations GI ROS: negative for nausea, vomiting, diarrhea, constipation.  Positive for the middle pain Musculoskeletal ROS: negative for - joint swelling or muscle pain Neurological ROS: negative for - confusion, syncope Dermatological ROS: negative for pruritus and rash  PHYSICAL EXAM:     Vitals:   05/29/20 0917  BP: 120/72  Pulse: 109  .  Ht:160 cm (5\' 3" ) Wt:99.8 kg (220 lb) AOZ:HYQM surface area is 2.11 meters squared. Body mass index is 38.97 kg/m.Marland Kitchen   GENERAL: Alert, active, oriented x3  HEENT: Pupils equal reactive to light. Extraocular movements are intact. Sclera clear. Palpebral conjunctiva normal red color.Pharynx clear.  NECK:  Supple with no palpable mass and no adenopathy.  LUNGS: Sound clear with no rales rhonchi or wheezes.  HEART: Regular rhythm S1 and S2 without murmur.  ABDOMEN: Soft and depressible, nontender with no palpable mass, no hepatomegaly.  Small reducible epigastric incisional hernia.  EXTREMITIES: Well-developed well-nourished symmetrical  with no dependent edema.  NEUROLOGICAL: Awake alert oriented, facial expression symmetrical, moving all extremities.      IMPRESSION:     Incisional hernia, without obstruction or gangrene [K43.2]   Patient with the midline small incisional hernia.  Due to her obesity I do recommend to proceed with robotic assisted laparoscopic incisional hernia repair.  I discussed with her the need of general anesthesia.  I discussed with her the risk of surgery that include injury to bowel, bleeding, infection, bowel obstruction, pain, scars, among others.  She understood and agreed to proceed.        PLAN:  1. Robotic assisted laparoscopic incisional hernia repair with mesh (41962)  2. CBC, CMP 3.  Do not take aspirin 5 days before surgery 4. Contact us if has any question or concern.   Patient verbalized understanding, all questions were answered, and were agreeable with the plan outlined above.   Herbert Pun, MD  Electronically signed by Herbert Pun, MD

## 2020-05-29 NOTE — H&P (Signed)
HISTORY OF PRESENT ILLNESS:    Sally Hughes is a 59 y.o.female patient who comes for follow up and discussion of CT scan.  Patient with suspected incisional hernia in the midline abdomen.  Due to having a large left subcostal incision CT scan was ordered for complete evaluation of complex incisional hernia.  The patient reported she continued having pain in the midline.  She reports having a bulging that comes in and out daily.  The pain does not radiate to other part of the body.  There is no alleviating or aggravating factor at this moment.  Patient denies any abdominal distention nausea or vomiting.  Patient tolerating regular diet.  Impression elevated the CT scan of the abdominal pelvis.  I showed the patient the hernia that is 2 cm on the epigastric area.  There is no sign of hernia on the left subcostal area.  There is no sign of bowel obstruction.      PAST MEDICAL HISTORY:      Past Medical History:  Diagnosis Date  . Anxiety   . Anxiety   . COPD (chronic obstructive pulmonary disease) (CMS-HCC)   . Depression (emotion)   . Diabetes mellitus without complication (CMS-HCC)   . Hypertension   . Low back pain   . Migraine headache         PAST SURGICAL HISTORY:        Past Surgical History:  Procedure Laterality Date  . EXPLORATION KIDNEY    . EYELID SURGERY           MEDICATIONS:  Encounter Medications        Outpatient Encounter Medications as of 05/29/2020  Medication Sig Dispense Refill  . acetaminophen (TYLENOL) 500 MG tablet Take by mouth every 6 (six) hours    . amitriptyline (ELAVIL) 75 MG tablet Take 1 tablet (75 mg total) by mouth nightly 90 tablet 1  . aspirin 81 MG chewable tablet Take 81 mg by mouth once daily    . cyanocobalamin, vitamin B-12, (VITAMIN B-12 ORAL) Take by mouth    . cyclobenzaprine (FLEXERIL) 5 MG tablet Take by mouth    . hydrOXYzine (VISTARIL) 50 MG capsule Take 50 mg by mouth 3 (three) times daily as needed for  Anxiety    . meloxicam (MOBIC) 15 MG tablet Take 1 tablet by mouth once daily    . metFORMIN (GLUCOPHAGE) 500 MG tablet Take by mouth 2 (two) times daily with meals       . metoprolol tartrate (LOPRESSOR) 25 MG tablet 12.5 mg 2 (two) times daily       . naproxen (NAPROSYN) 500 MG tablet Take 500 mg by mouth 2 (two) times daily as needed    . omeprazole (PRILOSEC) 20 MG DR capsule Take 20 mg by mouth once daily    . pravastatin (PRAVACHOL) 40 MG tablet Take 40 mg by mouth nightly    . PROAIR HFA 90 mcg/actuation inhaler as needed    . VITAMIN D3 ORAL Take by mouth    . [DISCONTINUED] hydrOXYzine (ATARAX) 50 MG tablet Take 50 mg by mouth 3 (three) times daily as needed (Patient not taking: Reported on 05/29/2020  )    . [DISCONTINUED] meloxicam (MOBIC) 7.5 MG tablet Take 7.5 mg by mouth once daily    . [DISCONTINUED] methylPREDNISolone (MEDROL DOSEPACK) 4 mg tablet Start Medrol dose pack to break headache cycle. Day 1 take 6 pills Day 2 take 5 pills Day 3 take 4 pills Day 4 take 3 pills  Day 5 take 2 pills Day 6 take 1 pill Done. (Patient not taking: Reported on 05/29/2020  ) 21 tablet 0            Facility-Administered Encounter Medications as of 05/29/2020  Medication Dose Route Frequency Provider Last Rate Last Admin  . lidocaine-EPINEPHrine (PF) 1.5 %-1:200,000 injection    PRN Orest Dikes, MD   45 mg at 07/16/17 0725       ALLERGIES:   Patient has no known allergies.   SOCIAL HISTORY:  Social History          Socioeconomic History  . Marital status: Single    Spouse name: Not on file  . Number of children: Not on file  . Years of education: Not on file  . Highest education level: Not on file  Occupational History  . Not on file  Tobacco Use  . Smoking status: Current Every Day Smoker    Packs/day: 1.00    Types: Cigarettes  . Smokeless tobacco: Never Used  Vaping Use  . Vaping Use: Never used  Substance and Sexual Activity  . Alcohol  use: No  . Drug use: Yes    Types: Marijuana    Comment: once in a while  . Sexual activity: Defer  Other Topics Concern  . Not on file  Social History Narrative  . Not on file   Social Determinants of Health      Financial Resource Strain:   . Difficulty of Paying Living Expenses:   Food Insecurity:   . Worried About Charity fundraiser in the Last Year:   . Arboriculturist in the Last Year:   Transportation Needs:   . Film/video editor (Medical):   Marland Kitchen Lack of Transportation (Non-Medical):       FAMILY HISTORY:       Family History  Problem Relation Age of Onset  . Deep vein thrombosis (DVT or abnormal blood clot formation) Mother   . Diabetes Mother   . Coronary Artery Disease (Blocked arteries around heart) Father   . Cancer Father   . Cancer Brother      GENERAL REVIEW OF SYSTEMS:   General ROS: negative for - chills, fatigue, fever, weight gain or weight loss Allergy and Immunology ROS: negative for - hives  Hematological and Lymphatic ROS: negative for - bleeding problems or bruising, negative for palpable nodes Endocrine ROS: negative for - heat or cold intolerance, hair changes Respiratory ROS: negative for - cough, shortness of breath or wheezing Cardiovascular ROS: no chest pain or palpitations GI ROS: negative for nausea, vomiting, diarrhea, constipation.  Positive for the middle pain Musculoskeletal ROS: negative for - joint swelling or muscle pain Neurological ROS: negative for - confusion, syncope Dermatological ROS: negative for pruritus and rash  PHYSICAL EXAM:     Vitals:   05/29/20 0917  BP: 120/72  Pulse: 109  .  Ht:160 cm (5\' 3" ) Wt:99.8 kg (220 lb) GMW:NUUV surface area is 2.11 meters squared. Body mass index is 38.97 kg/m.Marland Kitchen   GENERAL: Alert, active, oriented x3  HEENT: Pupils equal reactive to light. Extraocular movements are intact. Sclera clear. Palpebral conjunctiva normal red color.Pharynx clear.  NECK:  Supple with no palpable mass and no adenopathy.  LUNGS: Sound clear with no rales rhonchi or wheezes.  HEART: Regular rhythm S1 and S2 without murmur.  ABDOMEN: Soft and depressible, nontender with no palpable mass, no hepatomegaly.  Small reducible epigastric incisional hernia.  EXTREMITIES: Well-developed well-nourished symmetrical  with no dependent edema.  NEUROLOGICAL: Awake alert oriented, facial expression symmetrical, moving all extremities.      IMPRESSION:     Incisional hernia, without obstruction or gangrene [K43.2]   Patient with the midline small incisional hernia.  Due to her obesity I do recommend to proceed with robotic assisted laparoscopic incisional hernia repair.  I discussed with her the need of general anesthesia.  I discussed with her the risk of surgery that include injury to bowel, bleeding, infection, bowel obstruction, pain, scars, among others.  She understood and agreed to proceed.        PLAN:  1. Robotic assisted laparoscopic incisional hernia repair with mesh (45364)  2. CBC, CMP 3.  Do not take aspirin 5 days before surgery 4. Contact us if has any question or concern.   Patient verbalized understanding, all questions were answered, and were agreeable with the plan outlined above.   Herbert Pun, MD  Electronically signed by Herbert Pun, MD

## 2020-05-30 ENCOUNTER — Other Ambulatory Visit: Payer: Self-pay | Admitting: Physician Assistant

## 2020-05-30 DIAGNOSIS — Z1231 Encounter for screening mammogram for malignant neoplasm of breast: Secondary | ICD-10-CM

## 2020-06-01 DIAGNOSIS — R519 Headache, unspecified: Secondary | ICD-10-CM | POA: Insufficient documentation

## 2020-06-07 ENCOUNTER — Encounter
Admission: RE | Admit: 2020-06-07 | Discharge: 2020-06-07 | Disposition: A | Discharge status: Home / Self Care | Payer: Medicare HMO | Source: Ambulatory Visit | Attending: General Surgery | Admitting: General Surgery

## 2020-06-07 ENCOUNTER — Other Ambulatory Visit: Payer: Self-pay

## 2020-06-07 HISTORY — DX: Essential (primary) hypertension: I10

## 2020-06-07 NOTE — Patient Instructions (Signed)
COVID TESTING Date:June 11, 2020 MONDAY Testing site:  [facility] [address] Thru Hours:  0:16 am - 1:00 pm Once you are tested, you are asked to stay quarantined (avoiding public places) until after your surgery.   Your procedure is scheduled on: Thursday June 13, 2020 Report to Day Surgery on the 2nd floor of the [facility]. To find out your arrival time, please call [phone] between 1PM - 3PM on: June 12, 2020 [facility]  REMEMBER: Instructions that are not followed completely may result in serious medical risk, up to and including death; or upon the discretion of your surgeon and anesthesiologist your surgery may need to be rescheduled.  Do not eat food after midnight the night before surgery.  No gum chewing, lozengers or hard candies.  You may however, drink CLEAR liquids up to 2 hours before you are scheduled to arrive for your surgery. Do not drink anything within 2 hours of your scheduled arrival time.  Clear liquids include: - water   Do NOT drink anything that is not on this list.  Type 1 and Type 2 diabetics should only drink water.  TAKE THESE MEDICATIONS THE MORNING OF SURGERY WITH A SIP OF WATER: METOPROLOL BUSPIRONE OMEPRAZOLE (take one the night before and one on the morning of surgery - helps to prevent nausea after surgery.)  Use inhalers on the day of surgery  Stop Metformin  2 days prior to surgery. LAST DOSE Monday June 11, 2020  STOP ASPIRIN 5 DAYS BEFORE SURGERY.  Stop Anti-inflammatories (NSAIDS) such as Advil, Aleve, Ibuprofen, Motrin, Naproxen, Naprosyn and Aspirin based products such as Excedrin, Goodys Powder, BC Powder. (May take Tylenol or Acetaminophen if needed.)  Stop ANY OVER THE COUNTER supplements until after surgery. (May continue Vitamin D, Vitamin B, and multivitamin.)  No Alcohol for 24 hours before or after surgery.  No Smoking including e-cigarettes for 24 hours prior to surgery.  No  chewable tobacco products for at least 6 hours prior to surgery.  No nicotine patches on the day of surgery.  Do not use any "recreational" drugs for at least a week prior to your surgery.  Please be advised that the combination of cocaine and anesthesia may have negative outcomes, up to and including death. If you test positive for cocaine, your surgery will be cancelled.  On the morning of surgery brush your teeth with toothpaste and water, you may rinse your mouth with mouthwash if you wish. Do not swallow any toothpaste or mouthwash.  Do not wear jewelry, make-up, hairpins, clips or nail polish.  Do not wear lotions, powders, or perfumes DEODORANT   Do not shave 48 hours prior to surgery.   Contact lenses, hearing aids and dentures may not be worn into surgery.  Do not bring valuables to the hospital. [facility] is not responsible for any missing/lost belongings or valuables.   Use CHG Soap  as directed on instruction sheet.  Notify your doctor if there is any change in your medical condition (cold, fever, infection).  Wear comfortable clothing (specific to your surgery type) to the hospital.  Plan for stool softeners for home use; pain medications have a tendency to cause constipation. You can also help prevent constipation by eating foods high in fiber such as fruits and vegetables and drinking plenty of fluids as your diet allows.  After surgery, you can help prevent lung complications by doing breathing exercises.  Take deep breaths and cough every 1-2 hours. Your  doctor may order a device called an Incentive Spirometer to help you take deep breaths. When coughing or sneezing, hold a pillow firmly against your incision with both hands. This is called "splinting." Doing this helps protect your incision. It also decreases belly discomfort.  If you are being discharged the day of surgery, you will not be allowed to drive home. You will need a responsible adult (18 years or  older) to drive you home and stay with you that night.   Please call the [facility] Dept. at [phone] if you have any questions about these instructions.  Visitation Policy:  Patients undergoing a surgery or procedure may have one family member or support person with them as long as that person is not COVID-19 positive or experiencing its symptoms.  That person may remain in the waiting area during the procedure.  Children under 30 years of age may have both parents or legal guardians with them during their procedure.  Inpatient Visitation Update:   Two designated support people may visit a patient during visiting hours 7 am to 8 pm. It must be the same two designated people for the duration of the patient stay. The visitors may come and go during the day, and there is no switching out to have different visitors. A mask must be worn at all times, including in the patient room.  Children under 64 years of age:  a total of 4 designated visitors for the child's entire stay are allowed. Only 2 in the room at a time and only one staying overnight at a time. The overnight guest can now rotate during the child's hospital stay.  As a reminder, masks are still required for all [facility] team members, patients and visitors in all [facility] facilities.   Systemwide, no visitors 17 or younger.

## 2020-06-11 ENCOUNTER — Other Ambulatory Visit
Admission: RE | Admit: 2020-06-11 | Discharge: 2020-06-11 | Disposition: A | Discharge status: Home / Self Care | Payer: Medicare Other | Source: Ambulatory Visit | Attending: General Surgery | Admitting: General Surgery

## 2020-06-11 ENCOUNTER — Other Ambulatory Visit: Payer: Self-pay

## 2020-06-11 DIAGNOSIS — I10 Essential (primary) hypertension: Secondary | ICD-10-CM | POA: Diagnosis not present

## 2020-06-11 DIAGNOSIS — Z01818 Encounter for other preprocedural examination: Secondary | ICD-10-CM | POA: Diagnosis not present

## 2020-06-11 DIAGNOSIS — Z20822 Contact with and (suspected) exposure to covid-19: Secondary | ICD-10-CM | POA: Diagnosis not present

## 2020-06-11 LAB — SARS CORONAVIRUS 2 (TAT 6-24 HRS): SARS Coronavirus 2: NEGATIVE

## 2020-06-13 ENCOUNTER — Ambulatory Visit: Payer: Medicare Other

## 2020-06-13 ENCOUNTER — Ambulatory Visit
Admission: RE | Admit: 2020-06-13 | Discharge: 2020-06-13 | Disposition: A | Discharge status: Home / Self Care | Payer: Medicare Other | Attending: General Surgery | Admitting: General Surgery

## 2020-06-13 ENCOUNTER — Encounter
Admission: RE | Disposition: A | Discharge status: Home / Self Care | Payer: Self-pay | Source: Home / Self Care | Attending: General Surgery

## 2020-06-13 DIAGNOSIS — Z79899 Other long term (current) drug therapy: Secondary | ICD-10-CM | POA: Diagnosis not present

## 2020-06-13 DIAGNOSIS — E669 Obesity, unspecified: Secondary | ICD-10-CM | POA: Insufficient documentation

## 2020-06-13 DIAGNOSIS — I10 Essential (primary) hypertension: Secondary | ICD-10-CM | POA: Diagnosis not present

## 2020-06-13 DIAGNOSIS — J449 Chronic obstructive pulmonary disease, unspecified: Secondary | ICD-10-CM | POA: Diagnosis not present

## 2020-06-13 DIAGNOSIS — F419 Anxiety disorder, unspecified: Secondary | ICD-10-CM | POA: Insufficient documentation

## 2020-06-13 DIAGNOSIS — Z6838 Body mass index (BMI) 38.0-38.9, adult: Secondary | ICD-10-CM | POA: Diagnosis not present

## 2020-06-13 DIAGNOSIS — Z8249 Family history of ischemic heart disease and other diseases of the circulatory system: Secondary | ICD-10-CM | POA: Diagnosis not present

## 2020-06-13 DIAGNOSIS — Z7982 Long term (current) use of aspirin: Secondary | ICD-10-CM | POA: Insufficient documentation

## 2020-06-13 DIAGNOSIS — Z7984 Long term (current) use of oral hypoglycemic drugs: Secondary | ICD-10-CM | POA: Insufficient documentation

## 2020-06-13 DIAGNOSIS — Z8619 Personal history of other infectious and parasitic diseases: Secondary | ICD-10-CM | POA: Diagnosis not present

## 2020-06-13 DIAGNOSIS — E119 Type 2 diabetes mellitus without complications: Secondary | ICD-10-CM | POA: Insufficient documentation

## 2020-06-13 DIAGNOSIS — F17210 Nicotine dependence, cigarettes, uncomplicated: Secondary | ICD-10-CM | POA: Insufficient documentation

## 2020-06-13 DIAGNOSIS — K432 Incisional hernia without obstruction or gangrene: Secondary | ICD-10-CM | POA: Insufficient documentation

## 2020-06-13 DIAGNOSIS — Z833 Family history of diabetes mellitus: Secondary | ICD-10-CM | POA: Insufficient documentation

## 2020-06-13 DIAGNOSIS — Z791 Long term (current) use of non-steroidal anti-inflammatories (NSAID): Secondary | ICD-10-CM | POA: Diagnosis not present

## 2020-06-13 HISTORY — PX: XI ROBOTIC ASSISTED VENTRAL HERNIA: SHX6789

## 2020-06-13 LAB — URINE DRUG SCREEN, QUALITATIVE (ARMC ONLY)
Amphetamines, Ur Screen: NOT DETECTED
Barbiturates, Ur Screen: NOT DETECTED
Benzodiazepine, Ur Scrn: NOT DETECTED
Cannabinoid 50 Ng, Ur ~~LOC~~: NOT DETECTED
Cocaine Metabolite,Ur ~~LOC~~: NOT DETECTED
MDMA (Ecstasy)Ur Screen: NOT DETECTED
Methadone Scn, Ur: NOT DETECTED
Opiate, Ur Screen: POSITIVE — AB
Phencyclidine (PCP) Ur S: NOT DETECTED
Tricyclic, Ur Screen: POSITIVE — AB

## 2020-06-13 LAB — GLUCOSE, CAPILLARY
Glucose-Capillary: 166 mg/dL — ABNORMAL HIGH (ref 70–99)
Glucose-Capillary: 173 mg/dL — ABNORMAL HIGH (ref 70–99)

## 2020-06-13 SURGERY — REPAIR, HERNIA, VENTRAL, ROBOT-ASSISTED
Anesthesia: General

## 2020-06-13 MED ORDER — MIDAZOLAM HCL 2 MG/2ML IJ SOLN
INTRAMUSCULAR | Status: DC | PRN
  Administered 2020-06-13: 2 mg via INTRAVENOUS

## 2020-06-13 MED ORDER — ONDANSETRON HCL 4 MG/2ML IJ SOLN: 4.0000 mg | Freq: Once | INTRAMUSCULAR | Status: DC | PRN

## 2020-06-13 MED ORDER — PROPOFOL 10 MG/ML IV BOLUS
INTRAVENOUS | Status: AC
  Filled 2020-06-13: qty 20

## 2020-06-13 MED ORDER — ACETAMINOPHEN 10 MG/ML IV SOLN
INTRAVENOUS | Status: AC
  Filled 2020-06-13: qty 100

## 2020-06-13 MED ORDER — BUPIVACAINE LIPOSOME 1.3 % IJ SUSP
INTRAMUSCULAR | Status: DC | PRN
  Administered 2020-06-13: 20 mL

## 2020-06-13 MED ORDER — BUPIVACAINE LIPOSOME 1.3 % IJ SUSP
INTRAMUSCULAR | Status: AC
  Filled 2020-06-13: qty 20

## 2020-06-13 MED ORDER — ORAL CARE MOUTH RINSE: 15.0000 mL | Freq: Once | OROMUCOSAL | Status: AC

## 2020-06-13 MED ORDER — ROCURONIUM BROMIDE 100 MG/10ML IV SOLN
INTRAVENOUS | Status: DC | PRN
  Administered 2020-06-13: 30 mg via INTRAVENOUS
  Administered 2020-06-13: 20 mg via INTRAVENOUS

## 2020-06-13 MED ORDER — HYDROCODONE-ACETAMINOPHEN 5-325 MG PO TABS: 1.0000 | ORAL_TABLET | ORAL | 0 refills | Status: AC | PRN

## 2020-06-13 MED ORDER — HYDROCODONE-ACETAMINOPHEN 5-325 MG PO TABS
ORAL_TABLET | ORAL | Status: AC
  Filled 2020-06-13: qty 1

## 2020-06-13 MED ORDER — ACETAMINOPHEN 10 MG/ML IV SOLN
INTRAVENOUS | Status: DC | PRN
  Administered 2020-06-13: 1000 mg via INTRAVENOUS

## 2020-06-13 MED ORDER — OXYCODONE HCL 5 MG/5ML PO SOLN: 5.0000 mg | Freq: Once | ORAL | Status: AC | PRN

## 2020-06-13 MED ORDER — HYDROCODONE-ACETAMINOPHEN 5-325 MG PO TABS
1.0000 | ORAL_TABLET | Freq: Once | ORAL | Status: AC
  Administered 2020-06-13: 1 via ORAL

## 2020-06-13 MED ORDER — SODIUM CHLORIDE 0.9 % IV SOLN
INTRAVENOUS | Status: DC | PRN
  Administered 2020-06-13: 40 ug/min via INTRAVENOUS

## 2020-06-13 MED ORDER — PROPOFOL 10 MG/ML IV BOLUS
INTRAVENOUS | Status: DC | PRN
  Administered 2020-06-13: 150 mg via INTRAVENOUS

## 2020-06-13 MED ORDER — CHLORHEXIDINE GLUCONATE 0.12 % MT SOLN
15.0000 mL | Freq: Once | OROMUCOSAL | Status: AC
  Administered 2020-06-13: 15 mL via OROMUCOSAL

## 2020-06-13 MED ORDER — ROCURONIUM BROMIDE 10 MG/ML (PF) SYRINGE
PREFILLED_SYRINGE | INTRAVENOUS | Status: AC
  Filled 2020-06-13: qty 10

## 2020-06-13 MED ORDER — ONDANSETRON HCL 4 MG/2ML IJ SOLN
INTRAMUSCULAR | Status: DC | PRN
  Administered 2020-06-13: 4 mg via INTRAVENOUS

## 2020-06-13 MED ORDER — SUGAMMADEX SODIUM 200 MG/2ML IV SOLN
INTRAVENOUS | Status: DC | PRN
  Administered 2020-06-13: 200 mg via INTRAVENOUS

## 2020-06-13 MED ORDER — DEXMEDETOMIDINE HCL IN NACL 80 MCG/20ML IV SOLN
INTRAVENOUS | Status: AC
  Filled 2020-06-13: qty 20

## 2020-06-13 MED ORDER — SEVOFLURANE IN SOLN
RESPIRATORY_TRACT | Status: AC
  Filled 2020-06-13: qty 250

## 2020-06-13 MED ORDER — OXYCODONE HCL 5 MG PO TABS
ORAL_TABLET | ORAL | Status: AC
  Administered 2020-06-13: 5 mg via ORAL
  Filled 2020-06-13: qty 1

## 2020-06-13 MED ORDER — EPHEDRINE 5 MG/ML INJ
INTRAVENOUS | Status: AC
  Filled 2020-06-13: qty 10

## 2020-06-13 MED ORDER — FENTANYL CITRATE (PF) 100 MCG/2ML IJ SOLN
INTRAMUSCULAR | Status: AC
  Administered 2020-06-13: 50 ug via INTRAVENOUS
  Filled 2020-06-13: qty 2

## 2020-06-13 MED ORDER — GLYCOPYRROLATE 0.2 MG/ML IJ SOLN
INTRAMUSCULAR | Status: AC
  Filled 2020-06-13: qty 1

## 2020-06-13 MED ORDER — FENTANYL CITRATE (PF) 100 MCG/2ML IJ SOLN
25.0000 ug | INTRAMUSCULAR | Status: DC | PRN
  Administered 2020-06-13: 50 ug via INTRAVENOUS

## 2020-06-13 MED ORDER — SUCCINYLCHOLINE CHLORIDE 20 MG/ML IJ SOLN
INTRAMUSCULAR | Status: DC | PRN
  Administered 2020-06-13: 120 mg via INTRAVENOUS

## 2020-06-13 MED ORDER — BUPIVACAINE-EPINEPHRINE (PF) 0.25% -1:200000 IJ SOLN
INTRAMUSCULAR | Status: DC | PRN
  Administered 2020-06-13: 30 mL

## 2020-06-13 MED ORDER — CEFAZOLIN SODIUM-DEXTROSE 2-4 GM/100ML-% IV SOLN
2.0000 g | INTRAVENOUS | Status: AC
  Administered 2020-06-13: 2 g via INTRAVENOUS

## 2020-06-13 MED ORDER — OXYCODONE HCL 5 MG PO TABS: 5.0000 mg | ORAL_TABLET | Freq: Once | ORAL | Status: AC | PRN

## 2020-06-13 MED ORDER — ONDANSETRON HCL 4 MG/2ML IJ SOLN
INTRAMUSCULAR | Status: AC
  Filled 2020-06-13: qty 2

## 2020-06-13 MED ORDER — PHENYLEPHRINE HCL (PRESSORS) 10 MG/ML IV SOLN
INTRAVENOUS | Status: DC | PRN
  Administered 2020-06-13: 100 ug via INTRAVENOUS
  Administered 2020-06-13 (×2): 200 ug via INTRAVENOUS
  Administered 2020-06-13 (×3): 100 ug via INTRAVENOUS

## 2020-06-13 MED ORDER — FENTANYL CITRATE (PF) 100 MCG/2ML IJ SOLN
INTRAMUSCULAR | Status: DC | PRN
  Administered 2020-06-13 (×2): 25 ug via INTRAVENOUS
  Administered 2020-06-13: 50 ug via INTRAVENOUS

## 2020-06-13 MED ORDER — LIDOCAINE HCL (CARDIAC) PF 100 MG/5ML IV SOSY
PREFILLED_SYRINGE | INTRAVENOUS | Status: DC | PRN
  Administered 2020-06-13: 80 mg via INTRAVENOUS

## 2020-06-13 MED ORDER — BUPIVACAINE-EPINEPHRINE (PF) 0.25% -1:200000 IJ SOLN
INTRAMUSCULAR | Status: AC
  Filled 2020-06-13: qty 30

## 2020-06-13 MED ORDER — SUCCINYLCHOLINE CHLORIDE 200 MG/10ML IV SOSY
PREFILLED_SYRINGE | INTRAVENOUS | Status: AC
  Filled 2020-06-13: qty 10

## 2020-06-13 MED ORDER — SODIUM CHLORIDE 0.9 % IV SOLN: INTRAVENOUS | Status: DC

## 2020-06-13 MED ORDER — ACETAMINOPHEN 10 MG/ML IV SOLN: 1000.0000 mg | Freq: Once | INTRAVENOUS | Status: DC | PRN

## 2020-06-13 MED ORDER — DEXAMETHASONE SODIUM PHOSPHATE 10 MG/ML IJ SOLN
INTRAMUSCULAR | Status: AC
  Filled 2020-06-13: qty 1

## 2020-06-13 MED ORDER — LACTATED RINGERS IV SOLN: INTRAVENOUS | Status: DC | PRN

## 2020-06-13 MED ORDER — LIDOCAINE HCL (PF) 2 % IJ SOLN
INTRAMUSCULAR | Status: AC
  Filled 2020-06-13: qty 5

## 2020-06-13 MED ORDER — CEFAZOLIN SODIUM-DEXTROSE 2-4 GM/100ML-% IV SOLN
INTRAVENOUS | Status: AC
  Filled 2020-06-13: qty 100

## 2020-06-13 MED ORDER — FENTANYL CITRATE (PF) 100 MCG/2ML IJ SOLN
INTRAMUSCULAR | Status: AC
  Filled 2020-06-13: qty 2

## 2020-06-13 MED ORDER — DEXAMETHASONE SODIUM PHOSPHATE 10 MG/ML IJ SOLN
INTRAMUSCULAR | Status: DC | PRN
  Administered 2020-06-13: 10 mg via INTRAVENOUS

## 2020-06-13 MED ORDER — FENTANYL CITRATE (PF) 100 MCG/2ML IJ SOLN
25.0000 ug | INTRAMUSCULAR | Status: AC | PRN
  Administered 2020-06-13 (×2): 25 ug via INTRAVENOUS

## 2020-06-13 MED ORDER — EPHEDRINE SULFATE 50 MG/ML IJ SOLN
INTRAMUSCULAR | Status: DC | PRN
  Administered 2020-06-13: 5 mg via INTRAVENOUS
  Administered 2020-06-13 (×3): 10 mg via INTRAVENOUS

## 2020-06-13 MED ORDER — CHLORHEXIDINE GLUCONATE 0.12 % MT SOLN
OROMUCOSAL | Status: AC
  Filled 2020-06-13: qty 15

## 2020-06-13 MED ORDER — MIDAZOLAM HCL 2 MG/2ML IJ SOLN
INTRAMUSCULAR | Status: AC
  Filled 2020-06-13: qty 2

## 2020-06-13 SURGICAL SUPPLY — 45 items
ADH SKN CLS APL DERMABOND .7 (GAUZE/BANDAGES/DRESSINGS) ×1
APL PRP STRL LF DISP 70% ISPRP (MISCELLANEOUS) ×1
BLADE SURG SZ11 CARB STEEL (BLADE) ×2 IMPLANT
CANISTER SUCT 1200ML W/VALVE (MISCELLANEOUS) ×2 IMPLANT
CHLORAPREP W/TINT 26 (MISCELLANEOUS) ×2 IMPLANT
COVER TIP SHEARS 8 DVNC (MISCELLANEOUS) ×1 IMPLANT
COVER TIP SHEARS 8MM DA VINCI (MISCELLANEOUS) ×1
COVER WAND RF STERILE (DRAPES) ×4 IMPLANT
DEFOGGER SCOPE WARMER CLEARIFY (MISCELLANEOUS) ×2 IMPLANT
DERMABOND ADVANCED (GAUZE/BANDAGES/DRESSINGS) ×1
DERMABOND ADVANCED .7 DNX12 (GAUZE/BANDAGES/DRESSINGS) ×1 IMPLANT
DRAPE ARM DVNC X/XI (DISPOSABLE) ×3 IMPLANT
DRAPE COLUMN DVNC XI (DISPOSABLE) ×1 IMPLANT
DRAPE DA VINCI XI ARM (DISPOSABLE) ×3
DRAPE DA VINCI XI COLUMN (DISPOSABLE) ×1
ELECT REM PT RETURN 9FT ADLT (ELECTROSURGICAL) ×2
ELECTRODE REM PT RTRN 9FT ADLT (ELECTROSURGICAL) ×1 IMPLANT
GLOVE BIO SURGEON STRL SZ 6.5 (GLOVE) ×4 IMPLANT
GLOVE BIOGEL PI IND STRL 6.5 (GLOVE) ×2 IMPLANT
GLOVE BIOGEL PI INDICATOR 6.5 (GLOVE) ×2
GOWN STRL REUS W/ TWL LRG LVL3 (GOWN DISPOSABLE) ×3 IMPLANT
GOWN STRL REUS W/TWL LRG LVL3 (GOWN DISPOSABLE) ×6
IRRIGATOR SUCT 8 DISP DVNC XI (IRRIGATION / IRRIGATOR) IMPLANT
IRRIGATOR SUCTION 8MM XI DISP (IRRIGATION / IRRIGATOR)
IV NS 1000ML (IV SOLUTION)
IV NS 1000ML BAXH (IV SOLUTION) IMPLANT
KIT PINK PAD W/HEAD ARE REST (MISCELLANEOUS) ×2
KIT PINK PAD W/HEAD ARM REST (MISCELLANEOUS) ×1 IMPLANT
LABEL OR SOLS (LABEL) ×2 IMPLANT
MESH VENT LT ST 11.4CM CRL (Mesh General) ×2 IMPLANT
NEEDLE HYPO 22GX1.5 SAFETY (NEEDLE) ×2 IMPLANT
NEEDLE INSUFFLATION 14GA 120MM (NEEDLE) ×2 IMPLANT
OBTURATOR OPTICAL STANDARD 8MM (TROCAR) ×1
OBTURATOR OPTICAL STND 8 DVNC (TROCAR) ×1
OBTURATOR OPTICALSTD 8 DVNC (TROCAR) ×1 IMPLANT
PACK LAP CHOLECYSTECTOMY (MISCELLANEOUS) ×2 IMPLANT
SEAL CANN UNIV 5-8 DVNC XI (MISCELLANEOUS) ×3 IMPLANT
SEAL XI 5MM-8MM UNIVERSAL (MISCELLANEOUS) ×3
SET TUBE SMOKE EVAC HIGH FLOW (TUBING) ×2 IMPLANT
SOLUTION ELECTROLUBE (MISCELLANEOUS) ×2 IMPLANT
SUT MNCRL AB 4-0 PS2 18 (SUTURE) ×4 IMPLANT
SUT STRATAFIX PDS 30 CT-1 (SUTURE) ×2 IMPLANT
SUT VLOC 90 2/L VL 12 GS22 (SUTURE) ×4 IMPLANT
TAPE TRANSPORE STRL 2 31045 (GAUZE/BANDAGES/DRESSINGS) IMPLANT
TRAY FOLEY MTR SLVR 16FR STAT (SET/KITS/TRAYS/PACK) ×2 IMPLANT

## 2020-06-13 NOTE — Transfer of Care (Signed)
Immediate Anesthesia Transfer of Care Note  Patient: [name]  Procedure(s) Performed: XI ROBOTIC ASSISTED INCISIONAL HERNIA (N/A )  Patient Location: PACU  Anesthesia Type:General  Level of Consciousness: awake and patient cooperative  Airway & Oxygen Therapy: Patient Spontanous Breathing and Patient connected to face mask oxygen  Post-op Assessment: Report given to RN and Post -op Vital signs reviewed and stable  Post vital signs: Reviewed and stable  Last Vitals:  Vitals Value Taken Time  BP 96/55 06/13/20 1032  Temp    Pulse 80 06/13/20 1034  Resp 17 06/13/20 1034  SpO2 99 % 06/13/20 1034  Vitals shown include unvalidated device data.  Last Pain:  Vitals:   06/13/20 0703  TempSrc: Temporal  PainSc: 7          Complications: No complications documented.

## 2020-06-13 NOTE — Discharge Instructions (Addendum)
  Diet: Resume home heart healthy regular diet.   Activity: No heavy lifting >20 pounds (children, pets, laundry, garbage) or strenuous activity until follow-up, but light activity and walking are encouraged. Do not drive or drink alcohol if taking narcotic pain medications.  Wound care: May shower with soapy water and pat dry (do not rub incisions), but no baths or submerging incision underwater until follow-up. (no swimming)   Medications: Resume all home medications. For mild to moderate pain: acetaminophen (Tylenol) or ibuprofen (if no kidney disease). Combining Tylenol with alcohol can substantially increase your risk of causing liver disease. Narcotic pain medications, if prescribed, can be used for severe pain, though may cause nausea, constipation, and drowsiness. Do not combine Tylenol and Norco within a 6 hour period as Norco contains Tylenol. If you do not need the narcotic pain medication, you do not need to fill the prescription.  Call office ([phone]) at any time if any questions, worsening pain, fevers/chills, bleeding, drainage from incision site, or other concerns.   AMBULATORY SURGERY  DISCHARGE INSTRUCTIONS   1) The drugs that you were given will stay in your system until tomorrow so for the next 24 hours you should not:  A) Drive an automobile B) Make any legal decisions C) Drink any alcoholic beverage   2) You may resume regular meals tomorrow.  Today it is better to start with liquids and gradually work up to solid foods.  You may eat anything you prefer, but it is better to start with liquids, then soup and crackers, and gradually work up to solid foods.   3) Please notify your doctor immediately if you have any unusual bleeding, trouble breathing, redness and pain at the surgery site, drainage, fever, or pain not relieved by medication.    4) Additional Instructions:        Please contact your physician with any problems or Same Day Surgery at  [phone], Monday through Friday 6 am to 4 pm, or [place] at [place] [facility] number at [phone]. 

## 2020-06-13 NOTE — Anesthesia Procedure Notes (Signed)
Procedure Name: Intubation Date/Time: 06/13/2020 8:40 AM Performed by: Nolon Lennert, RN Pre-anesthesia Checklist: Patient identified, Emergency Drugs available, Suction available and Patient being monitored Patient Re-evaluated:Patient Re-evaluated prior to induction Oxygen Delivery Method: Circle system utilized Preoxygenation: Pre-oxygenation with 100% oxygen Induction Type: IV induction and Rapid sequence Laryngoscope Size: Mac and 3 Grade View: Grade I Tube type: Oral Tube size: 7.0 mm Number of attempts: 1 Airway Equipment and Method: Stylet Placement Confirmation: ETT inserted through vocal cords under direct vision,  positive ETCO2 and breath sounds checked- equal and bilateral Secured at: 20 cm Tube secured with: Tape Dental Injury: Teeth and Oropharynx as per pre-operative assessment

## 2020-06-13 NOTE — Anesthesia Preprocedure Evaluation (Addendum)
Anesthesia Evaluation  Patient identified by MRN, date of birth, ID band Patient awake    Reviewed: Allergy & Precautions, NPO status , Patient's Chart, lab work & pertinent test results  History of Anesthesia Complications Negative for: history of anesthetic complications  Airway Mallampati: II  TM Distance: >3 FB Neck ROM: Full   Comment: Prior grade 1 view with miller 2 Dental  (+) Edentulous Upper, Edentulous Lower, Dental Advisory Given   Pulmonary shortness of breath and with exertion, asthma , neg sleep apnea, COPD,  COPD inhaler, Current Smoker and Patient abstained from smoking.,  Inhalers only PRN, takes couple times per month. Took one today  Possible undiagnosed OSA; patient says her doctor wants her to get a sleep study   Pulmonary exam normal breath sounds clear to auscultation       Cardiovascular Exercise Tolerance: Poor METS: 3 - Mets hypertension, (-) CAD and (-) Past MI (-) dysrhythmias  Rhythm:Regular Rate:Normal - Systolic murmurs    Neuro/Psych  Headaches, PSYCHIATRIC DISORDERS Depression    GI/Hepatic GERD  Medicated,(+)     (-) substance abuse  , Hepatitis -, CTreated w harvoni   Endo/Other  diabetes, Oral Hypoglycemic Agents  Renal/GU negative Renal ROS     Musculoskeletal  (+) Arthritis ,   Abdominal (+) + obese,   Peds  Hematology   Anesthesia Other Findings Past Medical History: No date: Arthritis     Comment:  whole body No date: Asthma     Comment:  inhaler in summer No date: Back pain No date: Bell's palsy     Comment:  12 yrs ago, right eye weaker No date: COPD (chronic obstructive pulmonary disease) (HCC)     Comment:  wheezing No date: Depression No date: Diabetes mellitus, type 2 (HCC) No date: Dyspnea No date: Headache     Comment:  about everyday No date: Hepatitis C No date: Hypertension No date: Kidney tumor (benign), left No date: Pneumonia     Comment:  in  past No date: Snake bite     Comment:  as a child  Reproductive/Obstetrics                            Anesthesia Physical Anesthesia Plan  ASA: III  Anesthesia Plan: General   Post-op Pain Management:    Induction: Intravenous and Rapid sequence  PONV Risk Score and Plan: 3 and Ondansetron, Dexamethasone and Midazolam  Airway Management Planned: Oral ETT  Additional Equipment: None  Intra-op Plan:   Post-operative Plan: Extubation in OR  Informed Consent: I have reviewed the patients History and Physical, chart, labs and discussed the procedure including the risks, benefits and alternatives for the proposed anesthesia with the patient or authorized representative who has indicated his/her understanding and acceptance.     Dental advisory given  Plan Discussed with: CRNA and Surgeon  Anesthesia Plan Comments: (Discussed risks of anesthesia with patient, including PONV, sore throat, lip/dental damage. Rare risks discussed as well, such as cardiorespiratory and neurological sequelae. Patient understands.)        Anesthesia Quick Evaluation

## 2020-06-13 NOTE — Anesthesia Postprocedure Evaluation (Signed)
Anesthesia Post Note  Patient: Sally Hughes  Procedure(s) Performed: XI ROBOTIC ASSISTED INCISIONAL HERNIA (N/A )  Patient location during evaluation: PACU Anesthesia Type: General Level of consciousness: awake and alert Pain management: pain level controlled Vital Signs Assessment: post-procedure vital signs reviewed and stable Respiratory status: spontaneous breathing, nonlabored ventilation, respiratory function stable and patient connected to nasal cannula oxygen Cardiovascular status: blood pressure returned to baseline and stable Postop Assessment: no apparent nausea or vomiting Anesthetic complications: no   No complications documented.   Last Vitals:  Vitals:   06/13/20 1127 06/13/20 1138  BP: 98/68 108/79  Pulse: 82 82  Resp: 14 14  Temp: (!) 36.3 C (!) 36.2 C  SpO2: 95% 92%    Last Pain:  Vitals:   06/13/20 1138  TempSrc: Temporal  PainSc: 4                  Arita Miss

## 2020-06-13 NOTE — Op Note (Signed)
Preoperative diagnosis: Incisional Hernia  Postoperative diagnosis: Incisiona Hernia  Procedure: Robotic assisted laparoscopic incisional hernia repair with mesh  Anesthesia: General  Surgeon: Dr. Windell Moment  Wound Classification: Clean  Specimen: None  Complications: None  Estimated Blood Loss: 72ml  Indications: A 59 year old female with symptomatic incisional hernia.   Findings: 1. 2 cm epigastric incisional hernia 4. Tension free repair achieved with 11.4 cm bard mesh and suture 5. Adequate hemostasis  Description of procedure: The patient was brought to the operating room and general anesthesia was induced. A time-out was completed verifying correct patient, procedure, site, positioning, and implant(s) and/or special equipment prior to beginning this procedure. Antibiotics were administered prior to making the incision. SCDs placed. The anterior abdominal wall was prepped and draped in the standard sterile fashion.   Umbilical area chosen for entry.  Veress needle placed and abdomen insufflated to 15cm without any dramatic increase in pressure.  Needle removed and optiview technique used to place 57mm port at same point.  No injury noted during placement. Exparel was infused in a TAP block. Two additional ports, 90mm x2 along lower abdomen were placed.  Xi robot then docked into place.  Hernia contents noted and reduced with combination of blunt, sharp dissection with scissors and fenestrated forceps.  Hemostasis achieved throughout this portion.  Once all hernia contents reduced, there was noted to be a 2 cm hernia.    Insufflation dropped to 10 mm and transfacial suture with 0 stratafix used to primarily close defect under minimal tension. Bard echo plus protected 11.4 cm mesh was placed within the abdominal cavity and secured to the abdominal wall centered over the defect using the 0 stratafix previously used to primarily close defect.  The mesh was then circumferentially  sutured into the anterior abdominal wall using 2-0 VLock x2.  Any bleeding noted during this portion was no longer actively bleeding by end of securing mesh and tightening the suture.    Robot was undocked. Abdomen then desufflated while camera within abdomen to ensure no signs of new bleed prior to removing camera and rest of ports completely.  All skin incisions closed with runninrg 4-0 Monocryl in a subcuticular fashion.  All wounds then dressed with Dermabond.  Patient was then successfully awakened and transferred to PACU in stable condition.  At the end of the procedure sponge and instrument counts were correct.

## 2020-06-13 NOTE — Interval H&P Note (Signed)
History and Physical Interval Note:  06/13/2020 8:03 AM  Sally Hughes  has presented today for surgery, with the diagnosis of K43.2 Incisional hernia w/o obstruction or gangrene.  The various methods of treatment have been discussed with the patient and family. After consideration of risks, benefits and other options for treatment, the patient has consented to  Procedure(s): XI ROBOTIC [place] (N/A) as a surgical intervention.  The patient's history has been reviewed, patient examined, no change in status, stable for surgery.  I have reviewed the patient's chart and labs.  Questions were answered to the patient's satisfaction.     Herbert Pun

## 2020-06-14 ENCOUNTER — Encounter: Payer: Self-pay | Admitting: General Surgery

## 2020-07-06 ENCOUNTER — Telehealth (INDEPENDENT_AMBULATORY_CARE_PROVIDER_SITE_OTHER): Payer: Medicare Other | Admitting: Psychiatry

## 2020-07-06 ENCOUNTER — Other Ambulatory Visit: Payer: Self-pay

## 2020-07-06 ENCOUNTER — Encounter: Payer: Self-pay | Admitting: Psychiatry

## 2020-07-06 DIAGNOSIS — F331 Major depressive disorder, recurrent, moderate: Secondary | ICD-10-CM

## 2020-07-06 DIAGNOSIS — F17200 Nicotine dependence, unspecified, uncomplicated: Secondary | ICD-10-CM | POA: Diagnosis not present

## 2020-07-06 DIAGNOSIS — G2581 Restless legs syndrome: Secondary | ICD-10-CM

## 2020-07-06 DIAGNOSIS — F411 Generalized anxiety disorder: Secondary | ICD-10-CM | POA: Diagnosis not present

## 2020-07-06 MED ORDER — BUSPIRONE HCL 10 MG PO TABS: 10.0000 mg | ORAL_TABLET | Freq: Three times a day (TID) | ORAL | 1 refills | Status: DC

## 2020-07-06 MED ORDER — ROPINIROLE HCL 0.5 MG PO TABS: 0.5000 mg | ORAL_TABLET | Freq: Every day | ORAL | 1 refills | Status: DC

## 2020-07-06 MED ORDER — HYDROXYZINE PAMOATE 50 MG PO CAPS: 50.0000 mg | ORAL_CAPSULE | Freq: Every evening | ORAL | 1 refills | Status: DC | PRN
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
## 2020-08-02 ENCOUNTER — Other Ambulatory Visit: Payer: Self-pay | Admitting: Psychiatry

## 2020-08-02 DIAGNOSIS — G2581 Restless legs syndrome: Secondary | ICD-10-CM

## 2020-08-02 DIAGNOSIS — F331 Major depressive disorder, recurrent, moderate: Secondary | ICD-10-CM

## 2020-08-02 DIAGNOSIS — F411 Generalized anxiety disorder: Secondary | ICD-10-CM

## 2020-08-14 ENCOUNTER — Telehealth (INDEPENDENT_AMBULATORY_CARE_PROVIDER_SITE_OTHER): Payer: Medicare Other | Admitting: Psychiatry

## 2020-08-14 ENCOUNTER — Encounter: Payer: Self-pay | Admitting: Psychiatry

## 2020-08-14 ENCOUNTER — Other Ambulatory Visit: Payer: Self-pay

## 2020-08-14 DIAGNOSIS — F411 Generalized anxiety disorder: Secondary | ICD-10-CM

## 2020-08-14 DIAGNOSIS — F17200 Nicotine dependence, unspecified, uncomplicated: Secondary | ICD-10-CM | POA: Diagnosis not present

## 2020-08-14 DIAGNOSIS — F331 Major depressive disorder, recurrent, moderate: Secondary | ICD-10-CM

## 2020-08-14 DIAGNOSIS — G2581 Restless legs syndrome: Secondary | ICD-10-CM | POA: Diagnosis not present

## 2020-08-14 MED ORDER — HYDROXYZINE PAMOATE 50 MG PO CAPS: ORAL_CAPSULE | ORAL | 1 refills | Status: DC

## 2020-08-14 MED ORDER — BUSPIRONE HCL 15 MG PO TABS: 15.0000 mg | ORAL_TABLET | Freq: Three times a day (TID) | ORAL | 1 refills | Status: DC

## 2020-08-14 MED ORDER — ROPINIROLE HCL 0.5 MG PO TABS: ORAL_TABLET | ORAL | 1 refills | Status: DC

## 2020-08-14 NOTE — Progress Notes (Signed)
Provider Location : [facility] Patient Location : Home  Participants: Patient , Provider  Virtual Visit via Video Note  I connected with Sally Hughes on 08/14/20 at  8:30 AM EDT by a video enabled telemedicine application and verified that I am speaking with the correct person using two identifiers.   I discussed the limitations of evaluation and management by telemedicine and the availability of in person appointments. The patient expressed understanding and agreed to proceed.     I discussed the assessment and treatment plan with the patient. The patient was provided an opportunity to ask questions and all were answered. The patient agreed with the plan and demonstrated an understanding of the instructions.   The patient was advised to call back or seek an in-person evaluation if the symptoms worsen or if the condition fails to improve as anticipated.   [facility] MD OP Progress Note  08/14/2020 8:58 AM Sally Hughes  MRN:  [id number]  Chief Complaint:  Chief Complaint    Follow-up     HPI: Sally Hughes is a 59 year old Caucasian female on disability, divorced, lives in [place], has a history of depression, anxiety, diabetes melitis, recent surgery for hernia repair, hypertension, hyperlipidemia, dizziness, headaches was evaluated by telemedicine today.  Patient today reports she was doing better on the medication for a few weeks.  She however reports since the past 3 days she is getting worse again.  She describes anxiety, sadness, being on edge, inability to focus and so on.  She reports it is mostly because of her psychosocial stressors.  The roommate with whom she stays is giving her trouble again.  She reports he abuses his prescription pills which are pain medications and has started falling again.  She reports this is a constant stressor for her.  She hence is trying to move out however has been unable to find a new place.  She has not been compliant with psychotherapy  sessions she agrees to call the therapist to schedule an appointment.  Patient does struggle with memory problems however reports she has upcoming appointment with neurology and she will have a discussion.  Patient is compliant on medications.  Denies side effects.  Patient denies any suicidality, homicidality or perceptual disturbances.  Patient denies any other concerns today.  Visit Diagnosis:    ICD-10-CM   1. MDD (major depressive disorder), recurrent episode, moderate (HCC)  F33.1 busPIRone (BUSPAR) 15 MG tablet  2. GAD (generalized anxiety disorder)  F41.1 busPIRone (BUSPAR) 15 MG tablet    hydrOXYzine (VISTARIL) 50 MG capsule  3. RLS (restless legs syndrome)  G25.81 rOPINIRole (REQUIP) 0.5 MG tablet  4. Tobacco use disorder  F17.200     Past Psychiatric History: I have reviewed past psychiatric history from my progress note on 07/06/2020.  Past trials of BuSpar, trazodone, hydroxyzine  Past Medical History:  Past Medical History:  Diagnosis Date  . Arthritis    whole body  . Asthma    inhaler in summer  . Back pain   . Bell's palsy    12 yrs ago, right eye weaker  . COPD (chronic obstructive pulmonary disease) (HCC)    wheezing  . Depression   . Diabetes mellitus, type 2 ([place])   . Dyspnea   . Headache    about everyday  . Hepatitis C   . Hypertension   . Kidney tumor (benign), left   . Pneumonia    in past  . Snake bite    as a child  Past Surgical History:  Procedure Laterality Date  . CATARACT EXTRACTION W/PHACO Left 04/19/2018   Procedure: CATARACT EXTRACTION PHACO AND INTRAOCULAR LENS PLACEMENT ([place]) LEFT IVA TOPICAL;  Surgeon: Leandrew Koyanagi, MD;  Location: [facility];  Service: Ophthalmology;  Laterality: Left;  . CATARACT EXTRACTION W/PHACO Right 05/19/2018   Procedure: CATARACT EXTRACTION PHACO AND INTRAOCULAR LENS PLACEMENT ([place])  RIGHT;  Surgeon: Leandrew Koyanagi, MD;  Location: [facility];  Service: Ophthalmology;   Laterality: Right;  . MICROLARYNGOSCOPY Right 01/13/2020   Procedure: MICROLARYNGOSCOPY WITH EXCISION OF VC LESION;  Surgeon: Beverly Gust, MD;  Location: [facility];  Service: ENT;  Laterality: Right;  Diabetic - oral meds  . TUBAL LIGATION    . TUMOR REMOVAL     benign tumor removed left kidney  . XI ROBOTIC ASSISTED VENTRAL HERNIA N/A 06/13/2020   Procedure: XI ROBOTIC ASSISTED INCISIONAL HERNIA;  Surgeon: Herbert Pun, MD;  Location: [facility];  Service: General;  Laterality: N/A;    Family Psychiatric History: I have reviewed family psychiatric history from my progress note on 07/06/2020  Family History:  Family History  Problem Relation Age of Onset  . Drug abuse Son     Social History: I have reviewed social history from my progress note on 07/06/2020 Social History   Socioeconomic History  . Marital status: Divorced    Spouse name: Not on file  . Number of children: 2  . Years of education: Not on file  . Highest education level: Not on file  Occupational History  . Not on file  Tobacco Use  . Smoking status: Current Every Day Smoker    Packs/day: 1.00    Years: 30.00    Pack years: 30.00    Types: Cigarettes  . Smokeless tobacco: Never Used  . Tobacco comment: Cutting back - reported on 07/06/20  Vaping Use  . Vaping Use: Former  . Substances: Nicotine  . Devices: Vuse  Substance and Sexual Activity  . Alcohol use: No  . Drug use: Not Currently    Types: Marijuana    Comment: last use one year ago - reported 07/06/20  . Sexual activity: Not on file  Other Topics Concern  . Not on file  Social History Narrative   Went up to 9 th grade   Used to be a weaver , worked in [employer]   Now on disability.   Social Determinants of Health   Financial Resource Strain:   . Difficulty of Paying Living Expenses: Not on file  Food Insecurity:   . Worried About Charity fundraiser in the Last Year: Not on file  . Ran Out of Food in the Last Year:  Not on file  Transportation Needs:   . Lack of Transportation (Medical): Not on file  . Lack of Transportation (Non-Medical): Not on file  Physical Activity:   . Days of Exercise per Week: Not on file  . Minutes of Exercise per Session: Not on file  Stress:   . Feeling of Stress : Not on file  Social Connections:   . Frequency of Communication with Friends and Family: Not on file  . Frequency of Social Gatherings with Friends and Family: Not on file  . Attends Religious Services: Not on file  . Active Member of Clubs or Organizations: Not on file  . Attends Archivist Meetings: Not on file  . Marital Status: Not on file    Allergies: No Known Allergies  Metabolic Disorder Labs: No results  found for: HGBA1C, MPG No results found for: PROLACTIN No results found for: CHOL, TRIG, HDL, CHOLHDL, VLDL, LDLCALC No results found for: TSH  Therapeutic Level Labs: No results found for: LITHIUM No results found for: VALPROATE No components found for:  CBMZ  Current Medications: Current Outpatient Medications  Medication Sig Dispense Refill  . albuterol (PROVENTIL HFA;VENTOLIN HFA) 108 (90 Base) MCG/ACT inhaler Inhale 2 puffs into the lungs every 6 (six) hours as needed for wheezing or shortness of breath.    Marland Kitchen amitriptyline (ELAVIL) 75 MG tablet Take 75 mg by mouth at bedtime.     . Blood Glucose Monitoring Suppl (TRUE METRIX METER) DEVI SMARTSIG:1 Unit(s) Via Meter Daily    . busPIRone (BUSPAR) 15 MG tablet Take 1 tablet (15 mg total) by mouth 3 (three) times daily. 90 tablet 1  . Cyanocobalamin (VITAMIN B12 PO) Take 1 tablet by mouth daily.     . cyclobenzaprine (FLEXERIL) 5 MG tablet Take 5 mg by mouth 3 (three) times daily as needed for muscle spasms.     Marland Kitchen EMGALITY 120 MG/ML SOAJ Inject 1 mL into the skin every 30 (thirty) days.    Eduard Roux (AIMOVIG) 140 MG/ML SOAJ Inject into the skin. (Patient not taking: Reported on 07/06/2020)    . hydrOXYzine (VISTARIL) 50 MG  capsule TAKE (1) CAPSULE BY MOUTH DAILY ATBEDTIME AS NEEDED FOR SLEEP AND ANXIETY. 30 capsule 1  . meclizine (ANTIVERT) 25 MG tablet Take 25 mg by mouth 3 (three) times daily as needed for dizziness or nausea.     . meloxicam (MOBIC) 15 MG tablet Take 15 mg by mouth daily.    . metFORMIN (GLUCOPHAGE-XR) 500 MG 24 hr tablet Take 500 mg by mouth 2 (two) times daily.    . metoprolol tartrate (LOPRESSOR) 25 MG tablet Take 12.5 mg by mouth in the morning and at bedtime.    Marland Kitchen omeprazole (PRILOSEC) 20 MG capsule Take 20 mg by mouth daily.    Marland Kitchen Pharmacist Choice Lancets MISC Apply topically as needed. (Patient not taking: Reported on 07/06/2020)    . pravastatin (PRAVACHOL) 40 MG tablet Take 40 mg by mouth daily.     Marland Kitchen rOPINIRole (REQUIP) 0.5 MG tablet TAKE (1) TABLET BY MOUTH DAILY AT BEDTIME FOR RESTLESS LEGS. 30 tablet 1  . TRUE METRIX BLOOD GLUCOSE TEST test strip SMARTSIG:1 Via Meter 4-5 Times Daily    . VITAMIN D PO Take 1 tablet by mouth daily.      No current facility-administered medications for this visit.     Musculoskeletal: Strength & Muscle Tone: UTA Gait & Station: normal Patient leans: N/A  Psychiatric Specialty Exam: Review of Systems  Psychiatric/Behavioral: Positive for dysphoric mood. The patient is nervous/anxious.   All other systems reviewed and are negative.   There were no vitals taken for this visit.There is no height or weight on file to calculate BMI.  General Appearance: Casual  Eye Contact:  Fair  Speech:  Normal Rate  Volume:  Normal  Mood:  Anxious and Depressed  Affect:  Congruent  Thought Process:  Goal Directed and Descriptions of Associations: Intact  Orientation:  Full (Time, Place, and Person)  Thought Content: Logical   Suicidal Thoughts:  No  Homicidal Thoughts:  No  Memory:  Immediate;   Fair Recent;   Fair Remote;   Fair  Judgement:  Fair  Insight:  Fair  Psychomotor Activity:  Normal  Concentration:  Concentration: Fair and Attention Span:  Fair  Recall:  Fair  Fund of Knowledge: Fair  Language: Fair  Akathisia:  No  Handed:  Right  AIMS (if indicated): UTA  Assets:  Communication Skills Desire for Improvement Housing Social Support  ADL's:  Intact  Cognition: WNL  Sleep:  improving   Screenings: GAD-7     Video Visit from 07/06/2020 in [facility] from 05/01/2020 in [facility]  Total GAD-7 Score 17 20    PHQ2-9     Video Visit from 07/06/2020 in [facility] from 05/01/2020 in [facility]  PHQ-2 Total Score 5 5  PHQ-9 Total Score 17 19       Assessment and Plan: Sally Hughes is a 59 year old Caucasian female who has a history of depression, anxiety, diabetes melitis, back pain, hypertension, hyperlipidemia, headaches, recent hernia repair was evaluated by telemedicine today.  Patient is biologically predisposed given her family history, history of trauma.  Patient with multiple medical problems as well as her son's drug abuse, housing situation which are all psychosocial stressors for her.  Patient continues to struggle with anxiety and depressive symptoms and will benefit from continued medication readjustment and psychotherapy sessions.  Plan as noted below.  Plan GAD-some progress Increase BuSpar to 15 mg p.o. 3 times daily Hydroxyzine at reduced dose to 50 mg p.o. nightly as needed She is currently on amitriptyline 75 mg p.o. nightly-prescribed by neurology.  She will have a discussion with neurology to either increase the dosage or discontinue the amitriptyline so that an SSRI/SNRI can be started for her current mood symptoms.  MDD-some progress Increase BuSpar to 15 mg p.o. 3 times daily Patient is noncompliant with CBT, encourage patient to call the clinic to schedule another appointment with Ms.Hussami  Insomnia-improving Amitriptyline 75 mg p.o. nightly Requip 0.5  mg p.o. nightly for RLS  Tobacco use disorder-improving Provided counseling, she is cutting back.  Follow-up in clinic in 4 weeks or sooner if needed.  I have spent atleast 20 minutes face to face by video with patient today. More than 50 % of the time was spent for preparing to see the patient ( e.g., review of test, records ), ordering medications and test ,psychoeducation and supportive psychotherapy and care coordination,as well as documenting clinical information in electronic health record. This note was generated in part or whole with voice recognition software. Voice recognition is usually quite accurate but there are transcription errors that can and very often do occur. I apologize for any typographical errors that were not detected and corrected.      Ursula Alert, MD 08/14/2020, 8:58 AM

## 2020-08-30 ENCOUNTER — Other Ambulatory Visit: Payer: Self-pay | Admitting: Internal Medicine

## 2020-08-30 DIAGNOSIS — Z1231 Encounter for screening mammogram for malignant neoplasm of breast: Secondary | ICD-10-CM

## 2020-09-14 ENCOUNTER — Other Ambulatory Visit: Payer: Self-pay

## 2020-09-14 ENCOUNTER — Telehealth (INDEPENDENT_AMBULATORY_CARE_PROVIDER_SITE_OTHER): Payer: Medicare Other | Admitting: Psychiatry

## 2020-09-14 ENCOUNTER — Encounter: Payer: Self-pay | Admitting: Psychiatry

## 2020-09-14 DIAGNOSIS — F17200 Nicotine dependence, unspecified, uncomplicated: Secondary | ICD-10-CM

## 2020-09-14 DIAGNOSIS — Z91199 Patient's noncompliance with other medical treatment and regimen due to unspecified reason: Secondary | ICD-10-CM

## 2020-09-14 DIAGNOSIS — F331 Major depressive disorder, recurrent, moderate: Secondary | ICD-10-CM | POA: Diagnosis not present

## 2020-09-14 DIAGNOSIS — G2581 Restless legs syndrome: Secondary | ICD-10-CM

## 2020-09-14 DIAGNOSIS — F411 Generalized anxiety disorder: Secondary | ICD-10-CM

## 2020-09-14 DIAGNOSIS — Z9119 Patient's noncompliance with other medical treatment and regimen: Secondary | ICD-10-CM

## 2020-09-14 MED ORDER — BUSPIRONE HCL 30 MG PO TABS: 30.0000 mg | ORAL_TABLET | Freq: Two times a day (BID) | ORAL | 1 refills | Status: DC
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
## 2020-09-21 ENCOUNTER — Encounter: Payer: Self-pay | Admitting: Emergency Medicine

## 2020-09-21 ENCOUNTER — Other Ambulatory Visit: Payer: Self-pay

## 2020-09-21 ENCOUNTER — Emergency Department
Admission: EM | Admit: 2020-09-21 | Discharge: 2020-09-21 | Disposition: A | Discharge status: Home / Self Care | Payer: Medicare Other | Attending: Emergency Medicine | Admitting: Emergency Medicine

## 2020-09-21 ENCOUNTER — Emergency Department: Payer: Medicare Other

## 2020-09-21 DIAGNOSIS — Z7984 Long term (current) use of oral hypoglycemic drugs: Secondary | ICD-10-CM | POA: Diagnosis not present

## 2020-09-21 DIAGNOSIS — J449 Chronic obstructive pulmonary disease, unspecified: Secondary | ICD-10-CM | POA: Insufficient documentation

## 2020-09-21 DIAGNOSIS — E119 Type 2 diabetes mellitus without complications: Secondary | ICD-10-CM | POA: Insufficient documentation

## 2020-09-21 DIAGNOSIS — W19XXXA Unspecified fall, initial encounter: Secondary | ICD-10-CM

## 2020-09-21 DIAGNOSIS — Z79899 Other long term (current) drug therapy: Secondary | ICD-10-CM | POA: Diagnosis not present

## 2020-09-21 DIAGNOSIS — R42 Dizziness and giddiness: Secondary | ICD-10-CM | POA: Insufficient documentation

## 2020-09-21 DIAGNOSIS — W1830XA Fall on same level, unspecified, initial encounter: Secondary | ICD-10-CM | POA: Diagnosis not present

## 2020-09-21 DIAGNOSIS — F17210 Nicotine dependence, cigarettes, uncomplicated: Secondary | ICD-10-CM | POA: Diagnosis not present

## 2020-09-21 DIAGNOSIS — S0990XA Unspecified injury of head, initial encounter: Secondary | ICD-10-CM | POA: Diagnosis not present

## 2020-09-21 DIAGNOSIS — I10 Essential (primary) hypertension: Secondary | ICD-10-CM | POA: Insufficient documentation

## 2020-09-21 LAB — BASIC METABOLIC PANEL
Anion gap: 8 (ref 5–15)
BUN: 16 mg/dL (ref 6–20)
CO2: 25 mmol/L (ref 22–32)
Calcium: 9 mg/dL (ref 8.9–10.3)
Chloride: 106 mmol/L (ref 98–111)
Creatinine, Ser: 0.9 mg/dL (ref 0.44–1.00)
GFR, Estimated: >60 mL/min (ref >60)
Glucose, Bld: 123 mg/dL — ABNORMAL HIGH (ref 70–99)
Potassium: 4.5 mmol/L (ref 3.5–5.1)
Sodium: 139 mmol/L (ref 135–145)

## 2020-09-21 LAB — CBC
HCT: 40.7 % (ref 36.0–46.0)
Hemoglobin: 13.6 g/dL (ref 12.0–15.0)
MCH: 30.7 pg (ref 26.0–34.0)
MCHC: 33.4 g/dL (ref 30.0–36.0)
MCV: 91.9 fL (ref 80.0–100.0)
Platelets: 246 10*3/uL (ref 150–400)
RBC: 4.43 MIL/uL (ref 3.87–5.11)
RDW: 13.1 % (ref 11.5–15.5)
WBC: 12.4 10*3/uL — ABNORMAL HIGH (ref 4.0–10.5)
nRBC: 0 % (ref 0.0–0.2)

## 2020-09-21 IMAGING — CT CT HEAD W/O CM
4 series · 15 of 47 positions shown, 17 images · non-contrast
Comparison: [DATE]

CLINICAL DATA: Fall.  Head injury.  Dizziness.

EXAM:
CT HEAD WITHOUT CONTRAST
TECHNIQUE: Contiguous axial images were obtained from the base of the skull
through the vertex without intravenous contrast.

[Series 2: head bone · axial · 0.41mm/px · z∈[-149,-133]mm · 2 of 78 slices shown]
[im 8/78  bone]
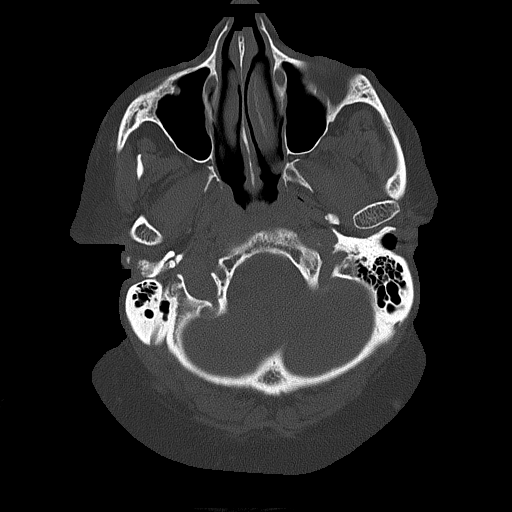
[im 16/78  bone]
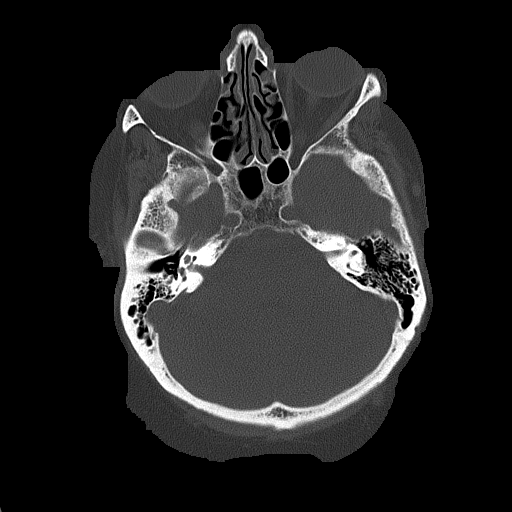

[Series 3: ax head wo · axial · 0.35mm/px · z∈[-117,-9]mm · 7 of 31 slices shown, 9 images]
[im 4/31  brain]
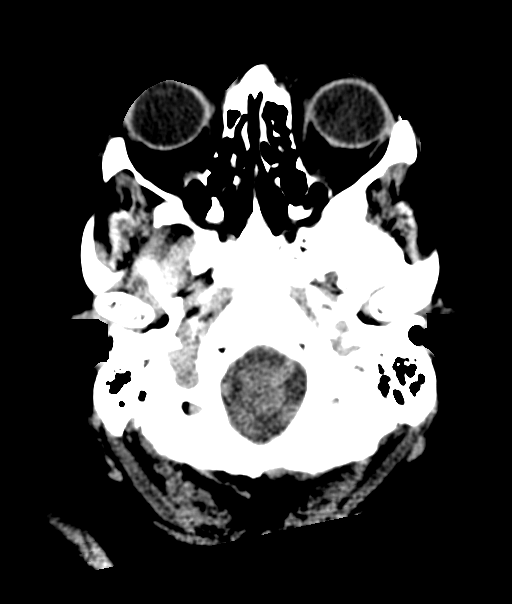
[im 4/31  bone]
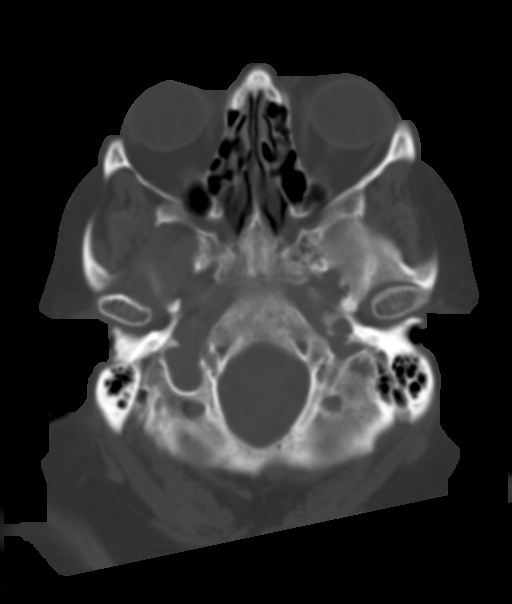
[im 8/31  brain]
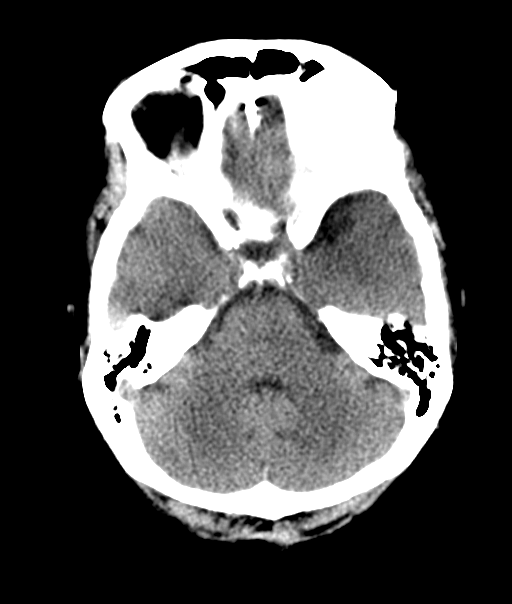
[im 12/31  brain]
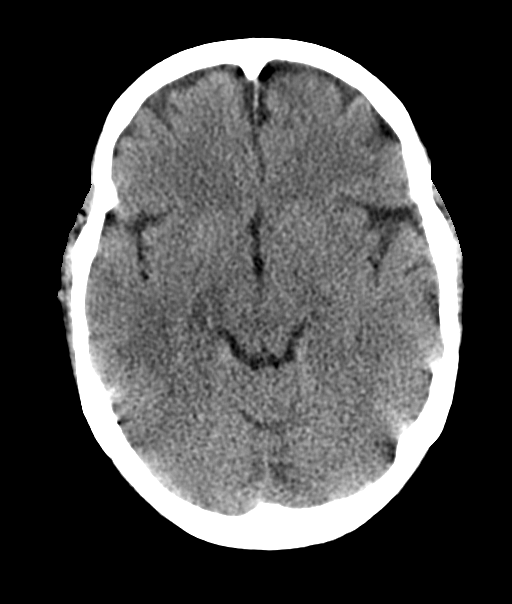
[im 16/31  brain]
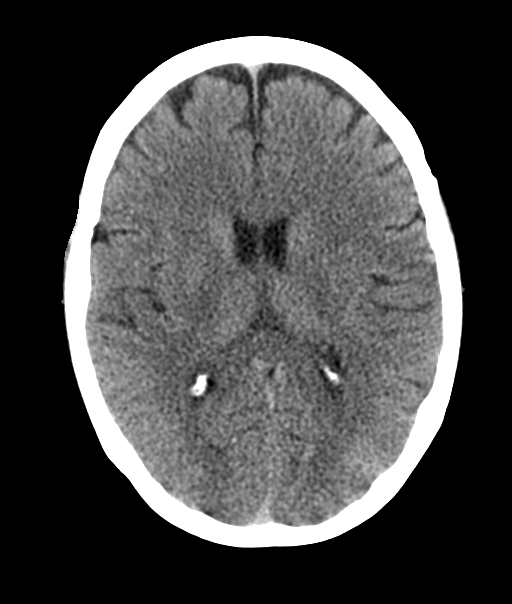
[im 19/31  brain]
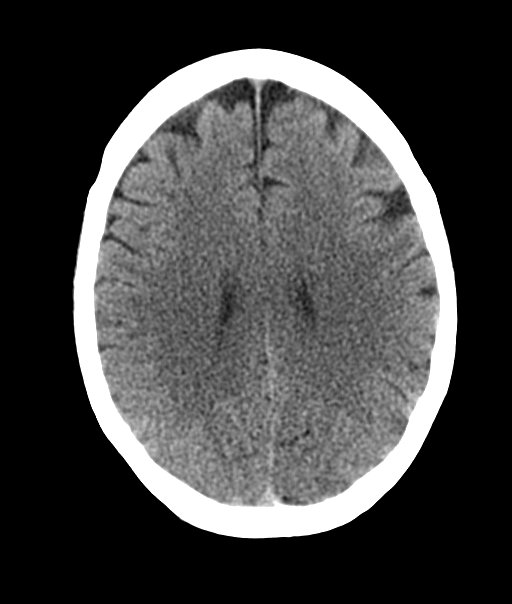
[im 19/31  bone]
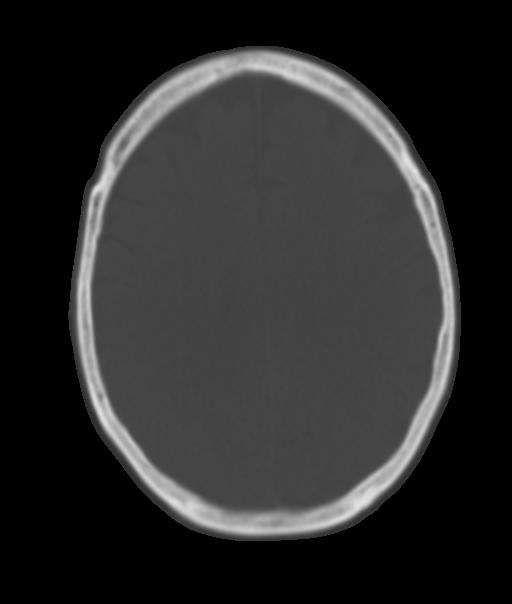
[im 23/31  brain]
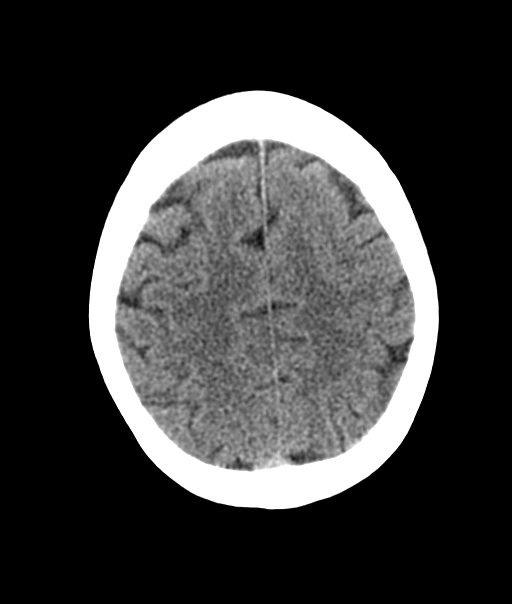
[im 27/31  brain]
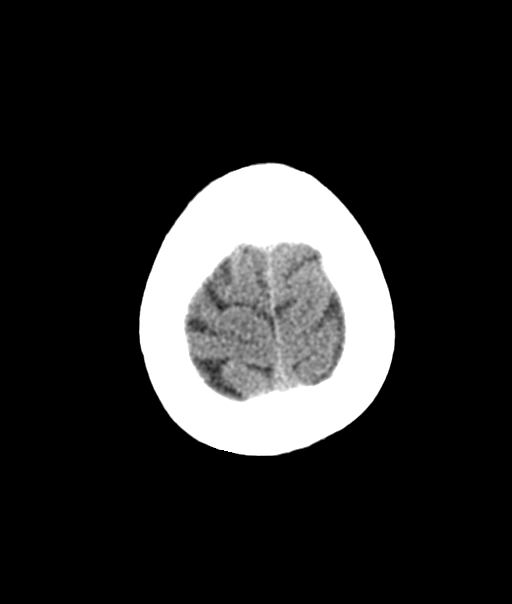

[Series 4: coronal soft tissue · coronal · 0.29mm/px · 3 of 60 slices shown]
[im 20/60  brain]
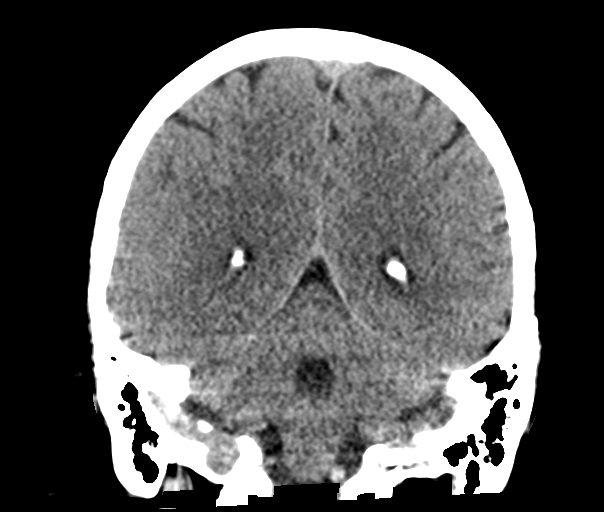
[im 27/60  brain]
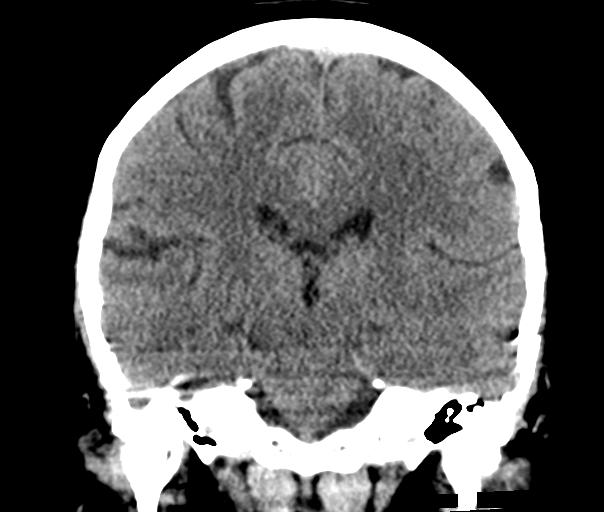
[im 33/60  brain]
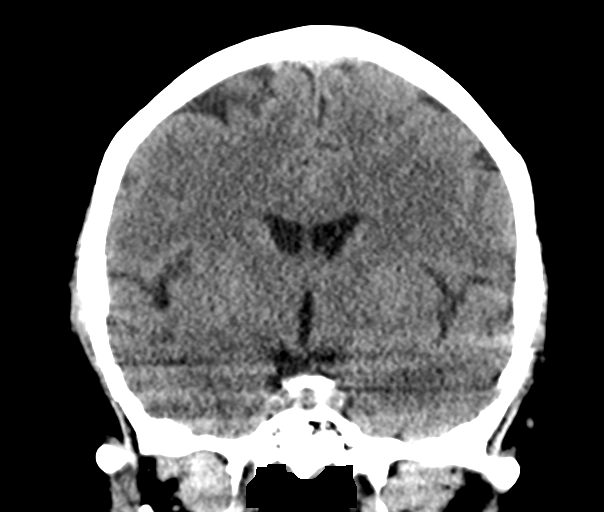

[Series 5: sagittal soft tissue · sagittal · 0.29mm/px · 3 of 49 slices shown]
[im 17/49  brain]
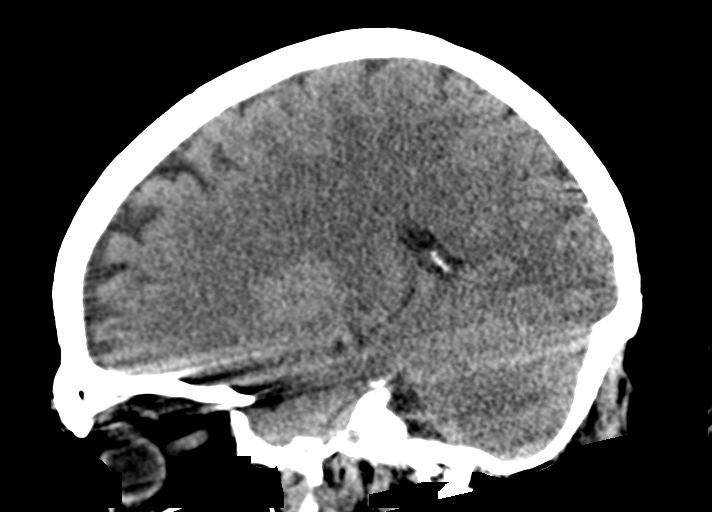
[im 25/49  brain]
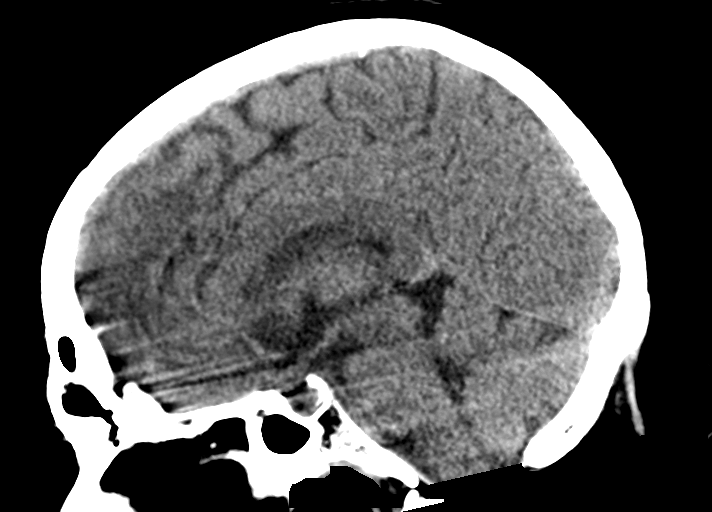
[im 33/49  brain]
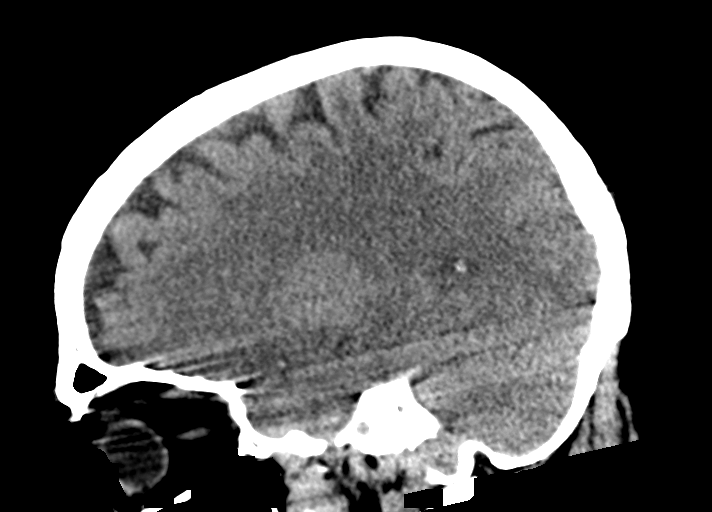

[15 of 47 positions shown; findings below may reference images not displayed]

FINDINGS: Brain: There is no evidence for acute hemorrhage, hydrocephalus,
mass lesion, or abnormal extra-axial fluid collection. No definite
CT evidence for acute infarction.

Vascular: No hyperdense vessel or unexpected calcification.

Skull: No evidence for fracture. No worrisome lytic or sclerotic
lesion.

Sinuses/Orbits: The visualized paranasal sinuses and mastoid air
cells are clear. Visualized portions of the globes and intraorbital
fat are unremarkable.

Other: None.
IMPRESSION: Unremarkable exam.  No acute intracranial abnormality.

## 2020-09-21 NOTE — ED Provider Notes (Signed)
[facility] Emergency Department Provider Note ____________________________________________   First MD Initiated Contact with Patient 09/21/20 1157     (approximate)  I have reviewed the triage vital signs and the nursing notes.  HISTORY  Chief Complaint Fall   HPI Sally Hughes is a 59 y.o. femalewho presents to the ED for evaluation of fall.  Chart review indicates history of depression, DM, arthritis.  Patient reports that she was exercising this morning, got dizzy and fell to the ground.  She reports a history of generalized weakness and recurrent falls, and reports that she was not using her normal cane this morning while doing exercises with a friend.  She reports that she "may have" passed out.  She reports presyncopal lightheaded dizziness prior to the fall, but denies any shortness of breath, headache or chest pain prior to the fall.  Denies tongue biting or incontinence, or seizure-like activity.  She reports that she feels "fine" now and has no complaints acutely.   Past Medical History:  Diagnosis Date  . Arthritis    whole body  . Asthma    inhaler in summer  . Back pain   . Bell's palsy    12 yrs ago, right eye weaker  . COPD (chronic obstructive pulmonary disease) (HCC)    wheezing  . Depression   . Diabetes mellitus, type 2 ([place])   . Dyspnea   . Headache    about everyday  . Hepatitis C   . Hypertension   . Kidney tumor (benign), left   . Pneumonia    in past  . Snake bite    as a child    Patient Active Problem List   Diagnosis Date Noted  . Noncompliance with treatment regimen 09/14/2020  . Tobacco use disorder 08/14/2020  . MDD (major depressive disorder), recurrent episode, moderate ([place]) 07/06/2020  . GAD (generalized anxiety disorder) 07/06/2020  . RLS (restless legs syndrome) 07/06/2020  . Headache disorder 06/01/2020  . Loss of memory 08/04/2019  . Multiple fractures of ribs, bilateral, subsequent encounter  for fracture with routine healing 08/10/2017    Past Surgical History:  Procedure Laterality Date  . CATARACT EXTRACTION W/PHACO Left 04/19/2018   Procedure: CATARACT EXTRACTION PHACO AND INTRAOCULAR LENS PLACEMENT ([place]) LEFT IVA TOPICAL;  Surgeon: Leandrew Koyanagi, MD;  Location: [place];  Service: Ophthalmology;  Laterality: Left;  . CATARACT EXTRACTION W/PHACO Right 05/19/2018   Procedure: CATARACT EXTRACTION PHACO AND INTRAOCULAR LENS PLACEMENT ([place])  RIGHT;  Surgeon: Leandrew Koyanagi, MD;  Location: [place];  Service: Ophthalmology;  Laterality: Right;  . MICROLARYNGOSCOPY Right 01/13/2020   Procedure: MICROLARYNGOSCOPY WITH EXCISION OF VC LESION;  Surgeon: Beverly Gust, MD;  Location: [place];  Service: ENT;  Laterality: Right;  Diabetic - oral meds  . TUBAL LIGATION    . TUMOR REMOVAL     benign tumor removed left kidney  . XI ROBOTIC ASSISTED VENTRAL HERNIA N/A 06/13/2020   Procedure: XI ROBOTIC ASSISTED INCISIONAL HERNIA;  Surgeon: Herbert Pun, MD;  Location: [facility];  Service: General;  Laterality: N/A;    Prior to Admission medications   Medication Sig Start Date End Date Taking? Authorizing Provider  acetaminophen (TYLENOL) 500 MG tablet Take by mouth. Patient not taking: Reported on 09/14/2020    [provider]  albuterol (PROVENTIL HFA;VENTOLIN HFA) 108 (90 Base) MCG/ACT inhaler Inhale 2 puffs into the lungs every 6 (six) hours as needed for wheezing or shortness of breath.  [provider]  amitriptyline (ELAVIL) 100 MG tablet Take by mouth. Patient not taking: Reported on 09/14/2020 09/04/20   [provider]  amitriptyline (ELAVIL) 75 MG tablet Take 75 mg by mouth at bedtime.     [provider]  Blood Glucose Monitoring Suppl (TRUE METRIX METER) DEVI SMARTSIG:1 Unit(s) Via Meter Daily 05/22/20   [provider]  busPIRone (BUSPAR) 30 MG tablet Take 1 tablet (30 mg total) by  mouth 2 (two) times daily. 09/14/20   Ursula Alert, MD  Cyanocobalamin (VITAMIN B12 PO) Take 1 tablet by mouth daily.     [provider]  cyclobenzaprine (FLEXERIL) 5 MG tablet Take 5 mg by mouth 3 (three) times daily as needed for muscle spasms.     [provider]  DULERA 100-5 MCG/ACT AERO Inhale 2 puffs into the lungs 2 (two) times daily. 08/29/20   [provider]  EMGALITY 120 MG/ML SOAJ Inject 1 mL into the skin every 30 (thirty) days. 06/01/20   [provider]  Erenumab-aooe (AIMOVIG) 140 MG/ML SOAJ Inject into the skin.  06/07/20   [provider]  hydrOXYzine (ATARAX/VISTARIL) 25 MG tablet Take 25 mg by mouth daily. 08/22/20   [provider]  hydrOXYzine (VISTARIL) 50 MG capsule TAKE (1) CAPSULE BY MOUTH DAILY ATBEDTIME AS NEEDED FOR SLEEP AND ANXIETY. 08/14/20   Ursula Alert, MD  meclizine (ANTIVERT) 25 MG tablet Take 25 mg by mouth 3 (three) times daily as needed for dizziness or nausea.  04/26/20   [provider]  meloxicam (MOBIC) 15 MG tablet Take 15 mg by mouth daily.    [provider]  metFORMIN (GLUCOPHAGE-XR) 500 MG 24 hr tablet Take 500 mg by mouth 2 (two) times daily. 05/30/20   [provider]  metoprolol tartrate (LOPRESSOR) 25 MG tablet Take 12.5 mg by mouth in the morning and at bedtime. 03/20/20   [provider]  mometasone-formoterol (DULERA) 100-5 MCG/ACT AERO Inhale into the lungs. 08/29/20 08/29/21  [provider]  omeprazole (PRILOSEC) 20 MG capsule Take 20 mg by mouth daily. 01/10/20   [provider]  Pharmacist Choice Lancets MISC Apply topically as needed.  01/17/20   [provider]  pravastatin (PRAVACHOL) 40 MG tablet Take 40 mg by mouth daily.     [provider]  rOPINIRole (REQUIP) 0.5 MG tablet TAKE (1) TABLET BY MOUTH DAILY AT BEDTIME FOR RESTLESS LEGS. 08/14/20   Ursula Alert, MD  TRUE METRIX BLOOD GLUCOSE TEST test strip  SMARTSIG:1 Via Meter 4-5 Times Daily 06/26/20   [provider]  VITAMIN D PO Take 1 tablet by mouth daily.     [provider]    Allergies Patient has no known allergies.  Family History  Problem Relation Age of Onset  . Drug abuse Son     Social History Social History   Tobacco Use  . Smoking status: Current Every Day Smoker    Packs/day: 1.00    Years: 30.00    Pack years: 30.00    Types: Cigarettes  . Smokeless tobacco: Never Used  . Tobacco comment: Cutting back - reported on 07/06/20  Vaping Use  . Vaping Use: Former  . Substances: Nicotine  . Devices: Vuse  Substance Use Topics  . Alcohol use: No  . Drug use: Not Currently    Types: Marijuana    Comment: last use one year ago - reported 07/06/20    Review of Systems  Constitutional: No fever/chills Eyes: No visual  changes. ENT: No sore throat. Cardiovascular: Denies chest pain. Respiratory: Denies shortness of breath. Gastrointestinal: No abdominal pain.  No nausea, no vomiting.  No diarrhea.  No constipation. Genitourinary: Negative for dysuria. Musculoskeletal: Negative for back pain. Skin: Negative for rash. Neurological: Negative for headaches, focal weakness or numbness.  ____________________________________________   PHYSICAL EXAM:  VITAL SIGNS: Vitals:   09/21/20 1300 09/21/20 1330  BP: 108/77 108/73  Pulse: 92 95  Resp:  16  Temp:    SpO2:  98%      Constitutional: Alert and oriented. Well appearing and in no acute distress.  Slow speech pattern, but articulates well. Eyes: Conjunctivae are normal. PERRL. EOMI. Head: Atraumatic. Nose: No congestion/rhinnorhea. Mouth/Throat: Mucous membranes are moist.  Oropharynx non-erythematous. Neck: No stridor. No cervical spine tenderness to palpation. Cardiovascular: Normal rate, regular rhythm. Grossly normal heart sounds.  Good peripheral circulation. Respiratory: Normal respiratory effort.  No retractions. Lungs  CTAB. Gastrointestinal: Soft , nondistended, nontender to palpation. No CVA tenderness. Musculoskeletal: No lower extremity tenderness nor edema.  No joint effusions. No signs of acute trauma. Neurologic:  Normal speech and language. No gross focal neurologic deficits are appreciated. No gait instability noted. Cranial nerves II through XII intact 5/5 strength and sensation in all 4 extremities Skin:  Skin is warm, dry and intact. No rash noted. Psychiatric: Mood and affect are normal. Speech is slow.  ____________________________________________   LABS (all labs ordered are listed, but only abnormal results are displayed)  Labs Reviewed  BASIC METABOLIC PANEL - Abnormal; Notable for the following components:      Result Value   Glucose, Bld 123 (*)    All other components within normal limits  CBC - Abnormal; Notable for the following components:   WBC 12.4 (*)    All other components within normal limits  URINALYSIS, COMPLETE (UACMP) WITH MICROSCOPIC  CBG MONITORING, ED   ____________________________________________  12 Lead EKG  Sinus rhythm, rate of 98 bpm.  Normal axis and intervals.  No evidence of acute ischemia. ____________________________________________  RADIOLOGY  ED MD interpretation: CT head reviewed by me without evidence of acute intracranial pathology.  Official radiology report(s): CT HEAD WO CONTRAST  Result Date: 09/21/2020 CLINICAL DATA:  Fall.  Head injury.  Dizziness. EXAM: CT HEAD WITHOUT CONTRAST TECHNIQUE: Contiguous axial images were obtained from the base of the skull through the vertex without intravenous contrast. COMPARISON:  04/07/2017 FINDINGS: Brain: There is no evidence for acute hemorrhage, hydrocephalus, mass lesion, or abnormal extra-axial fluid collection. No definite CT evidence for acute infarction. Vascular: No hyperdense vessel or unexpected calcification. Skull: No evidence for fracture. No worrisome lytic or sclerotic lesion.  Sinuses/Orbits: The visualized paranasal sinuses and mastoid air cells are clear. Visualized portions of the globes and intraorbital fat are unremarkable. Other: None. IMPRESSION: Unremarkable exam.  No acute intracranial abnormality. Electronically Signed   By: Misty Stanley M.D.   On: 09/21/2020 12:55    ____________________________________________   PROCEDURES and INTERVENTIONS  Procedure(s) performed (including Critical Care):  Procedures  Medications - No data to display  ____________________________________________   MDM / ED COURSE   59 year old woman presents to the ED with fall, possible syncope, without evidence of acute pathology and amenable to outpatient management.  Normal vitals on room air.  Exam without focal neurologic deficits, signs of trauma or any distress.  CT head shows no acute intracranial pathology, ICH or CVA.  Blood work is generally reassuring without evidence of acute derangements.  Her EKG shows normal intervals  and no evidence of cardiac etiology of a possible syncopal episode.  She was observed on telemetry for greater than 3 hours without acute events and ambulates at her baseline.  We discussed return precautions for the ED and patient is medically stable for discharge home.   Clinical Course as of Sep 21 1416  Fri Sep 21, 2020  1415 Reassessed.  Patient reports that she feels well.  Ambulated with the nurse without difficulty independently.  We discussed return precautions for the ED   [DS]    Clinical Course User Index [DS] Vladimir Crofts, MD    ____________________________________________   FINAL CLINICAL IMPRESSION(S) / ED DIAGNOSES  Final diagnoses:  Fall, initial encounter     ED Discharge Orders    None       Kelin Borum Tamala Julian   Note:  This document was prepared using Dragon voice recognition software and may include unintentional dictation errors.   Vladimir Crofts, MD 09/21/20 [id number]

## 2020-09-21 NOTE — ED Triage Notes (Signed)
Pt via EMS from home. Pt states she got dizzy last night and dizzy today and fell. Pt states that she does not remember hitting her head. Denies blood thinners. Per EMS, pt is on a lot of narcotics. Pt is alert but slow to answer with slurred speed on arrival. Pt states she is having lower back pain. Denies taking any medication at this time.

## 2020-09-21 NOTE — ED Notes (Signed)
This RN and [place], [place] got pt up and ambulated to toilet. Pt able to ambulate with standby assist. MD made aware.

## 2020-09-21 NOTE — Discharge Instructions (Signed)
Your CT scan did not show any bleeding in your brain or signs of stroke. Blood work was normal for you.  Return to the ED with any worsening symptoms, strokelike symptoms or fevers

## 2020-09-21 NOTE — ED Notes (Signed)
This RN on phone with pt's family, Sharyn Lull, and updated on POC

## 2020-09-21 NOTE — ED Notes (Signed)
[employer] called for transport

## 2020-09-21 NOTE — ED Notes (Signed)
Spoke with Dr. Cheri Fowler, MD regarding pt, requesting to order CT head

## 2020-09-21 NOTE — ED Notes (Signed)
First Nurse Note: Pt to ED via [facility] from home for fall. Pt unsure how she fell. Pt was outside when she fell but does not remember going outside. Pt is in NAD.   CBG 138

## 2020-09-24 ENCOUNTER — Telehealth: Payer: Self-pay

## 2020-09-24 NOTE — Telephone Encounter (Signed)
pt called left message that she is having side affect of the medications and she needs to speak with the doctor. about changes.

## 2020-09-24 NOTE — Telephone Encounter (Signed)
Returned call to patient, someone else picked up the phone and said patient is not available right now.

## 2020-09-26 ENCOUNTER — Telehealth: Payer: Self-pay

## 2020-09-26 ENCOUNTER — Other Ambulatory Visit: Payer: Self-pay

## 2020-09-26 ENCOUNTER — Telehealth (INDEPENDENT_AMBULATORY_CARE_PROVIDER_SITE_OTHER): Payer: Medicare Other | Admitting: Psychiatry

## 2020-09-26 ENCOUNTER — Encounter: Payer: Self-pay | Admitting: Psychiatry

## 2020-09-26 ENCOUNTER — Telehealth: Payer: Self-pay | Admitting: Psychiatry

## 2020-09-26 DIAGNOSIS — F17200 Nicotine dependence, unspecified, uncomplicated: Secondary | ICD-10-CM | POA: Diagnosis not present

## 2020-09-26 DIAGNOSIS — G2581 Restless legs syndrome: Secondary | ICD-10-CM

## 2020-09-26 DIAGNOSIS — F411 Generalized anxiety disorder: Secondary | ICD-10-CM | POA: Diagnosis not present

## 2020-09-26 DIAGNOSIS — Z91199 Patient's noncompliance with other medical treatment and regimen due to unspecified reason: Secondary | ICD-10-CM

## 2020-09-26 DIAGNOSIS — F331 Major depressive disorder, recurrent, moderate: Secondary | ICD-10-CM | POA: Diagnosis not present

## 2020-09-26 DIAGNOSIS — Z9119 Patient's noncompliance with other medical treatment and regimen: Secondary | ICD-10-CM

## 2020-09-26 MED ORDER — LAMOTRIGINE 25 MG PO TABS: ORAL_TABLET | ORAL | 0 refills | Status: DC

## 2020-09-26 NOTE — Telephone Encounter (Signed)
Courtney from Dr. Hart [place] office left a message she wanted to speak to you about this patient please call her back when you have time.

## 2020-09-26 NOTE — Telephone Encounter (Signed)
Attempted to reach patient several times.  Each time Probation officer tried her friend picked up the phone and said she was not available.  However was able to give a number after multiple attempts.  Contacted patient at [phone] picked up the phone after multiple attempts.  Could not leave voicemail since voicemail was not set up.  Patient reported that she was staying with her son since she had to be in town for her doctor's appointment yesterday.  She is planning to return home today.  She is interested in medication changes and would like to discuss that.  Discussed with patient that writer has a slot open at 1 PM today and she can be evaluated in a virtual appointment.  She agreed.  We will send communication to front desk staff.

## 2020-09-26 NOTE — Patient Instructions (Signed)
Lamotrigine tablets What is this medicine? LAMOTRIGINE (la MOE tri jeen) is used to control seizures in adults and children with epilepsy and Lennox-Gastaut syndrome. It is also used in adults to treat bipolar disorder. This medicine may be used for other purposes; ask your health care provider or pharmacist if you have questions. COMMON BRAND NAME(S): Lamictal, Subvenite What should I tell my health care provider before I take this medicine? They need to know if you have any of these conditions:  aseptic meningitis during prior use of lamotrigine  depression  folate deficiency  kidney disease  liver disease  suicidal thoughts, plans, or attempt; a previous suicide attempt by you or a family member  an unusual or allergic reaction to lamotrigine or other seizure medications, other medicines, foods, dyes, or preservatives  pregnant or trying to get pregnant  breast-feeding How should I use this medicine? Take this medicine by mouth with a glass of water. Follow the directions on the prescription label. Do not chew these tablets. If this medicine upsets your stomach, take it with food or milk. Take your doses at regular intervals. Do not take your medicine more often than directed. A special MedGuide will be given to you by the pharmacist with each new prescription and refill. Be sure to read this information carefully each time. Talk to your pediatrician regarding the use of this medicine in children. While this drug may be prescribed for children as young as 2 years for selected conditions, precautions do apply. Overdosage: If you think you have taken too much of this medicine contact a poison control center or emergency room at once. NOTE: This medicine is only for you. Do not share this medicine with others. What if I miss a dose? If you miss a dose, take it as soon as you can. If it is almost time for your next dose, take only that dose. Do not take double or extra doses. What may  interact with this medicine?  atazanavir  carbamazepine  female hormones, including contraceptive or birth control pills  lopinavir  methotrexate  phenobarbital  phenytoin  primidone  pyrimethamine  rifampin  ritonavir  trimethoprim  valproic acid This list may not describe all possible interactions. Give your health care provider a list of all the medicines, herbs, non-prescription drugs, or dietary supplements you use. Also tell them if you smoke, drink alcohol, or use illegal drugs. Some items may interact with your medicine. What should I watch for while using this medicine? Visit your doctor or health care provider for regular checks on your progress. If you take this medicine for seizures, wear a Medic Alert bracelet or necklace. Carry an identification card with information about your condition, medicines, and doctor or health care provider. It is important to take this medicine exactly as directed. When first starting treatment, your dose will need to be adjusted slowly. It may take weeks or months before your dose is stable. You should contact your doctor or health care provider if your seizures get worse or if you have any new types of seizures. Do not stop taking this medicine unless instructed by your doctor or health care provider. Stopping your medicine suddenly can increase your seizures or their severity. This medicine may cause serious skin reactions. They can happen weeks to months after starting the medicine. Contact your health care provider right away if you notice fevers or flu-like symptoms with a rash. The rash may be red or purple and then turn into blisters or peeling   of the skin. Or, you might notice a red rash with swelling of the face, lips or lymph nodes in your neck or under your arms. You may get drowsy, dizzy, or have blurred vision. Do not drive, use machinery, or do anything that needs mental alertness until you know how this medicine affects you. To  reduce dizzy or fainting spells, do not sit or stand up quickly, especially if you are an older patient. Alcohol can increase drowsiness and dizziness. Avoid alcoholic drinks. If you are taking this medicine for bipolar disorder, it is important to report any changes in your mood to your doctor or health care provider. If your condition gets worse, you get mentally depressed, feel very hyperactive or manic, have difficulty sleeping, or have thoughts of hurting yourself or committing suicide, you need to get help from your health care provider right away. If you are a caregiver for someone taking this medicine for bipolar disorder, you should also report these behavioral changes right away. The use of this medicine may increase the chance of suicidal thoughts or actions. Pay special attention to how you are responding while on this medicine. Your mouth may get dry. Chewing sugarless gum or sucking hard candy, and drinking plenty of water may help. Contact your doctor if the problem does not go away or is severe. Women who become pregnant while using this medicine may enroll in the [place] Antiepileptic Drug Pregnancy Registry by calling 1-888-233-2334. This registry collects information about the safety of antiepileptic drug use during pregnancy. This medicine may cause a decrease in folic acid. You should make sure that you get enough folic acid while you are taking this medicine. Discuss the foods you eat and the vitamins you take with your health care provider. What side effects may I notice from receiving this medicine? Side effects that you should report to your doctor or health care professional as soon as possible:  allergic reactions like skin rash, itching or hives, swelling of the face, lips, or tongue  changes in vision  depressed mood  elevated mood, decreased need for sleep, racing thoughts, impulsive behavior  loss of balance or coordination  mouth sores  rash, fever, and  swollen lymph nodes  redness, blistering, peeling or loosening of the skin, including inside the mouth  right upper belly pain  seizures  severe muscle pain  signs and symptoms of aseptic meningitis such as stiff neck and sensitivity to light, headache, drowsiness, fever, nausea, vomiting, rash  signs of infection - fever or chills, cough, sore throat, pain or difficulty passing urine  suicidal thoughts or other mood changes  swollen lymph nodes  trouble walking  unusual bruising or bleeding  unusually weak or tired  yellowing of the eyes or skin Side effects that usually do not require medical attention (report to your doctor or health care professional if they continue or are bothersome):  diarrhea  dizziness  dry mouth  stuffy nose  tiredness  tremors  trouble sleeping This list may not describe all possible side effects. Call your doctor for medical advice about side effects. You may report side effects to FDA at 1-800-FDA-1088. Where should I keep my medicine? Keep out of reach of children. Store at room temperature between 15 and 30 degrees C (59 and 86 degrees F). Throw away any unused medicine after the expiration date. NOTE: This sheet is a summary. It may not cover all possible information. If you have questions about this medicine, talk to your doctor,   pharmacist, or health care provider.  2020 Elsevier/Gold Standard (2019-01-28 15:03:40)  

## 2020-09-26 NOTE — Telephone Encounter (Signed)
Attempted to reach Dr.Kalisetti , left Voice mail.
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
## 2020-10-01 ENCOUNTER — Other Ambulatory Visit: Payer: Self-pay | Admitting: Psychiatry

## 2020-10-01 DIAGNOSIS — G2581 Restless legs syndrome: Secondary | ICD-10-CM

## 2020-10-16 ENCOUNTER — Other Ambulatory Visit: Payer: Self-pay

## 2020-10-16 ENCOUNTER — Telehealth (INDEPENDENT_AMBULATORY_CARE_PROVIDER_SITE_OTHER): Payer: Medicare Other | Admitting: Psychiatry

## 2020-10-16 DIAGNOSIS — Z5329 Procedure and treatment not carried out because of patient's decision for other reasons: Secondary | ICD-10-CM

## 2020-10-16 NOTE — Progress Notes (Signed)
No response to call or text or video invite  

## 2020-10-17 ENCOUNTER — Ambulatory Visit (INDEPENDENT_AMBULATORY_CARE_PROVIDER_SITE_OTHER): Payer: Self-pay | Admitting: Licensed Clinical Social Worker

## 2020-10-17 DIAGNOSIS — Z5329 Procedure and treatment not carried out because of patient's decision for other reasons: Secondary | ICD-10-CM

## 2020-10-17 NOTE — Progress Notes (Signed)
LCSW counselor tried to connect with patient for scheduled appointment via phone without success. LCSW counselor left message for patient to call office number to reschedule OPT appointment.

## 2020-10-23 ENCOUNTER — Other Ambulatory Visit: Payer: Self-pay | Admitting: Psychiatry

## 2020-10-23 DIAGNOSIS — F331 Major depressive disorder, recurrent, moderate: Secondary | ICD-10-CM

## 2020-10-23 DIAGNOSIS — F411 Generalized anxiety disorder: Secondary | ICD-10-CM

## 2020-10-29 ENCOUNTER — Other Ambulatory Visit: Payer: Self-pay | Admitting: Psychiatry

## 2020-10-29 DIAGNOSIS — F411 Generalized anxiety disorder: Secondary | ICD-10-CM

## 2020-11-23 ENCOUNTER — Other Ambulatory Visit: Payer: Self-pay | Admitting: Psychiatry

## 2020-11-23 DIAGNOSIS — F331 Major depressive disorder, recurrent, moderate: Secondary | ICD-10-CM

## 2020-11-23 DIAGNOSIS — G2581 Restless legs syndrome: Secondary | ICD-10-CM

## 2020-11-23 DIAGNOSIS — F411 Generalized anxiety disorder: Secondary | ICD-10-CM

## 2020-12-05 ENCOUNTER — Other Ambulatory Visit: Payer: Self-pay | Admitting: Psychiatry

## 2020-12-05 DIAGNOSIS — G2581 Restless legs syndrome: Secondary | ICD-10-CM

## 2020-12-10 ENCOUNTER — Other Ambulatory Visit: Payer: Self-pay | Admitting: Psychiatry

## 2020-12-10 DIAGNOSIS — G2581 Restless legs syndrome: Secondary | ICD-10-CM

## 2021-04-09 ENCOUNTER — Other Ambulatory Visit: Payer: Self-pay | Admitting: Physician Assistant

## 2021-04-09 DIAGNOSIS — R413 Other amnesia: Secondary | ICD-10-CM

## 2021-04-22 ENCOUNTER — Other Ambulatory Visit: Payer: Self-pay

## 2021-04-22 ENCOUNTER — Ambulatory Visit
Admission: RE | Admit: 2021-04-22 | Discharge: 2021-04-22 | Disposition: A | Discharge status: Home / Self Care | Payer: Medicare Other | Source: Ambulatory Visit | Attending: Physician Assistant | Admitting: Physician Assistant

## 2021-04-22 DIAGNOSIS — R413 Other amnesia: Secondary | ICD-10-CM | POA: Diagnosis not present
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
## 2021-05-20 ENCOUNTER — Encounter: Payer: Self-pay | Admitting: Emergency Medicine

## 2021-05-20 ENCOUNTER — Emergency Department
Admission: EM | Admit: 2021-05-20 | Discharge: 2021-05-20 | Disposition: A | Discharge status: Home / Self Care | Payer: Medicare Other | Attending: Emergency Medicine | Admitting: Emergency Medicine

## 2021-05-20 ENCOUNTER — Emergency Department: Payer: Medicare Other

## 2021-05-20 ENCOUNTER — Other Ambulatory Visit: Payer: Self-pay

## 2021-05-20 DIAGNOSIS — H6523 Chronic serous otitis media, bilateral: Secondary | ICD-10-CM | POA: Insufficient documentation

## 2021-05-20 DIAGNOSIS — Z79899 Other long term (current) drug therapy: Secondary | ICD-10-CM | POA: Diagnosis not present

## 2021-05-20 DIAGNOSIS — F17210 Nicotine dependence, cigarettes, uncomplicated: Secondary | ICD-10-CM | POA: Diagnosis not present

## 2021-05-20 DIAGNOSIS — E119 Type 2 diabetes mellitus without complications: Secondary | ICD-10-CM | POA: Diagnosis not present

## 2021-05-20 DIAGNOSIS — R059 Cough, unspecified: Secondary | ICD-10-CM | POA: Diagnosis present

## 2021-05-20 DIAGNOSIS — J449 Chronic obstructive pulmonary disease, unspecified: Secondary | ICD-10-CM | POA: Insufficient documentation

## 2021-05-20 DIAGNOSIS — I10 Essential (primary) hypertension: Secondary | ICD-10-CM | POA: Diagnosis not present

## 2021-05-20 DIAGNOSIS — J40 Bronchitis, not specified as acute or chronic: Secondary | ICD-10-CM | POA: Diagnosis not present

## 2021-05-20 DIAGNOSIS — R062 Wheezing: Secondary | ICD-10-CM

## 2021-05-20 DIAGNOSIS — Z20822 Contact with and (suspected) exposure to covid-19: Secondary | ICD-10-CM | POA: Diagnosis not present

## 2021-05-20 LAB — BASIC METABOLIC PANEL
Anion gap: 7 (ref 5–15)
BUN: 15 mg/dL (ref 6–20)
CO2: 28 mmol/L (ref 22–32)
Calcium: 9 mg/dL (ref 8.9–10.3)
Chloride: 100 mmol/L (ref 98–111)
Creatinine, Ser: 0.94 mg/dL (ref 0.44–1.00)
GFR, Estimated: >60 mL/min (ref >60)
Glucose, Bld: 100 mg/dL — ABNORMAL HIGH (ref 70–99)
Potassium: 5.4 mmol/L — ABNORMAL HIGH (ref 3.5–5.1)
Sodium: 135 mmol/L (ref 135–145)

## 2021-05-20 LAB — CBC
HCT: 41 % (ref 36.0–46.0)
Hemoglobin: 13.8 g/dL (ref 12.0–15.0)
MCH: 31.7 pg (ref 26.0–34.0)
MCHC: 33.7 g/dL (ref 30.0–36.0)
MCV: 94 fL (ref 80.0–100.0)
Platelets: 273 10*3/uL (ref 150–400)
RBC: 4.36 MIL/uL (ref 3.87–5.11)
RDW: 13.2 % (ref 11.5–15.5)
WBC: 10.2 10*3/uL (ref 4.0–10.5)
nRBC: 0 % (ref 0.0–0.2)

## 2021-05-20 LAB — RESP PANEL BY RT-PCR (FLU A&B, COVID) ARPGX2
Influenza A by PCR: NEGATIVE
Influenza B by PCR: NEGATIVE
SARS Coronavirus 2 by RT PCR: NEGATIVE

## 2021-05-20 LAB — TROPONIN I (HIGH SENSITIVITY): Troponin I (High Sensitivity): <2 ng/L (ref <18)

## 2021-05-20 IMAGING — CR DG CHEST 2V
1 series · 2 of 2 positions shown · non-contrast
Comparison: [DATE].

CLINICAL DATA: Cough, shortness of breath and sore throat with
headache for 2 weeks. Chills and back pain.

EXAM:
CHEST - 2 VIEW

[Series 1: dg chest 2 view · 0.14mm/px · 2 of 2 slices shown]
[im 1/2]
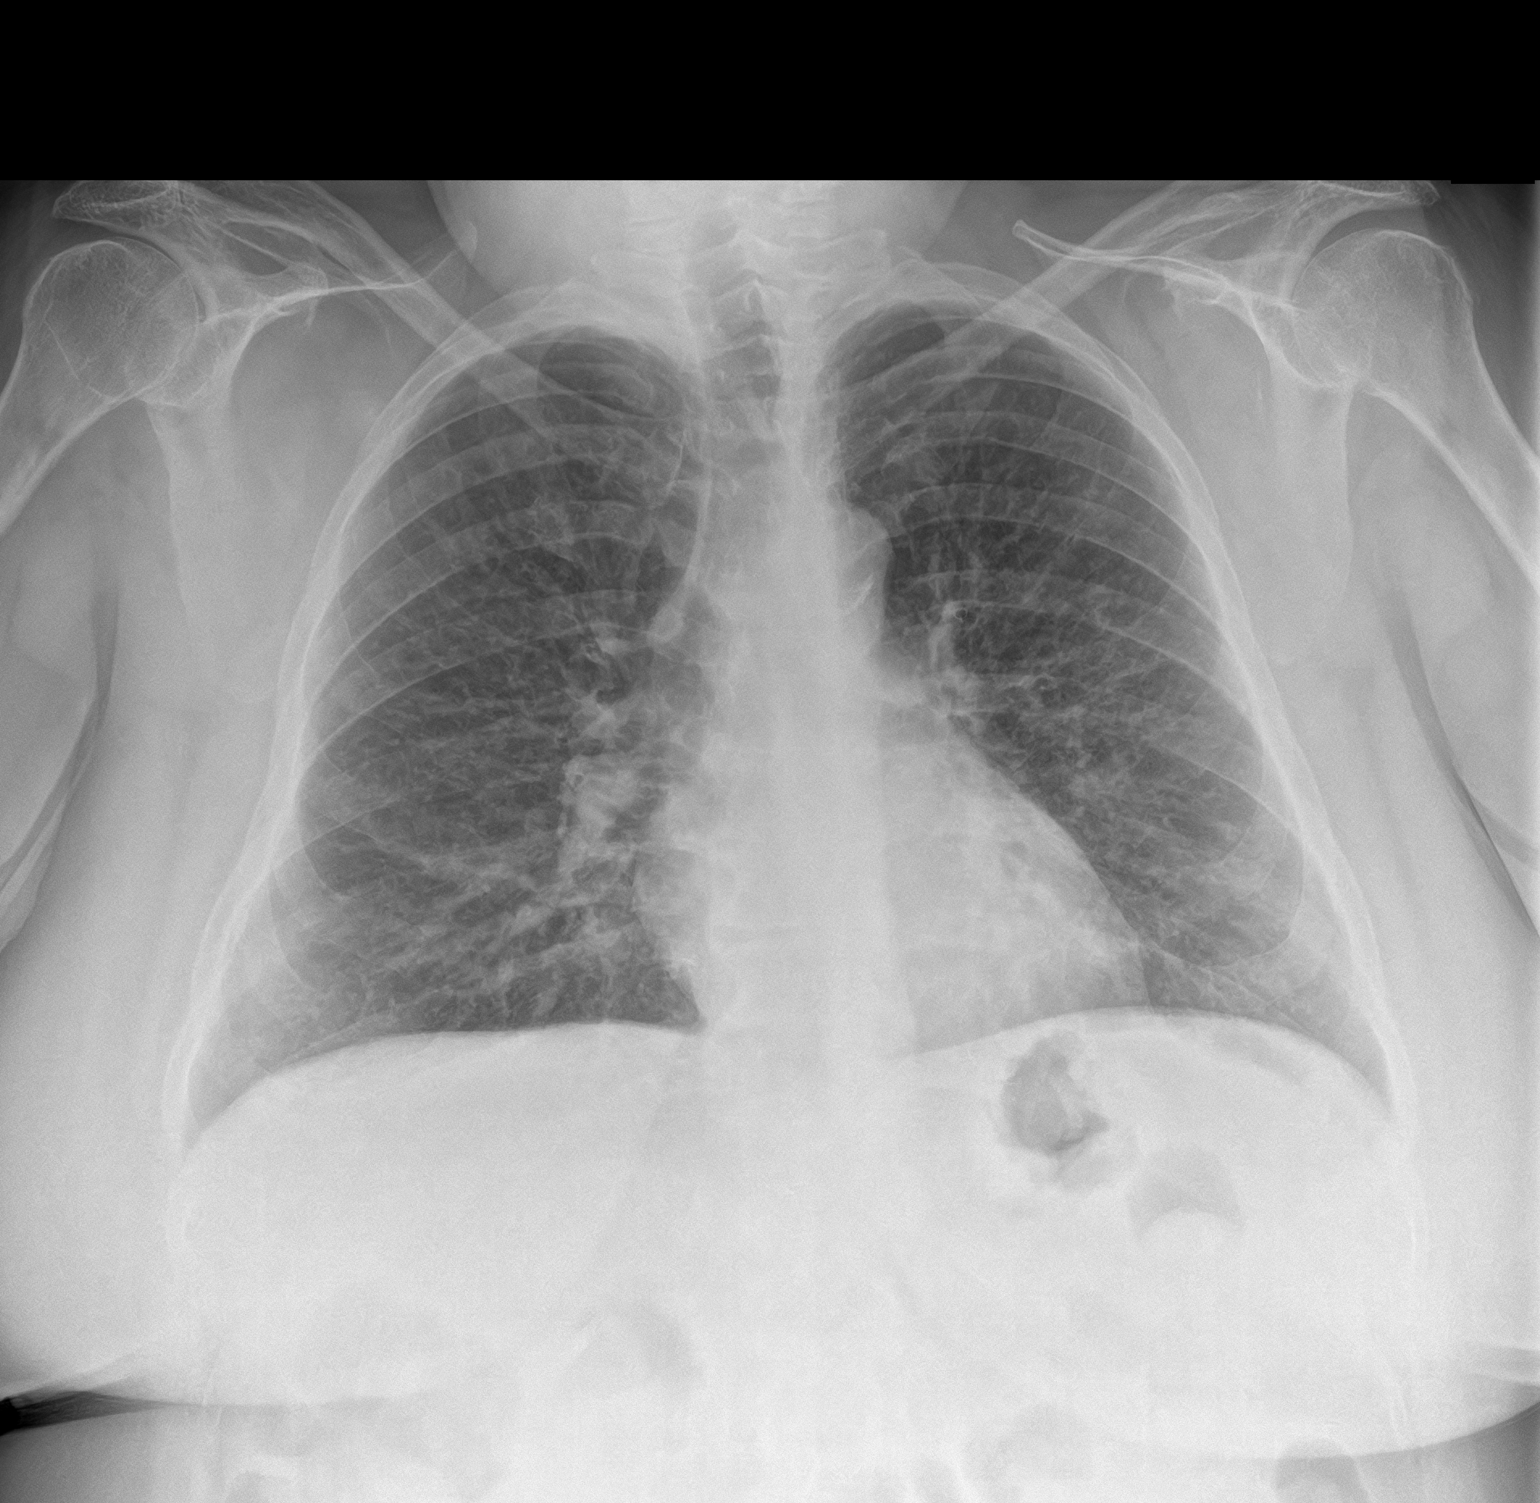
[im 2/2]
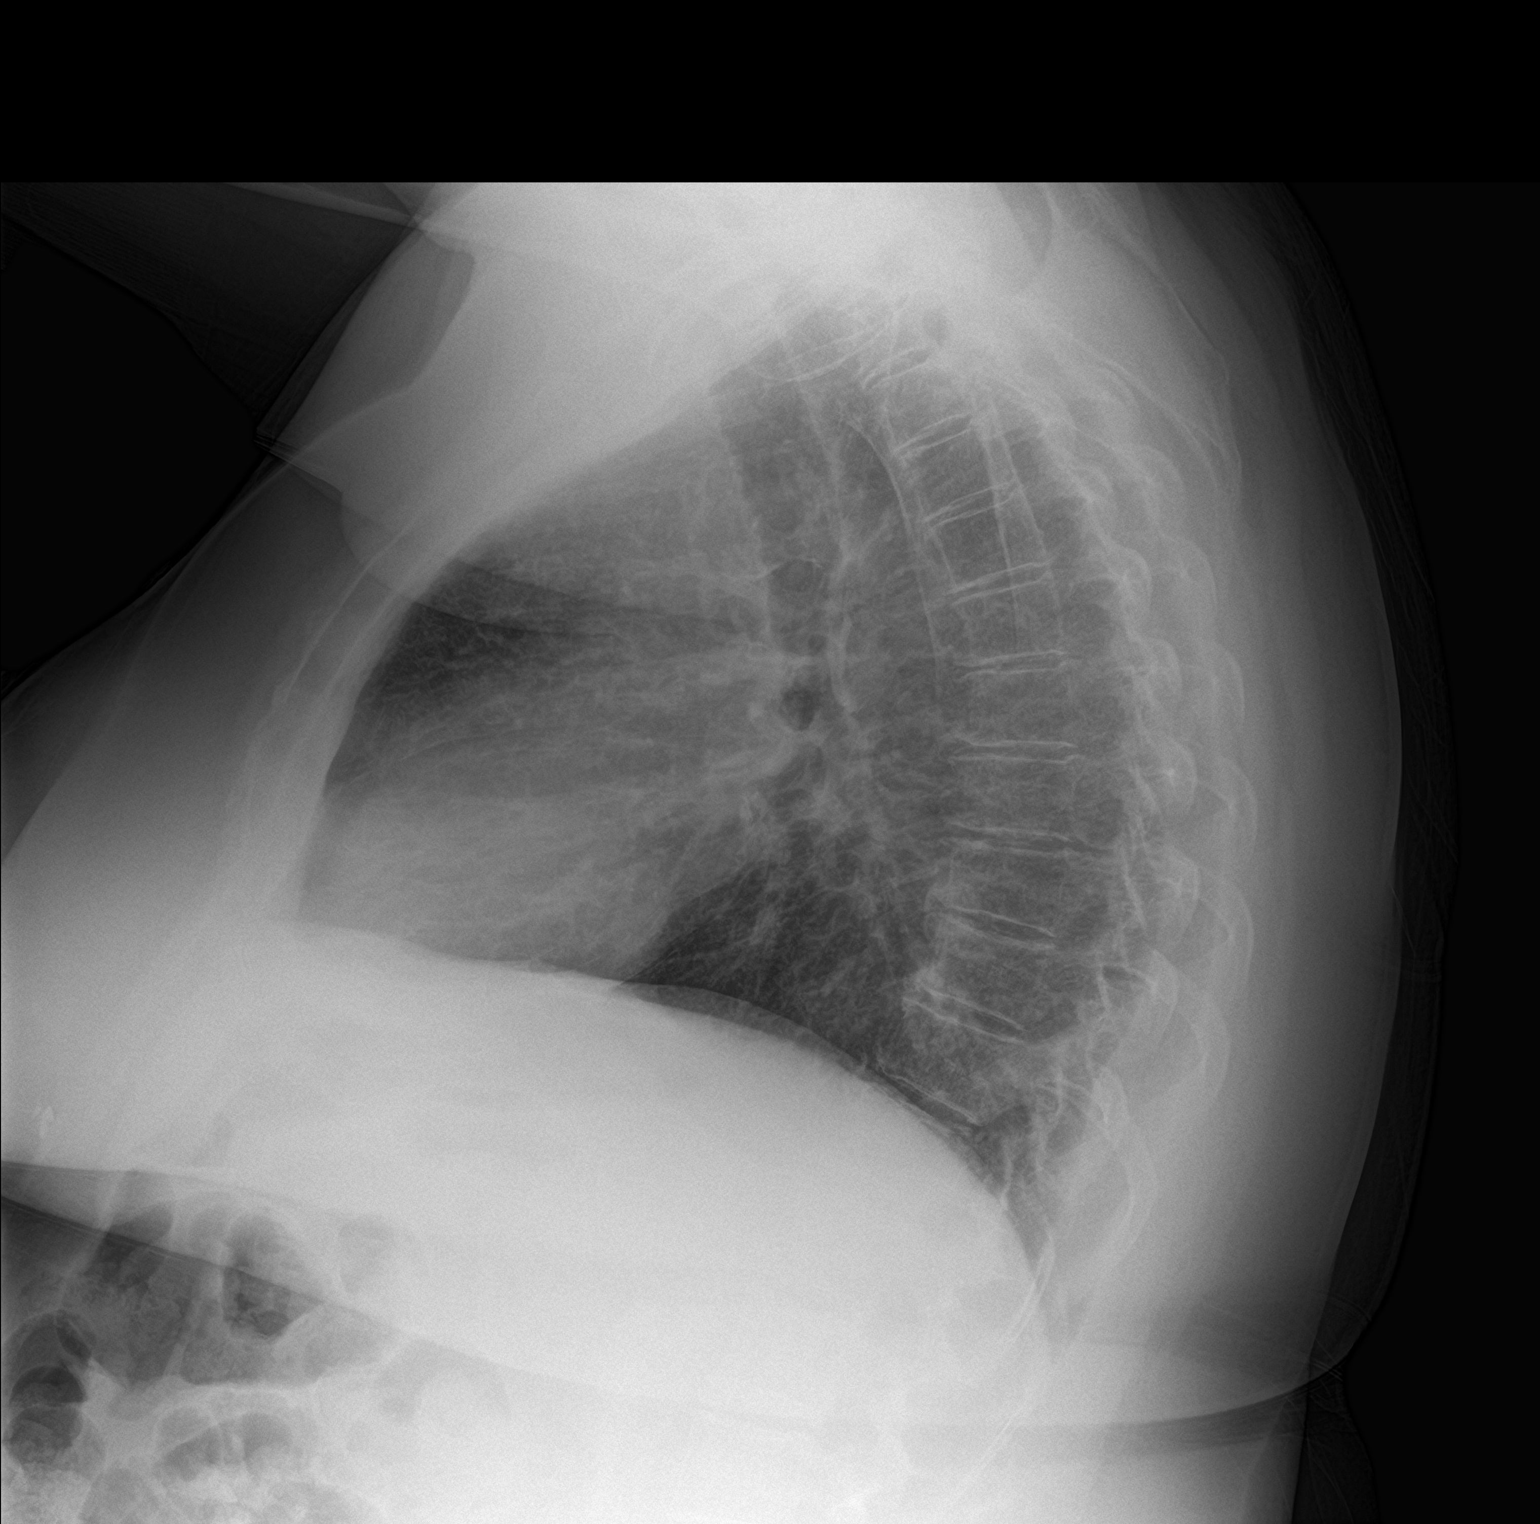

[2 of 2 positions shown; findings below may reference images not displayed]

FINDINGS: Trachea is midline. Cardiomediastinal contours and hilar structures
are normal.

Lungs are clear.  No pneumothorax.

No pleural effusion.

On limited assessment no acute skeletal process.
IMPRESSION: No acute cardiopulmonary disease.

## 2021-05-20 MED ORDER — NICOTINE 21 MG/24HR TD PT24: 21.0000 mg | MEDICATED_PATCH | TRANSDERMAL | 11 refills | Status: DC

## 2021-05-20 MED ORDER — AMOXICILLIN-POT CLAVULANATE 875-125 MG PO TABS: 1.0000 | ORAL_TABLET | Freq: Two times a day (BID) | ORAL | 0 refills | Status: AC

## 2021-05-20 MED ORDER — ALBUTEROL SULFATE HFA 108 (90 BASE) MCG/ACT IN AERS: 2.0000 | INHALATION_SPRAY | Freq: Four times a day (QID) | RESPIRATORY_TRACT | 0 refills | Status: DC | PRN

## 2021-05-20 MED ORDER — PREDNISONE 20 MG PO TABS: 60.0000 mg | ORAL_TABLET | Freq: Every day | ORAL | 0 refills | Status: AC

## 2021-05-20 MED ORDER — BENZONATATE 100 MG PO CAPS: 100.0000 mg | ORAL_CAPSULE | Freq: Three times a day (TID) | ORAL | 0 refills | Status: DC | PRN

## 2021-05-20 MED ORDER — IPRATROPIUM-ALBUTEROL 0.5-2.5 (3) MG/3ML IN SOLN
3.0000 mL | Freq: Once | RESPIRATORY_TRACT | Status: AC
  Administered 2021-05-20: 3 mL via RESPIRATORY_TRACT
  Filled 2021-05-20: qty 3

## 2021-05-20 MED ORDER — PREDNISONE 20 MG PO TABS
60.0000 mg | ORAL_TABLET | Freq: Once | ORAL | Status: AC
  Administered 2021-05-20: 60 mg via ORAL
  Filled 2021-05-20: qty 3

## 2021-05-20 NOTE — ED Triage Notes (Signed)
C/O sore throat, headaches x 2 weeks.  Also c/o difficulty breathing, thoracic back pain, chills.  This morning noticed blood from left ear on q-tip.   AAOx3.  Skin warm and dry. NAD

## 2021-05-20 NOTE — ED Provider Notes (Signed)
[facility] Emergency Department Provider Note  ____________________________________________   Event Date/Time   First MD Initiated Contact with Patient 05/20/21 1433     (approximate)  I have reviewed the triage vital signs and the nursing notes.   HISTORY  Chief Complaint Shortness of Breath    HPI Sally Hughes is a 60 y.o. female with past medical history as below here with cough, shortness of breath, left ear pain.  The patient states that for the last 2 weeks, she has had cough, shortness of breath, and congestion.  She has had wheezing.  She has a history of COPD and smoking as well as asthma, feels similar to her prior episodes of URIs.  She states she has also had bilateral ear pressure.  She had some small amount of bleeding from her left ear today, but this was after she tried to clear it out with a Q-tip.  Denies any tinnitus.  She feels some mild shortness of breath, particularly with exertion.  No chest pain.  No leg swelling.  No known fevers.  Her son did have COVID 2 weeks ago, but she has been vaccinated with a booster.  She has not been using an inhaler as she did not have 1 at home.    Past Medical History:  Diagnosis Date   Arthritis    whole body   Asthma    inhaler in summer   Back pain    Bell's palsy    12 yrs ago, right eye weaker   COPD (chronic obstructive pulmonary disease) (HCC)    wheezing   Depression    Diabetes mellitus, type 2 (HCC)    Dyspnea    Headache    about everyday   Hepatitis C    Hypertension    Kidney tumor (benign), left    Pneumonia    in past   Snake bite    as a child    Patient Active Problem List   Diagnosis Date Noted   Noncompliance with treatment regimen 09/14/2020   Tobacco use disorder 08/14/2020   MDD (major depressive disorder), recurrent episode, moderate ([place]) 07/06/2020   GAD (generalized anxiety disorder) 07/06/2020   RLS (restless legs syndrome) 07/06/2020   Headache  disorder 06/01/2020   Loss of memory 08/04/2019   Multiple fractures of ribs, bilateral, subsequent encounter for fracture with routine healing 08/10/2017    Past Surgical History:  Procedure Laterality Date   CATARACT EXTRACTION W/PHACO Left 04/19/2018   Procedure: CATARACT EXTRACTION PHACO AND INTRAOCULAR LENS PLACEMENT ([place]) LEFT IVA TOPICAL;  Surgeon: Leandrew Koyanagi, MD;  Location: [place];  Service: Ophthalmology;  Laterality: Left;   CATARACT EXTRACTION W/PHACO Right 05/19/2018   Procedure: CATARACT EXTRACTION PHACO AND INTRAOCULAR LENS PLACEMENT ([place])  RIGHT;  Surgeon: Leandrew Koyanagi, MD;  Location: [place];  Service: Ophthalmology;  Laterality: Right;   MICROLARYNGOSCOPY Right 01/13/2020   Procedure: MICROLARYNGOSCOPY WITH EXCISION OF VC LESION;  Surgeon: Beverly Gust, MD;  Location: [facility];  Service: ENT;  Laterality: Right;  Diabetic - oral meds   TUBAL LIGATION     TUMOR REMOVAL     benign tumor removed left kidney   XI ROBOTIC ASSISTED VENTRAL HERNIA N/A 06/13/2020   Procedure: XI ROBOTIC ASSISTED INCISIONAL HERNIA;  Surgeon: Herbert Pun, MD;  Location: [facility];  Service: General;  Laterality: N/A;    Prior to Admission medications   Medication Sig Start Date End Date Taking? Authorizing Provider  amoxicillin-clavulanate (AUGMENTIN)  875-125 MG tablet Take 1 tablet by mouth 2 (two) times daily for 7 days. 05/20/21 05/27/21 Yes Duffy Bruce, MD  benzonatate (TESSALON PERLES) 100 MG capsule Take 1 capsule (100 mg total) by mouth 3 (three) times daily as needed for cough. 05/20/21 05/20/22 Yes Duffy Bruce, MD  nicotine (NICODERM CQ - DOSED IN MG/24 HOURS) 21 mg/24hr patch Place 1 patch (21 mg total) onto the skin daily. 05/20/21 05/20/22 Yes Duffy Bruce, MD  predniSONE (DELTASONE) 20 MG tablet Take 3 tablets (60 mg total) by mouth daily for 5 days. 05/20/21 05/25/21 Yes Duffy Bruce, MD  acetaminophen (TYLENOL) 500 MG  tablet Take by mouth. Patient not taking: Reported on 09/14/2020    [provider]  albuterol (VENTOLIN HFA) 108 (90 Base) MCG/ACT inhaler Inhale 2 puffs into the lungs every 6 (six) hours as needed for wheezing or shortness of breath. 05/20/21   Duffy Bruce, MD  amitriptyline (ELAVIL) 100 MG tablet Take by mouth. Patient not taking: Reported on 09/14/2020 09/04/20   [provider]  Blood Glucose Monitoring Suppl (TRUE METRIX METER) DEVI SMARTSIG:1 Unit(s) Via Meter Daily 05/22/20   [provider]  Cyanocobalamin (VITAMIN B12 PO) Take 1 tablet by mouth daily.     [provider]  cyclobenzaprine (FLEXERIL) 5 MG tablet Take 5 mg by mouth 3 (three) times daily as needed for muscle spasms.     [provider]  DULERA 100-5 MCG/ACT AERO Inhale 2 puffs into the lungs 2 (two) times daily. 08/29/20   [provider]  EMGALITY 120 MG/ML SOAJ Inject 1 mL into the skin every 30 (thirty) days. 06/01/20   [provider]  Erenumab-aooe (AIMOVIG) 140 MG/ML SOAJ Inject into the skin.  06/07/20   [provider]  hydrOXYzine (VISTARIL) 50 MG capsule TAKE (1) CAPSULE BY MOUTH DAILY AT BEDTIME AS NEEDED FOR SLEEP AND ANXIETY. 10/29/20   Ursula Alert, MD  lamoTRIgine (LAMICTAL) 25 MG tablet Take 1 tablet (25 mg total) by mouth daily for 15 days, THEN 1 tablet (25 mg total) 2 (two) times daily for 15 days. 09/26/20 10/26/20  Ursula Alert, MD  meclizine (ANTIVERT) 25 MG tablet Take 25 mg by mouth 3 (three) times daily as needed for dizziness or nausea.  04/26/20   [provider]  meloxicam (MOBIC) 15 MG tablet Take 15 mg by mouth daily.    [provider]  metFORMIN (GLUCOPHAGE-XR) 500 MG 24 hr tablet Take 500 mg by mouth 2 (two) times daily. 05/30/20   [provider]  metoprolol tartrate (LOPRESSOR) 25 MG tablet Take 12.5 mg by mouth in the morning and at bedtime. 03/20/20   [provider]   mometasone-formoterol (DULERA) 100-5 MCG/ACT AERO Inhale into the lungs. 08/29/20 08/29/21  [provider]  omeprazole (PRILOSEC) 20 MG capsule Take 20 mg by mouth daily. 01/10/20   [provider]  Pharmacist Choice Lancets MISC Apply topically as needed.  01/17/20   [provider]  pravastatin (PRAVACHOL) 40 MG tablet Take 40 mg by mouth daily.     [provider]  rOPINIRole (REQUIP) 0.5 MG tablet TAKE (1) TABLET BY MOUTH DAILY AT BEDTIME FOR RESTLESS LEGS. 10/01/20   Ursula Alert, MD  TRUE METRIX BLOOD GLUCOSE TEST test strip SMARTSIG:1 Via Meter 4-5 Times Daily 06/26/20   [provider]  VITAMIN D PO Take 1 tablet by mouth daily.     [provider]    Allergies Buspar [buspirone] and Gabapentin  Family History  Problem Relation Age of Onset   Drug abuse Son     Social History Social History   Tobacco Use   Smoking status: Every Day    Packs/day: 1.00    Years: 30.00    Pack years: 30.00    Types: Cigarettes   Smokeless tobacco: Never   Tobacco comments:    Cutting back - reported on 07/06/20  Vaping Use   Vaping Use: Former   Substances: Nicotine   Devices: Vuse  Substance Use Topics   Alcohol use: No   Drug use: Not Currently    Types: Marijuana    Comment: last use one year ago - reported 07/06/20    Review of Systems  Review of Systems  Constitutional:  Positive for fatigue. Negative for fever.  HENT:  Negative for congestion and sore throat.   Eyes:  Negative for visual disturbance.  Respiratory:  Positive for shortness of breath and wheezing. Negative for cough.   Cardiovascular:  Negative for chest pain.  Gastrointestinal:  Negative for abdominal pain, diarrhea, nausea and vomiting.  Genitourinary:  Negative for flank pain.  Musculoskeletal:  Negative for back pain and neck pain.  Skin:  Negative for rash and wound.  Neurological:  Positive for weakness.  All other systems reviewed and are negative.    ____________________________________________  PHYSICAL EXAM:      VITAL SIGNS: ED Triage Vitals  Enc Vitals Group     BP 05/20/21 1231 105/85     Pulse Rate 05/20/21 1231 79     Resp 05/20/21 1231 16     Temp 05/20/21 1231 98.2 F (36.8 C)     Temp src --      SpO2 05/20/21 1231 99 %     Weight 05/20/21 1228 220 lb 0.3 oz (99.8 kg)     Height 05/20/21 1228 5\' 2"  (1.575 m)     Head Circumference --      Peak Flow --      Pain Score --      Pain Loc --      Pain Edu? --      Excl. in [place]? --      Physical Exam Vitals and nursing note reviewed.  Constitutional:      General: She is not in acute distress.    Appearance: She is well-developed.  HENT:     Head: Normocephalic and atraumatic.     Comments: Serous effusions bilaterally.  Small excoriation at the left external auditory canal with no evidence of edema, mass, or occlusion. Eyes:     Conjunctiva/sclera: Conjunctivae normal.  Cardiovascular:     Rate and Rhythm: Normal rate and regular rhythm.     Heart sounds: Normal heart sounds. No murmur heard.   No friction rub.  Pulmonary:     Effort: Pulmonary effort is normal. No respiratory distress.     Breath sounds: Examination of the right-middle field reveals wheezing. Examination of the left-middle field reveals wheezing. Examination of the right-lower field reveals wheezing. Examination of the left-lower field reveals wheezing. Wheezing present. No rales.  Abdominal:     General: There is no distension.     Palpations: Abdomen is soft.     Tenderness: There is no abdominal tenderness.  Musculoskeletal:     Cervical back: Neck supple.  Skin:    General: Skin is warm.     Capillary Refill: Capillary refill takes less than 2 seconds.  Neurological:     Mental Status: She is alert and  oriented to person, place, and time.     Motor: No abnormal muscle tone.      ____________________________________________   LABS (all labs ordered are listed, but only  abnormal results are displayed)  Labs Reviewed  BASIC METABOLIC PANEL - Abnormal; Notable for the following components:      Result Value   Potassium 5.4 (*)    Glucose, Bld 100 (*)    All other components within normal limits  RESP PANEL BY RT-PCR (FLU A&B, COVID) ARPGX2  CBC  POC URINE PREG, ED  TROPONIN I (HIGH SENSITIVITY)    ____________________________________________  EKG: Normal sinus rhythm, ventricular rate 71.  PR 190, QRS 86, QTc 410.  No acute ST elevations or depressions.  No EKG evidence of acute ischemia or infarct. ________________________________________  RADIOLOGY All imaging, including plain films, CT scans, and ultrasounds, independently reviewed by me, and interpretations confirmed via formal radiology reads.  ED MD interpretation:   Chest x-ray: Clear  Official radiology report(s): DG Chest 2 View  Result Date: 05/20/2021 CLINICAL DATA:  Cough, shortness of breath and sore throat with headache for 2 weeks. Chills and back pain. EXAM: CHEST - 2 VIEW COMPARISON:  May 25, 2019. FINDINGS: Trachea is midline. Cardiomediastinal contours and hilar structures are normal. Lungs are clear.  No pneumothorax. No pleural effusion. On limited assessment no acute skeletal process. IMPRESSION: No acute cardiopulmonary disease. Electronically Signed   By: Zetta Bills M.D.   On: 05/20/2021 13:07    ____________________________________________  PROCEDURES   Procedure(s) performed (including Critical Care):  Procedures  ____________________________________________  INITIAL IMPRESSION / MDM / [place] / ED COURSE  As part of my medical decision making, I reviewed the following data within the [facility] notes reviewed and incorporated, Old chart reviewed, Notes from prior ED visits, and [place] Controlled Substance Database       *SERINITY WARE was evaluated in Emergency Department on 05/20/2021 for the symptoms described in the  history of present illness. She was evaluated in the context of the global COVID-19 pandemic, which necessitated consideration that the patient might be at risk for infection with the SARS-CoV-2 virus that causes COVID-19. Institutional protocols and algorithms that pertain to the evaluation of patients at risk for COVID-19 are in a state of rapid change based on information released by regulatory bodies including the CDC and federal and state organizations. These policies and algorithms were followed during the patient's care in the ED.  Some ED evaluations and interventions may be delayed as a result of limited staffing during the pandemic.*     Medical Decision Making: Well-appearing 60 year old female here with cough, shortness of breath, occasional sputum production.  Suspect URI versus COPD exacerbation versus COVID.  Patient is satting well on room air.  She has a normal work of breathing.  Chest x-ray reviewed and is clear.  Lab work is otherwise reassuring.  EKG nonischemic with negative troponin and I do not suspect ACS or CHF clinically.  She has no evidence to suggest PE.  Patient given breathing treatment.  Will plan to treat for COPD exacerbation.  Will cover empirically with antibiotics.  Given her ear pressure and reported bleeding, will prescribe Augmentin as this will cover both respiratory as well as otitis, though I suspect the bleeding was due to excoriation from her Q-tip.  No evidence of mastoiditis.  No evidence of meningitis.  Otherwise, patient feels better after breathing treatments and is satting well on room air,  amatory without difficulty.  Will discharge on steroids, antibiotics.  Of note, I had a long discussion regarding nicotine cessation with her.  She does state a desire to stop smoking.  She has tolerated patches well in the past.  Will give her patches to assist with this, and encouraged PCP follow-up for further  management.  ____________________________________________  FINAL CLINICAL IMPRESSION(S) / ED DIAGNOSES  Final diagnoses:  Bronchitis  Wheezing     MEDICATIONS GIVEN DURING THIS VISIT:  Medications  ipratropium-albuterol (DUONEB) 0.5-2.5 (3) MG/3ML nebulizer solution 3 mL (3 mLs Nebulization Given 05/20/21 1505)  predniSONE (DELTASONE) tablet 60 mg (60 mg Oral Given 05/20/21 1504)     ED Discharge Orders          Ordered    amoxicillin-clavulanate (AUGMENTIN) 875-125 MG tablet  2 times daily        05/20/21 1516    predniSONE (DELTASONE) 20 MG tablet  Daily        05/20/21 1516    benzonatate (TESSALON PERLES) 100 MG capsule  3 times daily PRN        05/20/21 1516    albuterol (VENTOLIN HFA) 108 (90 Base) MCG/ACT inhaler  Every 6 hours PRN        05/20/21 1516    nicotine (NICODERM CQ - DOSED IN MG/24 HOURS) 21 mg/24hr patch  Every 24 hours        05/20/21 1518             Note:  This document was prepared using Dragon voice recognition software and may include unintentional dictation errors.   Duffy Bruce, MD 05/20/21 [phone]

## 2021-05-20 NOTE — ED Notes (Signed)
See triage note, pt reports generalized shob for awhile, hx copd, asthma, smoker. nad noted, rr even and unlabored, speaking in complete sentences.

## 2021-06-19 ENCOUNTER — Other Ambulatory Visit: Payer: Self-pay | Admitting: Internal Medicine

## 2021-06-19 DIAGNOSIS — M5442 Lumbago with sciatica, left side: Secondary | ICD-10-CM

## 2021-06-19 DIAGNOSIS — G8929 Other chronic pain: Secondary | ICD-10-CM

## 2021-06-26 ENCOUNTER — Ambulatory Visit
Admission: RE | Admit: 2021-06-26 | Discharge: 2021-06-26 | Disposition: A | Discharge status: Home / Self Care | Payer: Medicare Other | Source: Ambulatory Visit | Attending: Internal Medicine | Admitting: Internal Medicine

## 2021-06-26 ENCOUNTER — Other Ambulatory Visit: Payer: Self-pay

## 2021-06-26 DIAGNOSIS — G8929 Other chronic pain: Secondary | ICD-10-CM | POA: Diagnosis present

## 2021-06-26 DIAGNOSIS — M5442 Lumbago with sciatica, left side: Secondary | ICD-10-CM | POA: Diagnosis present

## 2021-06-26 DIAGNOSIS — M5441 Lumbago with sciatica, right side: Secondary | ICD-10-CM | POA: Insufficient documentation

## 2021-06-26 IMAGING — MR MR LUMBAR SPINE W/O CM
4 of 5 series · 33 of 48 positions shown · non-contrast
Comparison: No prior MRI, correlation is made with CT abdomen
pelvis [DATE]

CLINICAL DATA: Low back pain with pain in the right buttock and leg
pain

EXAM:
MRI LUMBAR SPINE WITHOUT CONTRAST
TECHNIQUE: Multiplanar, multisequence MR imaging of the lumbar spine was
performed. No intravenous contrast was administered.

[Series 6: T1 · sagittal · 4.0mm · 0.81mm/px · 8 of 15 slices shown (1 of 2)]
[im 1/15]
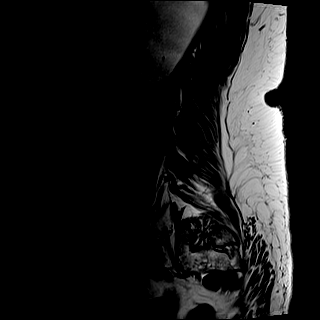
[im 3/15]
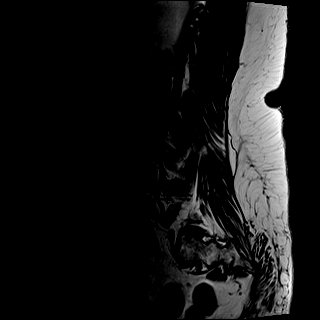
[im 5/15]
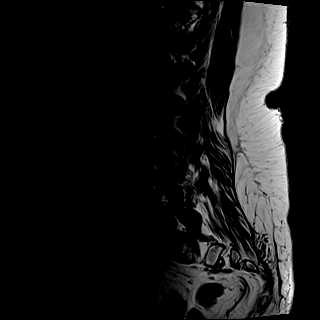
[im 7/15]
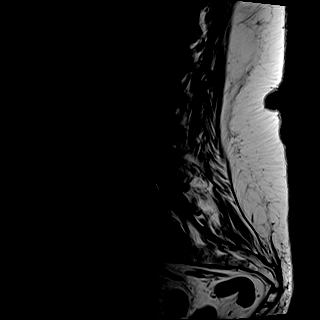
[im 9/15]
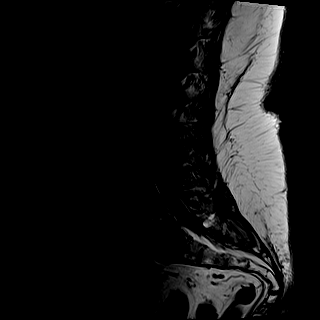
[im 11/15]
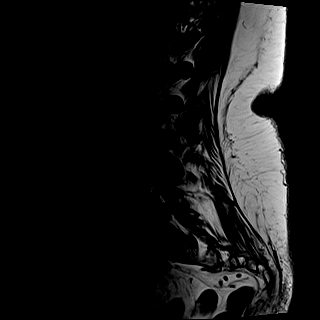
[im 13/15]
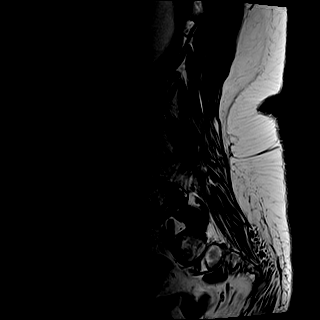
[im 15/15]
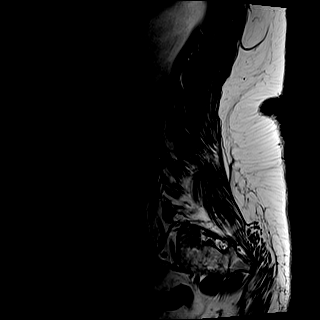

[Series 8: T2 · axial · 4.0mm · 0.78mm/px · z∈[-84,+77]mm · 9 of 26 slices shown (1 of 2)]
[im 1/26]
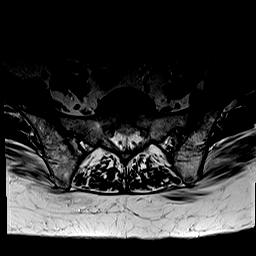
[im 5/26]
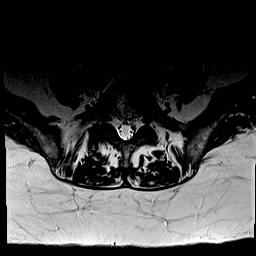
[im 9/26]
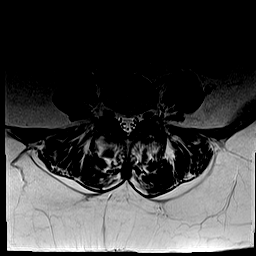
[im 11/26]
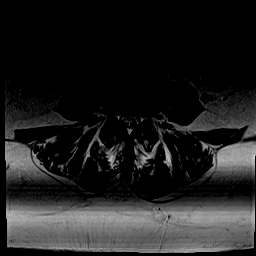
[im 13/26]
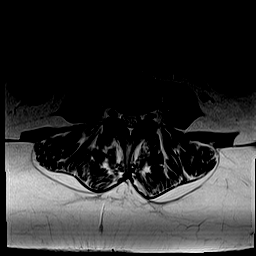
[im 15/26]
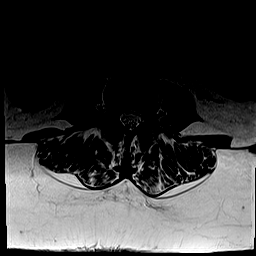
[im 17/26]
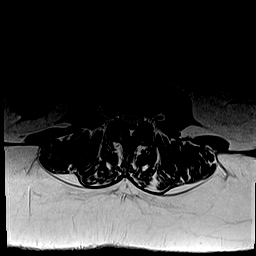
[im 21/26]
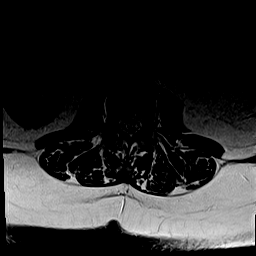
[im 26/26]
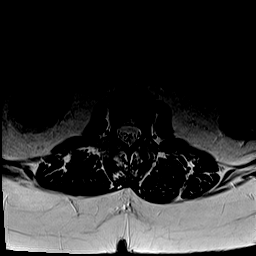

[Series 9: T1 · axial · 4.0mm · 0.39mm/px · z∈[-84,+77]mm · 9 of 26 slices shown (2 of 2)]
[im 1/26]
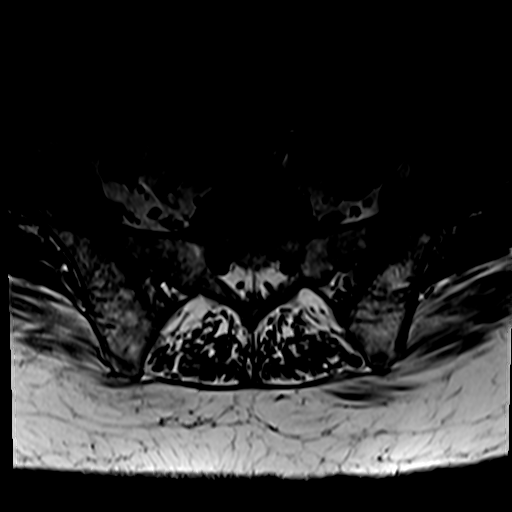
[im 5/26]
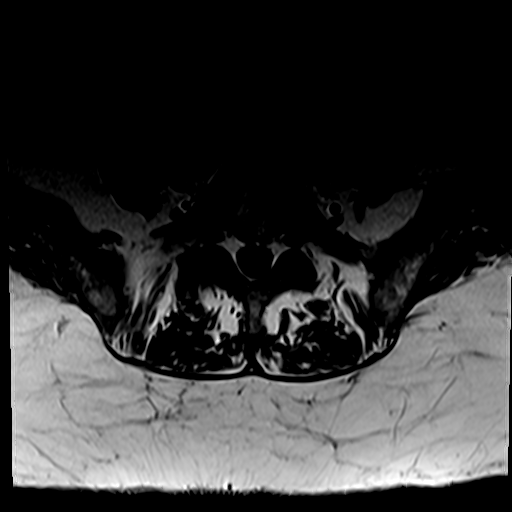
[im 9/26]
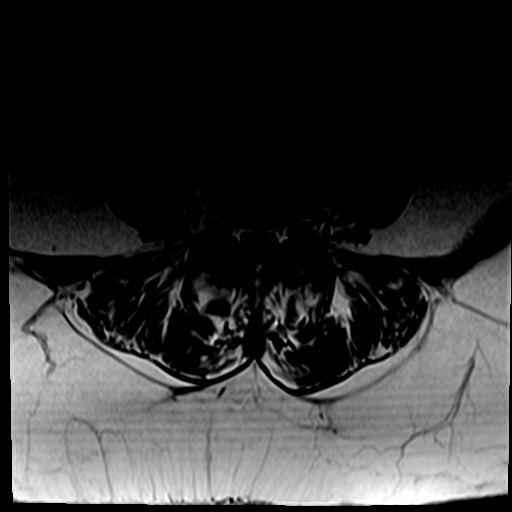
[im 11/26]
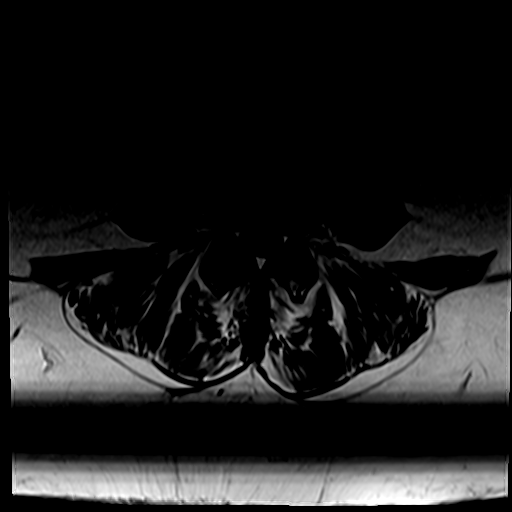
[im 13/26]
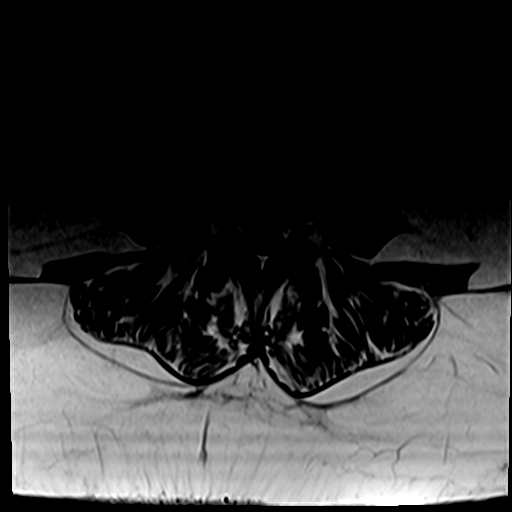
[im 15/26]
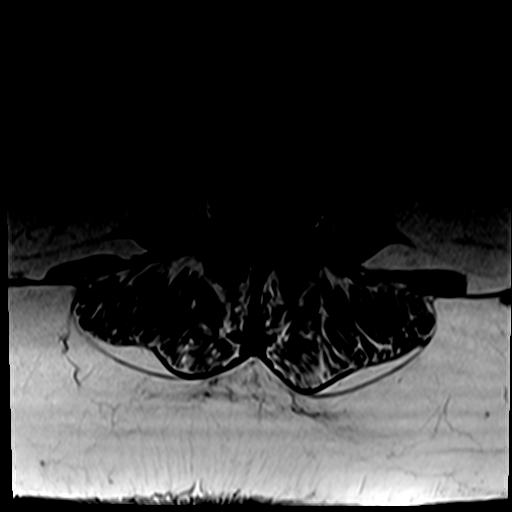
[im 17/26]
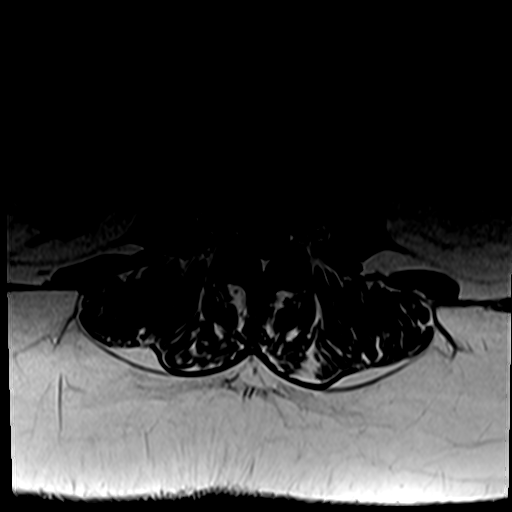
[im 21/26]
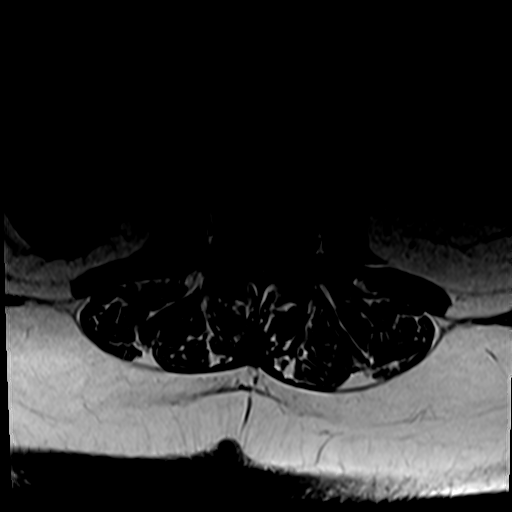
[im 26/26]
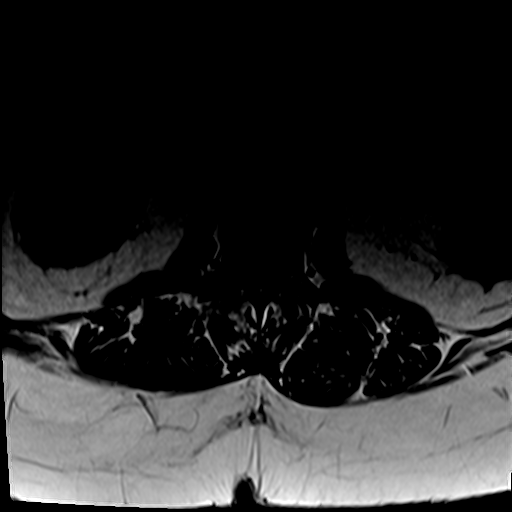

[Series 10: T2 · sagittal · 4.0mm · 0.81mm/px · 7 of 15 slices shown (2 of 2)]
[im 1/15]
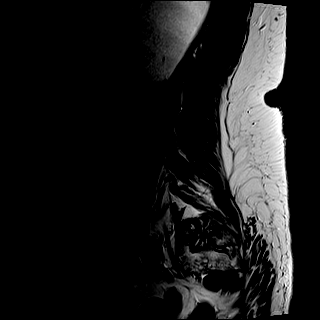
[im 3/15]
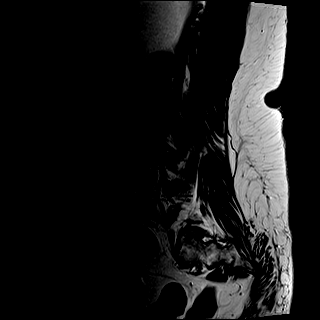
[im 5/15]
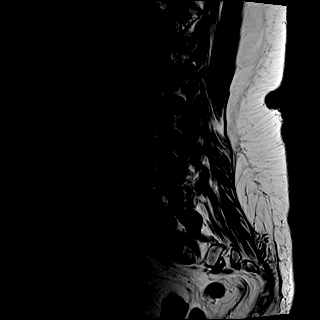
[im 8/15]
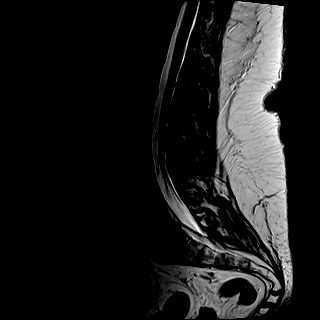
[im 10/15]
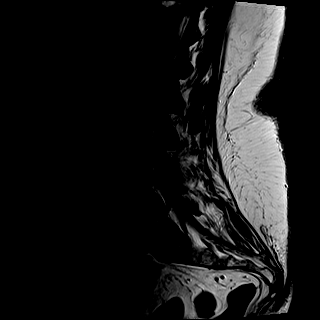
[im 12/15]
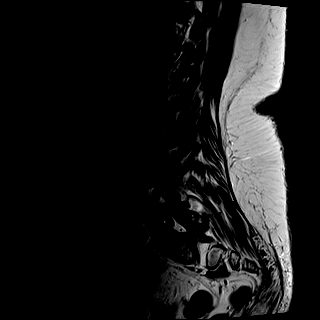
[im 15/15]
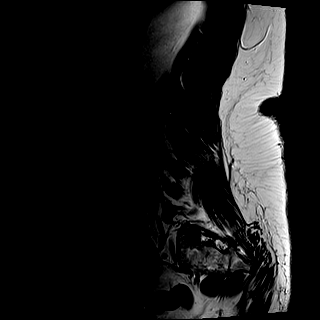

[33 of 48 positions shown; findings below may reference images not displayed]

FINDINGS: Segmentation:  Standard.

Alignment:  Physiologic.

Vertebrae: No fracture, evidence of discitis, or suspicious bone
lesion. T1-T2 hyperintense focus in the L4 and L5 vertebral bodies,
consistent with benign hemangiomas. Additional partially visualized
hemangioma in T11

Conus medullaris and cauda equina: Conus extends to the L1 level.
Conus and cauda equina appear normal.

Paraspinal and other soft tissues: Negative.

Disc levels:

T12-L1: Seen only on the sagittal images.  No significant stenosis.

L1-L2: No significant disc bulge. No spinal canal stenosis or neural
foraminal narrowing.

L2-L3: Mild disc desiccation. Minimal disc bulge. No spinal canal
stenosis or neural foraminal narrowing.

L3-L4: Mild disc desiccation. No significant disc bulge. Moderate
facet arthropathy, with ligamentum flavum hypertrophy. No spinal
canal stenosis. No neural foraminal narrowing.

L4-L5: Mild disc desiccation. Minimal disc bulge. Moderate facet
arthropathy. No spinal canal stenosis. Mild to moderate right neural
foraminal narrowing.

L5-S1: Small left subarticular disc protrusion. Severe right and
moderate left facet arthropathy. No spinal canal stenosis or neural
foraminal narrowing.
IMPRESSION: 1. L4-L5 mild to moderate right neural foraminal narrowing.
2. Multilevel facet arthropathy, which is worst on the right at
L5-S1.

## 2021-07-04 ENCOUNTER — Other Ambulatory Visit: Payer: Self-pay | Admitting: *Deleted

## 2021-07-04 DIAGNOSIS — F17210 Nicotine dependence, cigarettes, uncomplicated: Secondary | ICD-10-CM

## 2021-07-04 DIAGNOSIS — Z87891 Personal history of nicotine dependence: Secondary | ICD-10-CM

## 2021-07-25 ENCOUNTER — Encounter: Payer: Self-pay | Admitting: Pulmonary Disease

## 2021-07-25 ENCOUNTER — Telehealth (INDEPENDENT_AMBULATORY_CARE_PROVIDER_SITE_OTHER): Payer: Medicare Other | Admitting: Pulmonary Disease

## 2021-07-25 ENCOUNTER — Ambulatory Visit: Payer: Medicare Other

## 2021-07-25 DIAGNOSIS — F17210 Nicotine dependence, cigarettes, uncomplicated: Secondary | ICD-10-CM

## 2021-07-25 NOTE — Progress Notes (Signed)
Virtual Visit via Telephone Note  I connected with Sally Hughes on 07/25/21 at 10:00 AM EDT by telephone and verified that I am speaking with the correct person using two identifiers.  Location: Patient: Home  Provider: [place]    I discussed the limitations, risks, security and privacy concerns of performing an evaluation and management service by telephone and the availability of in person appointments. I also discussed with the patient that there may be a patient responsible charge related to this service. The patient expressed understanding and agreed to proceed.   Shared Decision Making Visit Lung Cancer Screening Program [id number])   Eligibility: Age 60 y.o. Pack Years Smoking History Calculation 100 pack year history (# packs/per year x # years smoked) Recent History of coughing up blood  no Unexplained weight loss? no ( >Than 15 pounds within the last 6 months ) Prior History Lung / other cancer no (Diagnosis within the last 5 years already requiring surveillance chest CT Scans). Smoking Status Current Smoker   Visit Components: Discussion included one or more decision making aids. Yes Discussion included risk/benefits of screening. yes Discussion included potential follow up diagnostic testing for abnormal scans. yes Discussion included meaning and risk of over diagnosis. yes Discussion included meaning and risk of False Positives. yes Discussion included meaning of total radiation exposure. yes  Counseling Included: Importance of adherence to annual lung cancer LDCT screening. yes Impact of comorbidities on ability to participate in the program. yes Ability and willingness to under diagnostic treatment. yes  Smoking Cessation Counseling: Current Smokers:  Discussed importance of smoking cessation. yes Information about tobacco cessation classes and interventions provided to patient. yes Patient provided with "ticket" for  LDCT Scan. yes Symptomatic Patient. yes  Counseling(Intensive: > 10 minutes counseling) [id number] Currently using '21mg'$  nicotene patch, doesn't like the gum, suggested '4mg'$  nicotene lozenge  Diagnosis Code: Tobacco Use Z72.0 Asymptomatic Patient no  Counseling (Intesive counseling: > 10 minutes counseling) [id number] Information about tobacco cessation classes and interventions provided to patient. Refused Patient provided with "ticket" for LDCT Scan. yes Written Order for Lung Cancer Screening with LDCT placed in Epic. Yes (CT Chest Lung Cancer Screening Low Dose W/O CM) [id number] Z12.2-Screening of respiratory organs Z87.891-Personal history of nicotine dependence   Lauraine Rinne, NP

## 2021-07-25 NOTE — Patient Instructions (Addendum)
Thank you for participating in the [facility]. It was our pleasure to meet you today. We will call you with the results of your scan within the next few days. Your scan will be assigned a Lung RADS category score by the physicians reading the scans.  This Lung RADS score determines follow up scanning.  See below for description of categories, and follow up screening recommendations. We will be in touch to schedule your follow up screening annually or based on recommendations of our providers. We will fax a copy of your scan results to your Primary Care Physician, or the physician who referred you to the program, to ensure they have the results. Please call the office if you have any questions or concerns regarding your scanning experience or results.  Our office number is [phone]. Please speak with Doroteo Glassman, RN. She is our Lung Cancer Lobbyist. If she is unavailable when you call, please have the office staff send her a message. She will return your call at her earliest convenience. Remember, if your scan is normal, we will scan you annually as long as you continue to meet the criteria for the program. (Age 65-77, Current smoker or smoker who has quit within the last 15 years). If you are a smoker, remember, quitting is the single most powerful action that you can take to decrease your risk of lung cancer and other pulmonary, breathing related problems. We know quitting is hard, and we are here to help.  Please let us know if there is anything we can do to help you meet your goal of quitting. 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 We are proud of you! Remain smoke free! Remember you can refer friends or family members through the number above.  We will screen them to make sure they meet criteria for the program. Thank you for helping [place] take better care of you by participating in Lung Screening.  Lung RADS Categories:  Lung RADS 1: no nodules  or definitely non-concerning nodules.  Recommendation is for a repeat annual scan in 12 months.  Lung RADS 2:  nodules that are non-concerning in appearance and behavior with a very low likelihood of becoming an active cancer. Recommendation is for a repeat annual scan in 12 months.  Lung RADS 3: nodules that are probably non-concerning , includes nodules with a low likelihood of becoming an active cancer.  Recommendation is for a 33-monthrepeat screening scan. Often noted after an upper respiratory illness. We will be in touch to make sure you have no questions, and to schedule your 68-monthcan.  Lung RADS 4 A: nodules with concerning findings, recommendation is most often for a follow up scan in 3 months or additional testing based on our provider's assessment of the scan. We will be in touch to make sure you have no questions and to schedule the recommended 3 month follow up scan.  Lung RADS 4 B:  indicates findings that are concerning. We will be in touch with you to schedule additional diagnostic testing based on our provider's  assessment of the scan.      We recommend that you stop smoking.  >>>You need to set a quit date >>>If you have friends or family who smoke, let them know you are trying to quit and not to smoke around you or in your living environment  Smoking Cessation Resources:  1 800 QUIT NOW  >>> Patient to call this resource and utilize it  to help support her quit smoking >>> Keep up your hard work with stopping smoking  You can also contact the [facility] >>>For smoking cessation classes call [phone]  We do not recommend using e-cigarettes as a form of stopping smoking  You can sign up for smoking cessation support texts and information:  >>>https://smokefree.gov/smokefreetxt   Nicotine patches: >>>Make sure you rotate sites that you do not get skin irritation, Apply 1 patch each morning to a non-hairy skin site  If you are smoking greater  than 10 cigarettes/day and weigh over 45 kg start with the nicotine patch of 21 mg a day for 6 weeks, then 14 mg a day for 2 weeks, then finished with 7 mg a day for 2 weeks, then stop  >>>If insomnia occurs you are having trouble sleeping you can take the patch off at night, and place a new one on in the morning >>>If the patch is removed at night and you have morning cravings start short acting nicotine replacement therapy such as gum or lozenges  Nicotine lozenge: Lozenges are commonly uses short acting NRT product  >>>Smokers who smoke within 30 minutes of awakening should use 4 mg dose >>>Smokers who wait more than 30 minutes after awakening to smoke should use 2 mg dose  Can use up to 1 lozenge every 1-2 hours for 6 weeks >>>Total amount of lozenges that can be used per day as 20 >>>Gradually reduce number of lozenges used per day after 2 weeks of use  Place lozenge in mouth and allowed to dissolve for 30 minutes loss and does not need to be chewed  Lozenges have advantages to be able to be used in people with TMG, poor dentition, dentures     Wyn Quaker NP

## 2021-07-29 ENCOUNTER — Other Ambulatory Visit: Payer: Self-pay

## 2021-07-29 ENCOUNTER — Ambulatory Visit
Admission: RE | Admit: 2021-07-29 | Discharge: 2021-07-29 | Disposition: A | Discharge status: Home / Self Care | Payer: Medicare Other | Source: Ambulatory Visit | Attending: Acute Care | Admitting: Acute Care

## 2021-07-29 ENCOUNTER — Ambulatory Visit: Admission: RE | Admit: 2021-07-29 | Payer: Medicare Other | Source: Ambulatory Visit

## 2021-07-29 DIAGNOSIS — Z87891 Personal history of nicotine dependence: Secondary | ICD-10-CM | POA: Diagnosis present

## 2021-07-29 DIAGNOSIS — F17210 Nicotine dependence, cigarettes, uncomplicated: Secondary | ICD-10-CM | POA: Diagnosis not present

## 2021-07-29 IMAGING — CT CT CHEST LUNG CANCER SCREENING LOW DOSE W/O CM
2 of 5 series · 15 of 40 positions shown, 18 images · non-contrast
Comparison: None.

CLINICAL DATA: Current smoker with 100 pack-year history

EXAM:
CT CHEST WITHOUT CONTRAST LOW-DOSE FOR LUNG CANCER SCREENING
TECHNIQUE: Multidetector CT imaging of the chest was performed following the
standard protocol without IV contrast.

[Series 3: lung 1.00 · axial · 0.69mm/px · z∈[-1178,-916]mm · 12 of 290 slices shown, 15 images]
[im 14/290  mediastinal]
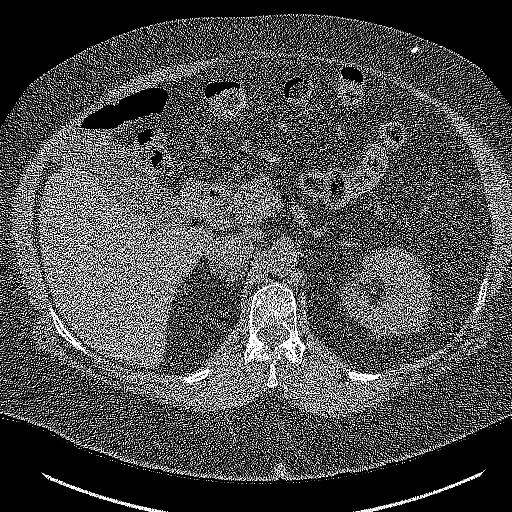
[im 14/290  lung]
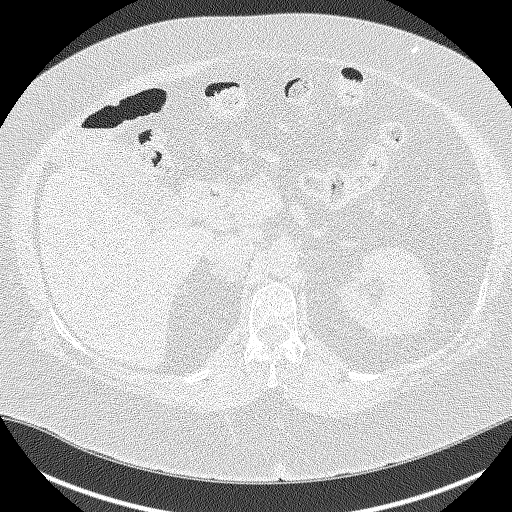
[im 40/290  lung]
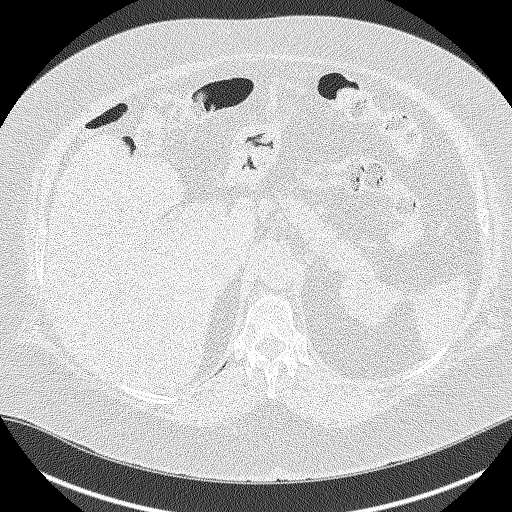
[im 66/290  lung]
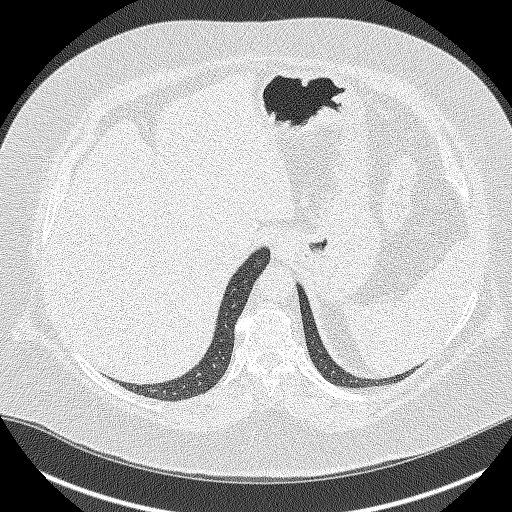
[im 92/290  lung]
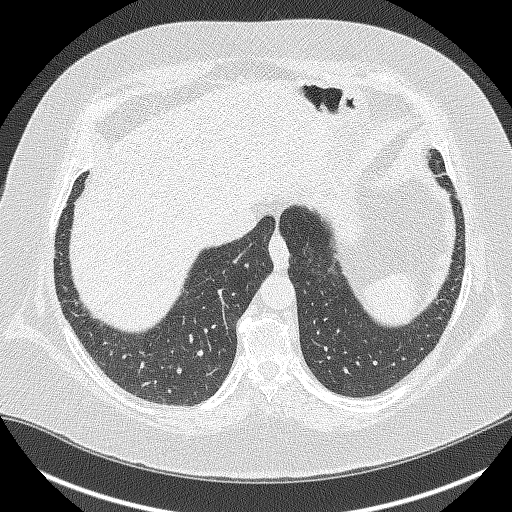
[im 106/290  mediastinal]
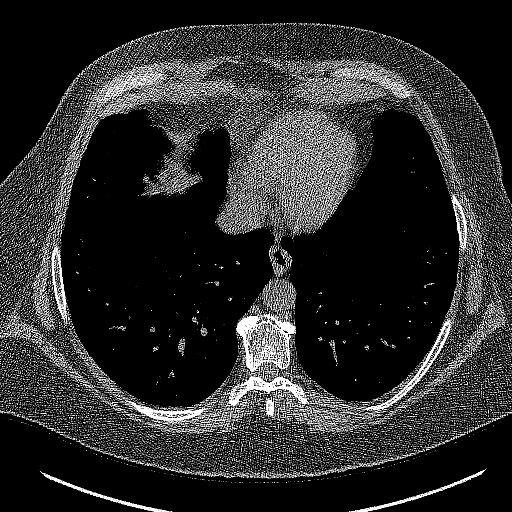
[im 106/290  lung]
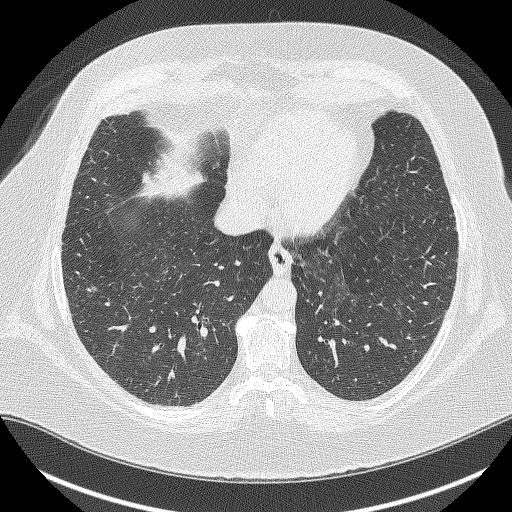
[im 132/290  lung]
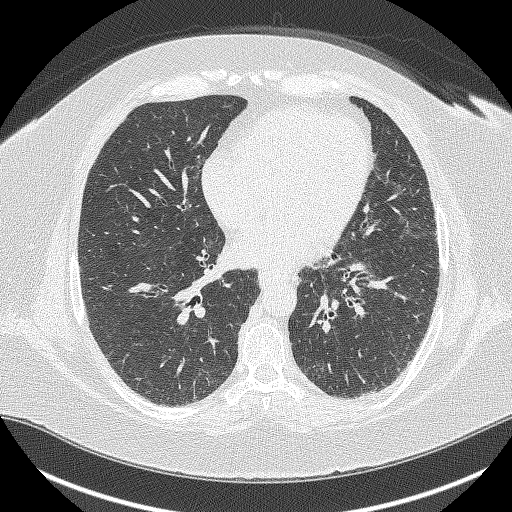
[im 158/290  lung]
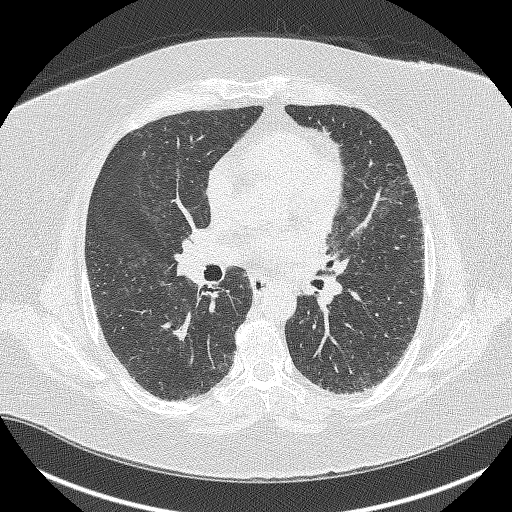
[im 184/290  lung]
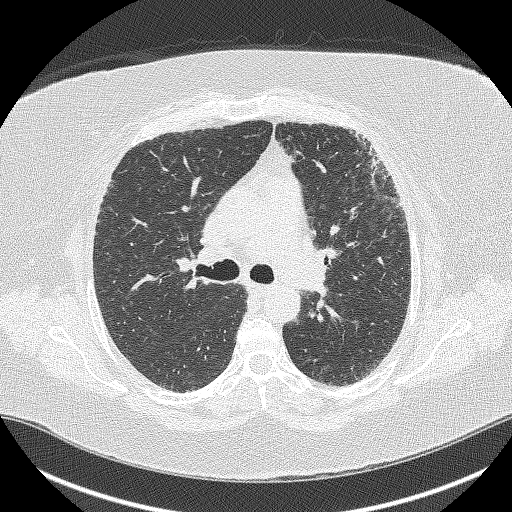
[im 198/290  mediastinal]
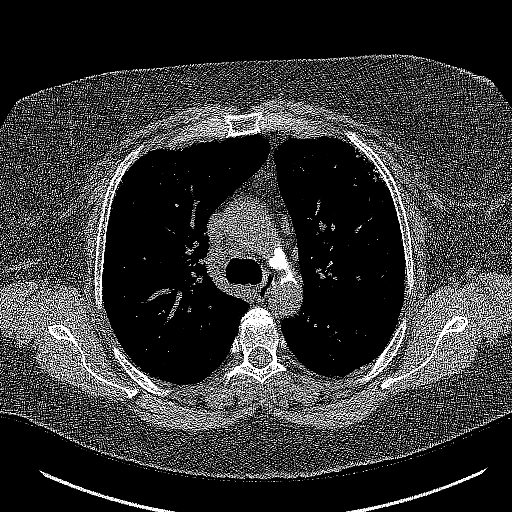
[im 198/290  lung]
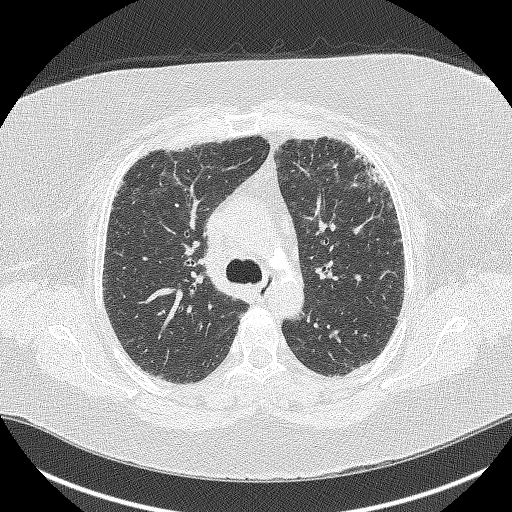
[im 224/290  lung]
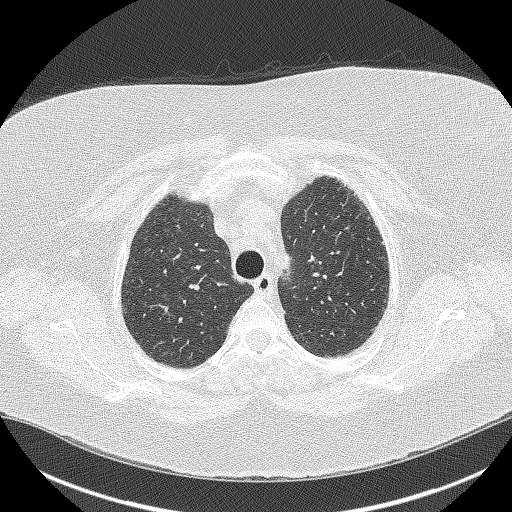
[im 250/290  lung]
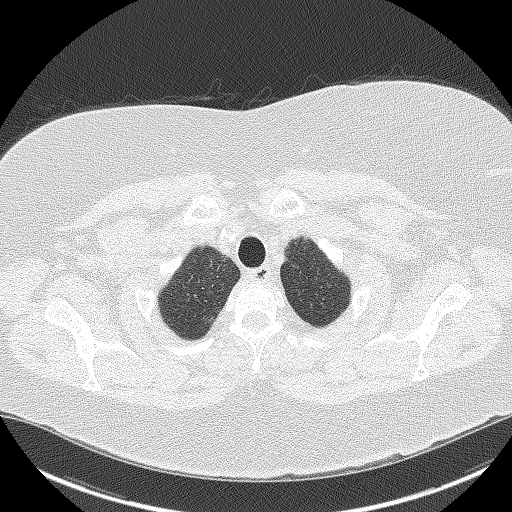
[im 276/290  lung]
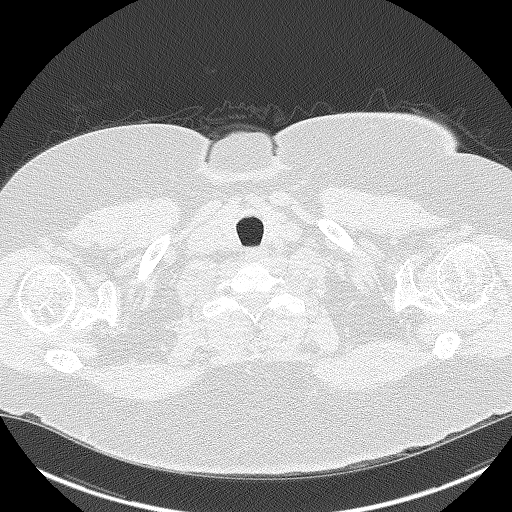

[Series 5: coronals lung 1.00 cor · coronal · 0.57mm/px · 3 of 319 slices shown]
[im 64/319  lung]
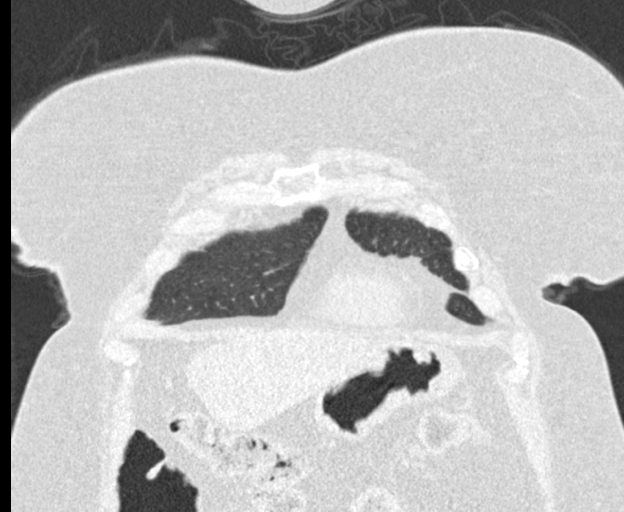
[im 128/319  lung]
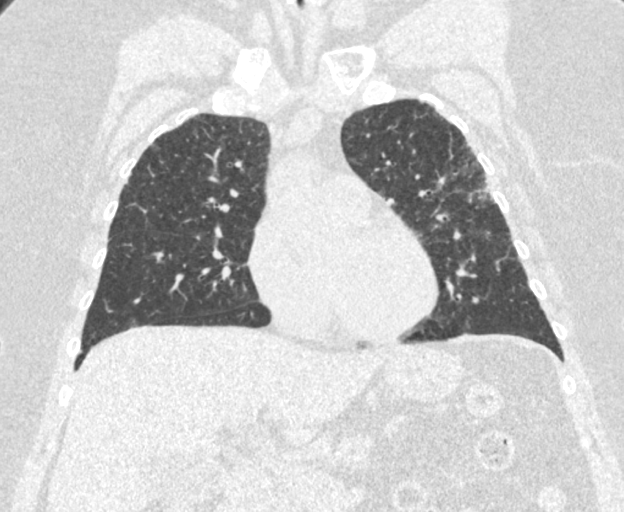
[im 191/319  lung]
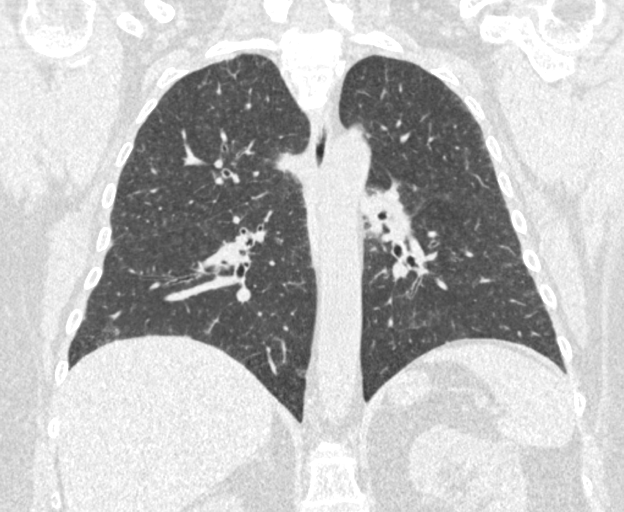

[15 of 40 positions shown; findings below may reference images not displayed]

FINDINGS: Cardiovascular: Normal heart size. No pericardial effusion.
Calcifications of the circumflex and LAD. Aortic atherosclerotic
calcifications.

Mediastinum/Nodes: Esophagus is unremarkable. No pathologically
enlarged lymph nodes seen in the chest.

Lungs/Pleura: Debris seen in the trachea. Central airways are
patent. Mild centrilobular emphysema. Ill-defined upper lobe
predominant ground-glass nodules. Mild upper lobe predominant
subpleural reticular opacities. Small scattered 2 mm pulmonary
nodules, reference nodule located on image 170, series 3.

Upper Abdomen: Low-attenuation nodule of the right adrenal gland
measuring 2.0 cm, likely a benign adenoma no acute findings.

Musculoskeletal: No chest wall mass or suspicious bone lesions
identified.
IMPRESSION: Lung-RADS 2S, benign appearance or behavior. Continue annual
screening with low-dose chest CT without contrast in 12 months. S
modifier for respiratory bronchiolitis interstitial lung disease.

Ill-defined centrilobular ground-glass nodules primarily in the
upper lungs and upper lobe predominant subpleural reticular
opacities, findings are compatible with smoking related respiratory
bronchiolitis interstitial lung disease.

Aortic Atherosclerosis ([QW]-[QW]) and Emphysema ([QW]-[QW]).

## 2021-08-20 NOTE — Progress Notes (Signed)
Please call patient and let them  know their  low dose Ct was read as a Lung RADS 2: nodules that are benign in appearance and behavior with a very low likelihood of becoming a clinically active cancer due to size or lack of growth. Recommendation per radiology is for a repeat LDCT in 12 months. .Please let them  know we will order and schedule their  annual screening scan for 07/2022. Please let them  know there was notation of CAD on their  scan.  Please remind the patient  that this is a non-gated exam therefore degree or severity of disease  cannot be determined. Please have them  follow up with their PCP regarding potential risk factor modification, dietary therapy or pharmacologic therapy if clinically indicated. Pt.  is  currently on statin therapy. Please place order for annual  screening scan for  07/2022 and fax results to PCP. Thanks so much.  Please let her know about the notation of ILD. Tell her tis is scarring from her smoking, and that it can be progressive. Ask her if she would like a referal to see Dr. Chase Caller or [place] to have this followed. I would recommend this.  Thanks so much

## 2021-08-29 ENCOUNTER — Other Ambulatory Visit: Payer: Self-pay | Admitting: *Deleted

## 2021-08-29 DIAGNOSIS — F17210 Nicotine dependence, cigarettes, uncomplicated: Secondary | ICD-10-CM

## 2021-08-29 DIAGNOSIS — Z87891 Personal history of nicotine dependence: Secondary | ICD-10-CM

## 2021-09-09 ENCOUNTER — Encounter: Payer: Self-pay | Admitting: Pulmonary Disease

## 2021-09-09 ENCOUNTER — Ambulatory Visit (INDEPENDENT_AMBULATORY_CARE_PROVIDER_SITE_OTHER): Payer: Medicare Other | Admitting: Pulmonary Disease

## 2021-09-09 ENCOUNTER — Other Ambulatory Visit: Payer: Self-pay

## 2021-09-09 VITALS — BP 130/80 | HR 89 | Temp 97.1°F | Ht 62.0 in | Wt 203.8 lb

## 2021-09-09 DIAGNOSIS — J449 Chronic obstructive pulmonary disease, unspecified: Secondary | ICD-10-CM

## 2021-09-09 DIAGNOSIS — F17210 Nicotine dependence, cigarettes, uncomplicated: Secondary | ICD-10-CM | POA: Diagnosis not present

## 2021-09-09 DIAGNOSIS — R0602 Shortness of breath: Secondary | ICD-10-CM

## 2021-09-09 DIAGNOSIS — J84115 Respiratory bronchiolitis interstitial lung disease: Secondary | ICD-10-CM

## 2021-09-09 NOTE — Progress Notes (Signed)
Subjective:    Patient ID: Sally Hughes, female    DOB: 02/19/1961, 60 y.o.   MRN: [id number] Chief Complaint  Patient presents with   Consult   HPI The patient is a 60 year old current smoker 3 PPD max, now 1 PPD for 100 PY) presents for evaluation of abnormal lung cancer screening CT with regards to respiratory bronchiolitis associated ILD.  Patient is kindly referred by Dr. Gladstone Lighter.  Also notes shortness of breath.  She has had shortness of breath for a number of years.  She has been smoking for over 30 years at maximum 3 packs/day now has decreased to 1 pack/day.  All in all she has approximately 100-pack-year history of smoking.  She is not using inhalers regularly.  She states that she does not think they help though she has never really given her inhalers an adequate trial.  Her [facility] records indicate that she is on Trelegy Ellipta however, she states that she is actually on [name] but is not using the inhaler.  She does not have any hemoptysis or purulent sputum production.  No fevers, chills or sweats.  She is for the most part sedentary.  She has not had foreign travel.  No military history.  No significant occupational exposure.  Filed for disability in 2016 which generated PFTs at that time results of which are noted below.  Patient does not endorse any other symptomatology.   DATA: 04/10/2015 PFTs: FEV1 1.62 L or 72% predicted, FVC 2.59 L or 86% predicted, FEV1/FVC 63%.  There is significant bronchodilator response with FEV1 normalizing to 93% for a total of 29% net change.  Findings are consistent with mild COPD with significant bronchodilator response. 07/29/2021 LDCT:Lung-RADS 2S, benign appearance or behavior. Continue annual screening with low-dose chest CT without contrast in 12 months. 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  Review of Systems A 10 point review of systems was performed and it is as noted above otherwise  negative.  Past Medical History:  Diagnosis Date   Arthritis    whole body   Asthma    inhaler in summer   Back pain    Bell's palsy    12 yrs ago, right eye weaker   COPD (chronic obstructive pulmonary disease) (HCC)    wheezing   Depression    Diabetes mellitus, type 2 (HCC)    Dyspnea    Headache    about everyday   Hepatitis C    Hypertension    Kidney tumor (benign), left    Pneumonia    in past   Snake bite    as a child   Past Surgical History:  Procedure Laterality Date   CATARACT EXTRACTION W/PHACO Left 04/19/2018   Procedure: CATARACT EXTRACTION PHACO AND INTRAOCULAR LENS PLACEMENT ([place]) LEFT IVA TOPICAL;  Surgeon: Leandrew Koyanagi, MD;  Location: [facility];  Service: Ophthalmology;  Laterality: Left;   CATARACT EXTRACTION W/PHACO Right 05/19/2018   Procedure: CATARACT EXTRACTION PHACO AND INTRAOCULAR LENS PLACEMENT ([place])  RIGHT;  Surgeon: Leandrew Koyanagi, MD;  Location: [facility];  Service: Ophthalmology;  Laterality: Right;   MICROLARYNGOSCOPY Right 01/13/2020   Procedure: MICROLARYNGOSCOPY WITH EXCISION OF VC LESION;  Surgeon: Beverly Gust, MD;  Location: [facility];  Service: ENT;  Laterality: Right;  Diabetic - oral meds   TUBAL LIGATION     TUMOR REMOVAL     benign tumor removed left kidney   XI ROBOTIC ASSISTED VENTRAL HERNIA N/A 06/13/2020   Procedure:  XI ROBOTIC ASSISTED INCISIONAL HERNIA;  Surgeon: Herbert Pun, MD;  Location: [facility];  Service: General;  Laterality: N/A;   Family History  Problem Relation Age of Onset   Drug abuse Son    Social History   Tobacco Use   Smoking status: Every Day    Packs/day: 1.00    Years: 30.00    Pack years: 30.00    Types: Cigarettes   Smokeless tobacco: Never   Tobacco comments:    Cutting back - reported on 07/06/20  Substance Use Topics   Alcohol use: No   Allergies  Allergen Reactions   Buspirone     confused Other reaction(s): Other (See  Comments) confused   Gabapentin Hives and Other (See Comments)    Involuntary jerking and hives   Current Meds  Medication Sig   acetaminophen (TYLENOL) 500 MG tablet Take by mouth.   albuterol (VENTOLIN HFA) 108 (90 Base) MCG/ACT inhaler Inhale 2 puffs into the lungs every 6 (six) hours as needed for wheezing or shortness of breath.   amitriptyline (ELAVIL) 100 MG tablet Take by mouth.   benzonatate (TESSALON PERLES) 100 MG capsule Take 1 capsule (100 mg total) by mouth 3 (three) times daily as needed for cough.   Blood Glucose Monitoring Suppl (TRUE METRIX METER) DEVI SMARTSIG:1 Unit(s) Via Meter Daily   Cyanocobalamin (VITAMIN B12 PO) Take 1 tablet by mouth daily.    cyclobenzaprine (FLEXERIL) 5 MG tablet Take 5 mg by mouth 3 (three) times daily as needed for muscle spasms.    DULERA 100-5 MCG/ACT AERO Inhale 2 puffs into the lungs 2 (two) times daily.   EMGALITY 120 MG/ML SOAJ Inject 1 mL into the skin every 30 (thirty) days.   Erenumab-aooe (AIMOVIG) 140 MG/ML SOAJ Inject into the skin.    hydrOXYzine (VISTARIL) 50 MG capsule TAKE (1) CAPSULE BY MOUTH DAILY AT BEDTIME AS NEEDED FOR SLEEP AND ANXIETY.   lamoTRIgine (LAMICTAL) 25 MG tablet Take 1 tablet (25 mg total) by mouth daily for 15 days, THEN 1 tablet (25 mg total) 2 (two) times daily for 15 days.   meclizine (ANTIVERT) 25 MG tablet Take 25 mg by mouth 3 (three) times daily as needed for dizziness or nausea.    meloxicam (MOBIC) 15 MG tablet Take 15 mg by mouth daily.   metFORMIN (GLUCOPHAGE-XR) 500 MG 24 hr tablet Take 500 mg by mouth 2 (two) times daily.   metoprolol tartrate (LOPRESSOR) 25 MG tablet Take 12.5 mg by mouth in the morning and at bedtime.   mometasone-formoterol (DULERA) 100-5 MCG/ACT AERO Inhale into the lungs.   nicotine (NICODERM CQ - DOSED IN MG/24 HOURS) 21 mg/24hr patch Place 1 patch (21 mg total) onto the skin daily.   omeprazole (PRILOSEC) 20 MG capsule Take 20 mg by mouth daily.   Pharmacist Choice Lancets  MISC Apply topically as needed.    pravastatin (PRAVACHOL) 40 MG tablet Take 40 mg by mouth daily.    rOPINIRole (REQUIP) 0.5 MG tablet TAKE (1) TABLET BY MOUTH DAILY AT BEDTIME FOR RESTLESS LEGS.   TRUE METRIX BLOOD GLUCOSE TEST test strip SMARTSIG:1 Via Meter 4-5 Times Daily   VITAMIN D PO Take 1 tablet by mouth daily.    Immunization History  Administered Date(s) Administered   Moderna Sars-Covid-2 Vaccination 01/25/2020, 02/22/2020   Tdap 07/15/2017, 06/05/2018, 06/10/2019   Zoster Recombinat (Shingrix) 06/10/2019, 09/01/2019   Offered influenza vaccine today, patient declined.    Objective:   Physical Exam BP 130/80 (BP Location: Left  Arm, Patient Position: Sitting, Cuff Size: Normal)   Pulse 89   Temp (!) 97.1 F (36.2 C) (Oral)   Ht 5\' 2"  (1.575 m)   Wt 203 lb 12.8 oz (92.4 kg)   SpO2 97%   BMI 37.28 kg/m  GENERAL: Obese woman, no acute distress, fully ambulatory.  No conversational dyspnea. HEAD: Normocephalic, atraumatic.  EYES: Pupils equal, round, reactive to light.  No scleral icterus.  MOUTH: Nose/mouth/throat not examined due to masking requirements for COVID 19. NECK: Supple. No thyromegaly. Trachea midline. No JVD.  No adenopathy. PULMONARY: Good air entry bilaterally.  No adventitious sounds. CARDIOVASCULAR: S1 and S2. Regular rate and rhythm.  No rubs, murmurs or gallops heard. ABDOMEN: Obese otherwise benign. MUSCULOSKELETAL: No joint deformity, no clubbing, no edema.  NEUROLOGIC: No focal deficit, no gait disturbance, speech is fluent. SKIN: Intact,warm,dry. PSYCH: Flat affect, behavior normal.  Representative image of CT performed 29 July 2021 independently reviewed:     Assessment & Plan:     ICD-10-CM   1. SOB (shortness of breath)  R06.02 Pulmonary Function Test [facility] Only   Likely due to poorly compensated COPD RB ILD may be playing a part PFTs    2. COPD, severity to be determined [facility])  J44.9    Patient states she has Dulera at  home Start using [facility] 2 puffs twice a day She does have asthmatic component Albuterol for rescue    3. 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 Tobacco dependence due to cigarettes  F17.210    Patient counseled regards to discontinuation of smoking Total counseling time 3 to 5 minutes     Orders Placed This Encounter  Procedures   Pulmonary Function Test [facility] Only    Standing Status:   Future    Standing Expiration Date:   09/09/2022    Order Specific Question:   Full PFT: includes the following: basic spirometry, spirometry pre & post bronchodilator, diffusion capacity (DLCO), lung volumes    Answer:   Full PFT    Order Specific Question:   This test can only be performed at    Answer:   [facility]   Patient needs to quit smoking first and foremost.  All of her respiratory disease is related to smoking.  We will see her in follow-up in 3 months time she is to contact us prior to that time should any new difficulties arise.   Renold Don, MD Advanced Bronchoscopy PCCM [facility]    *This note was dictated using voice recognition software/Dragon.  Despite best efforts to proofread, errors can occur which can change the meaning.  Any change was purely unintentional.

## 2021-09-09 NOTE — Patient Instructions (Signed)
I recommend you use your Dulera 2 puffs twice a day.  Make sure you rinse your mouth well after you use it.  Use the albuterol (rescue inhaler) 2 puffs up to 4 times a day only as needed for shortness of breath.  We are scheduling breathing tests.  Continue efforts to quit smoking.  The findings of your CT of the lungs are related to smoking.  We will see her in follow-up in 3 months time call sooner should any new problems arise.

## 2021-09-10 ENCOUNTER — Telehealth: Payer: Self-pay | Admitting: Pulmonary Disease

## 2021-09-10 ENCOUNTER — Other Ambulatory Visit: Admission: RE | Admit: 2021-09-10 | Payer: Medicare Other | Source: Ambulatory Visit

## 2021-09-10 NOTE — Telephone Encounter (Signed)
Received message from [place] with pre admit testing. patient no showed for covid test.  It appears that covid test was scheduled for sleep study. Covid test is not required for sleep study. Our office did not order sleep study.  Patient is scheduled for PFT on 09/17/2021 and covid test is required on 09/16/2021. Patient is aware of dates/times and voiced her understanding.  Nothing further needed at this time.

## 2021-09-13 ENCOUNTER — Telehealth: Payer: Self-pay

## 2021-09-13 NOTE — Telephone Encounter (Signed)
Patient is aware of date/time of covid test.   

## 2021-09-16 ENCOUNTER — Other Ambulatory Visit: Admission: RE | Admit: 2021-09-16 | Payer: Medicare Other | Source: Ambulatory Visit

## 2021-09-17 ENCOUNTER — Telehealth: Payer: Self-pay | Admitting: Pulmonary Disease

## 2021-09-17 ENCOUNTER — Ambulatory Visit: Payer: Medicare Other | Attending: Pulmonary Disease

## 2021-09-17 NOTE — Telephone Encounter (Signed)
Patient no showed for covid test.  She is aware that PFT will need to be rescheduled.   Rodena Piety, please advise. thanks

## 2021-09-20 ENCOUNTER — Other Ambulatory Visit: Payer: Self-pay

## 2021-09-20 DIAGNOSIS — R0602 Shortness of breath: Secondary | ICD-10-CM

## 2021-09-24 NOTE — Telephone Encounter (Signed)
I have spoke with Mrs. Allmendinger and her Covid Test and PFT have been rescheduled again [facility] Test 10/07/21 @ 11:45am PFT appt 10/08/21 @ 1:00pm PFTinstructions mailed to patient

## 2021-10-04 ENCOUNTER — Telehealth: Payer: Self-pay

## 2021-10-04 NOTE — Telephone Encounter (Signed)
Called and LVM to patient about upcoming Covid test. Nothing further needed.

## 2021-10-04 NOTE — Telephone Encounter (Signed)
Patient is aware of date/time of covid test.   

## 2021-10-07 ENCOUNTER — Other Ambulatory Visit: Payer: Self-pay

## 2021-10-07 ENCOUNTER — Other Ambulatory Visit
Admission: RE | Admit: 2021-10-07 | Discharge: 2021-10-07 | Disposition: A | Discharge status: Home / Self Care | Payer: Medicare Other | Source: Ambulatory Visit | Attending: Pulmonary Disease | Admitting: Pulmonary Disease

## 2021-10-07 DIAGNOSIS — Z01812 Encounter for preprocedural laboratory examination: Secondary | ICD-10-CM | POA: Insufficient documentation

## 2021-10-07 DIAGNOSIS — Z20822 Contact with and (suspected) exposure to covid-19: Secondary | ICD-10-CM | POA: Diagnosis not present

## 2021-10-08 ENCOUNTER — Ambulatory Visit: Payer: Medicare Other | Attending: Pulmonary Disease

## 2021-10-08 DIAGNOSIS — R0602 Shortness of breath: Secondary | ICD-10-CM | POA: Insufficient documentation

## 2021-10-08 LAB — SARS CORONAVIRUS 2 (TAT 6-24 HRS): SARS Coronavirus 2: NEGATIVE

## 2021-10-31 ENCOUNTER — Other Ambulatory Visit: Payer: Self-pay | Admitting: Internal Medicine

## 2021-10-31 DIAGNOSIS — Z1231 Encounter for screening mammogram for malignant neoplasm of breast: Secondary | ICD-10-CM

## 2021-11-22 ENCOUNTER — Other Ambulatory Visit: Payer: Self-pay | Admitting: Surgery

## 2021-11-25 ENCOUNTER — Other Ambulatory Visit: Payer: Self-pay

## 2021-11-25 ENCOUNTER — Other Ambulatory Visit: Payer: Medicare Other

## 2021-11-25 ENCOUNTER — Other Ambulatory Visit
Admission: RE | Admit: 2021-11-25 | Discharge: 2021-11-25 | Disposition: A | Discharge status: Home / Self Care | Payer: Medicare Other | Source: Ambulatory Visit | Attending: Surgery | Admitting: Surgery

## 2021-11-25 HISTORY — DX: Family history of other specified conditions: Z84.89

## 2021-11-25 HISTORY — DX: Gastro-esophageal reflux disease without esophagitis: K21.9

## 2021-11-25 NOTE — Patient Instructions (Addendum)
Your procedure is scheduled on: 11/27/21 - Wednesday Report to the Registration Desk on the 1st floor of the [facility]. To find out your arrival time, please call [phone] between 1PM - 3PM on: 11/26/21 - Tuesday  REMEMBER: Instructions that are not followed completely may result in serious medical risk, up to and including death; or upon the discretion of your surgeon and anesthesiologist your surgery may need to be rescheduled.  Do not eat food after midnight the night before surgery.  No gum chewing, lozengers or hard candies.  You may however, drink CLEAR liquids up to 2 hours before you are scheduled to arrive for your surgery. Do not drink anything within 2 hours of your scheduled arrival time.  Clear liquids include: - water   Type 1 and Type 2 diabetics should only drink water.  TAKE THESE MEDICATIONS THE MORNING OF SURGERY WITH A SIP OF WATER:  - metoprolol tartrate (LOPRESSOR) 25 MG tablet - omeprazole (PRILOSEC) 20 MG capsule, (take one the night before and one on the morning of surgery - helps to prevent nausea after surgery.) - mometasone-formoterol (DULERA) 100-5 MCG/ACT AERO - pregabalin (LYRICA) 50 MG capsule - venlafaxine XR (EFFEXOR-XR) 75 MG 24 hr capsule - celecoxib (CELEBREX) 200 MG capsule - doxycycline (VIBRAMYCIN) 100 MG capsule  Use albuterol (VENTOLIN HFA) 108 (90 Base) MCG/ACT inhaler on the day of surgery and bring to the hospital.  - Do Not take metFORMIN (GLUCOPHAGE-XR) 500 MG 24 hr tablet on 01/16, 01/17 and do not take the day of surgery.  One week prior to surgery: meloxicam (MOBIC) 15 MG tablet Stop Anti-inflammatories (NSAIDS) such as Advil, Aleve, Ibuprofen, Motrin, Naproxen, Naprosyn and Aspirin based products such as Excedrin, Goodys Powder, BC Powder.  Stop ANY OVER THE COUNTER supplements until after surgery.  You may however, continue to take Tylenol if needed for pain up until the day of surgery.  No Alcohol for 24 hours before  or after surgery.  No Smoking including e-cigarettes for 24 hours prior to surgery.  No chewable tobacco products for at least 6 hours prior to surgery.  No nicotine patches on the day of surgery.  Do not use any "recreational" drugs for at least a week prior to your surgery.  Please be advised that the combination of cocaine and anesthesia may have negative outcomes, up to and including death. If you test positive for cocaine, your surgery will be cancelled.  On the morning of surgery brush your teeth with toothpaste and water, you may rinse your mouth with mouthwash if you wish. Do not swallow any toothpaste or mouthwash.  Do not wear jewelry, make-up, hairpins, clips or nail polish.  Do not wear lotions, powders, or perfumes.   Do not shave body from the neck down 48 hours prior to surgery just in case you cut yourself which could leave a site for infection.  Also, freshly shaved skin may become irritated if using the CHG soap.  Contact lenses, hearing aids and dentures may not be worn into surgery.  Do not bring valuables to the hospital. [facility] is not responsible for any missing/lost belongings or valuables.   Notify your doctor if there is any change in your medical condition (cold, fever, infection).  Wear comfortable clothing (specific to your surgery type) to the hospital.  After surgery, you can help prevent lung complications by doing breathing exercises.  Take deep breaths and cough every 1-2 hours. Your doctor may order a device called an Chiropodist  to help you take deep breaths. When coughing or sneezing, hold a pillow firmly against your incision with both hands. This is called splinting. Doing this helps protect your incision. It also decreases belly discomfort.  If you are being admitted to the hospital overnight, leave your suitcase in the car. After surgery it may be brought to your room.  If you are being discharged the day of surgery, you will  not be allowed to drive home. You will need a responsible adult (18 years or older) to drive you home and stay with you that night.   If you are taking public transportation, you will need to have a responsible adult (18 years or older) with you. Please confirm with your physician that it is acceptable to use public transportation.   Please call the [facility]. at [phone] if you have any questions about these instructions.  Surgery Visitation Policy:  Patients undergoing a surgery or procedure may have one family member or support person with them as long as that person is not COVID-19 positive or experiencing its symptoms.  That person may remain in the waiting area during the procedure and may rotate out with other people.  Inpatient Visitation:    Visiting hours are 7 a.m. to 8 p.m. Up to two visitors ages 16+ are allowed at one time in a patient room. The visitors may rotate out with other people during the day. Visitors must check out when they leave, or other visitors will not be allowed. One designated support person may remain overnight. The visitor must pass COVID-19 screenings, use hand sanitizer when entering and exiting the patients room and wear a mask at all times, including in the patients room. Patients must also wear a mask when staff or their visitor are in the room. Masking is required regardless of vaccination status.

## 2021-11-26 MED ORDER — ORAL CARE MOUTH RINSE: 15.0000 mL | Freq: Once | OROMUCOSAL | Status: AC

## 2021-11-26 MED ORDER — CEFAZOLIN SODIUM-DEXTROSE 2-4 GM/100ML-% IV SOLN
2.0000 g | INTRAVENOUS | Status: AC
  Administered 2021-11-27: 2 g via INTRAVENOUS

## 2021-11-26 MED ORDER — SODIUM CHLORIDE 0.9 % IV SOLN: INTRAVENOUS | Status: DC

## 2021-11-26 MED ORDER — CHLORHEXIDINE GLUCONATE 0.12 % MT SOLN: 15.0000 mL | Freq: Once | OROMUCOSAL | Status: AC

## 2021-11-27 ENCOUNTER — Ambulatory Visit: Payer: Medicare Other

## 2021-11-27 ENCOUNTER — Other Ambulatory Visit: Payer: Self-pay

## 2021-11-27 ENCOUNTER — Ambulatory Visit: Payer: Medicare Other | Admitting: Urgent Care

## 2021-11-27 ENCOUNTER — Encounter: Payer: Self-pay | Admitting: Surgery

## 2021-11-27 ENCOUNTER — Ambulatory Visit
Admission: RE | Admit: 2021-11-27 | Discharge: 2021-11-27 | Disposition: A | Discharge status: Home / Self Care | Payer: Medicare Other | Attending: Surgery | Admitting: Surgery

## 2021-11-27 ENCOUNTER — Encounter
Admission: RE | Disposition: A | Discharge status: Home / Self Care | Payer: Self-pay | Source: Home / Self Care | Attending: Surgery

## 2021-11-27 DIAGNOSIS — Z7984 Long term (current) use of oral hypoglycemic drugs: Secondary | ICD-10-CM | POA: Insufficient documentation

## 2021-11-27 DIAGNOSIS — Z7952 Long term (current) use of systemic steroids: Secondary | ICD-10-CM | POA: Insufficient documentation

## 2021-11-27 DIAGNOSIS — F32A Depression, unspecified: Secondary | ICD-10-CM | POA: Diagnosis not present

## 2021-11-27 DIAGNOSIS — F419 Anxiety disorder, unspecified: Secondary | ICD-10-CM | POA: Diagnosis not present

## 2021-11-27 DIAGNOSIS — M19042 Primary osteoarthritis, left hand: Secondary | ICD-10-CM | POA: Insufficient documentation

## 2021-11-27 DIAGNOSIS — J449 Chronic obstructive pulmonary disease, unspecified: Secondary | ICD-10-CM | POA: Insufficient documentation

## 2021-11-27 DIAGNOSIS — Z79899 Other long term (current) drug therapy: Secondary | ICD-10-CM | POA: Insufficient documentation

## 2021-11-27 DIAGNOSIS — I10 Essential (primary) hypertension: Secondary | ICD-10-CM | POA: Diagnosis not present

## 2021-11-27 DIAGNOSIS — E1142 Type 2 diabetes mellitus with diabetic polyneuropathy: Secondary | ICD-10-CM | POA: Insufficient documentation

## 2021-11-27 DIAGNOSIS — F411 Generalized anxiety disorder: Secondary | ICD-10-CM

## 2021-11-27 HISTORY — PX: CARPOMETACARPAL (CMC) FUSION OF THUMB: SHX6290

## 2021-11-27 LAB — GLUCOSE, CAPILLARY
Glucose-Capillary: 78 mg/dL (ref 70–99)
Glucose-Capillary: 107 mg/dL — ABNORMAL HIGH (ref 70–99)

## 2021-11-27 SURGERY — CARPOMETACARPAL (CMC) FUSION OF THUMB
Anesthesia: General | Site: Thumb | Laterality: Left

## 2021-11-27 MED ORDER — CELECOXIB 200 MG PO CAPS: 200.0000 mg | ORAL_CAPSULE | Freq: Every day | ORAL | 0 refills | Status: DC

## 2021-11-27 MED ORDER — ACETAMINOPHEN 10 MG/ML IV SOLN
INTRAVENOUS | Status: DC | PRN
  Administered 2021-11-27: 1000 mg via INTRAVENOUS

## 2021-11-27 MED ORDER — LIDOCAINE HCL (CARDIAC) PF 100 MG/5ML IV SOSY
PREFILLED_SYRINGE | INTRAVENOUS | Status: DC | PRN
  Administered 2021-11-27: 50 mg via INTRAVENOUS

## 2021-11-27 MED ORDER — DEXAMETHASONE SODIUM PHOSPHATE 10 MG/ML IJ SOLN
INTRAMUSCULAR | Status: AC
  Filled 2021-11-27: qty 1

## 2021-11-27 MED ORDER — HYDROCODONE-ACETAMINOPHEN 5-325 MG PO TABS: 1.0000 | ORAL_TABLET | ORAL | Status: DC | PRN

## 2021-11-27 MED ORDER — ONDANSETRON HCL 4 MG PO TABS: 4.0000 mg | ORAL_TABLET | Freq: Four times a day (QID) | ORAL | Status: DC | PRN

## 2021-11-27 MED ORDER — SODIUM CHLORIDE 0.9 % IV SOLN: INTRAVENOUS | Status: DC

## 2021-11-27 MED ORDER — ACETAMINOPHEN 10 MG/ML IV SOLN: 1000.0000 mg | Freq: Once | INTRAVENOUS | Status: DC | PRN

## 2021-11-27 MED ORDER — PROPOFOL 10 MG/ML IV BOLUS
INTRAVENOUS | Status: DC | PRN
  Administered 2021-11-27: 150 mg via INTRAVENOUS

## 2021-11-27 MED ORDER — CHLORHEXIDINE GLUCONATE 0.12 % MT SOLN
OROMUCOSAL | Status: AC
  Administered 2021-11-27: 15 mL via OROMUCOSAL
  Filled 2021-11-27: qty 15

## 2021-11-27 MED ORDER — DEXAMETHASONE SODIUM PHOSPHATE 10 MG/ML IJ SOLN
INTRAMUSCULAR | Status: DC | PRN
  Administered 2021-11-27: 5 mg via INTRAVENOUS

## 2021-11-27 MED ORDER — ONDANSETRON HCL 4 MG/2ML IJ SOLN: 4.0000 mg | Freq: Four times a day (QID) | INTRAMUSCULAR | Status: DC | PRN

## 2021-11-27 MED ORDER — ONDANSETRON HCL 4 MG/2ML IJ SOLN: 4.0000 mg | Freq: Once | INTRAMUSCULAR | Status: DC | PRN

## 2021-11-27 MED ORDER — KETOROLAC TROMETHAMINE 30 MG/ML IJ SOLN
INTRAMUSCULAR | Status: DC | PRN
  Administered 2021-11-27: 15 mg via INTRAVENOUS

## 2021-11-27 MED ORDER — LACTATED RINGERS IV SOLN: INTRAVENOUS | Status: DC | PRN

## 2021-11-27 MED ORDER — BUPIVACAINE HCL (PF) 0.5 % IJ SOLN
INTRAMUSCULAR | Status: AC
  Filled 2021-11-27: qty 30

## 2021-11-27 MED ORDER — ONDANSETRON HCL 4 MG/2ML IJ SOLN
INTRAMUSCULAR | Status: DC | PRN
Start: 2021-11-27 — End: 2021-11-27
  Administered 2021-11-27: 4 mg via INTRAVENOUS

## 2021-11-27 MED ORDER — FENTANYL CITRATE (PF) 100 MCG/2ML IJ SOLN
INTRAMUSCULAR | Status: AC
  Filled 2021-11-27: qty 2

## 2021-11-27 MED ORDER — CEFAZOLIN SODIUM-DEXTROSE 2-4 GM/100ML-% IV SOLN
INTRAVENOUS | Status: AC
  Filled 2021-11-27: qty 100

## 2021-11-27 MED ORDER — FENTANYL CITRATE (PF) 100 MCG/2ML IJ SOLN
25.0000 ug | INTRAMUSCULAR | Status: DC | PRN
  Administered 2021-11-27 (×4): 25 ug via INTRAVENOUS

## 2021-11-27 MED ORDER — 0.9 % SODIUM CHLORIDE (POUR BTL) OPTIME
TOPICAL | Status: DC | PRN
  Administered 2021-11-27: 500 mL

## 2021-11-27 MED ORDER — ONDANSETRON HCL 4 MG/2ML IJ SOLN
INTRAMUSCULAR | Status: AC
  Filled 2021-11-27: qty 2

## 2021-11-27 MED ORDER — HYDROCODONE-ACETAMINOPHEN 5-325 MG PO TABS: 1.0000 | ORAL_TABLET | Freq: Four times a day (QID) | ORAL | 0 refills | Status: DC | PRN

## 2021-11-27 MED ORDER — MIDAZOLAM HCL 2 MG/2ML IJ SOLN
INTRAMUSCULAR | Status: AC
  Filled 2021-11-27: qty 2

## 2021-11-27 MED ORDER — OXYCODONE HCL 5 MG/5ML PO SOLN: 5.0000 mg | Freq: Once | ORAL | Status: DC | PRN

## 2021-11-27 MED ORDER — FENTANYL CITRATE (PF) 100 MCG/2ML IJ SOLN
INTRAMUSCULAR | Status: DC | PRN
  Administered 2021-11-27: 50 ug via INTRAVENOUS
  Administered 2021-11-27 (×2): 25 ug via INTRAVENOUS

## 2021-11-27 MED ORDER — PHENYLEPHRINE 40 MCG/ML (10ML) SYRINGE FOR IV PUSH (FOR BLOOD PRESSURE SUPPORT)
PREFILLED_SYRINGE | INTRAVENOUS | Status: DC | PRN
  Administered 2021-11-27: 120 ug via INTRAVENOUS
  Administered 2021-11-27: 80 ug via INTRAVENOUS

## 2021-11-27 MED ORDER — KETOROLAC TROMETHAMINE 30 MG/ML IJ SOLN
INTRAMUSCULAR | Status: AC
  Filled 2021-11-27: qty 1

## 2021-11-27 MED ORDER — METOCLOPRAMIDE HCL 10 MG PO TABS: 5.0000 mg | ORAL_TABLET | Freq: Three times a day (TID) | ORAL | Status: DC | PRN

## 2021-11-27 MED ORDER — BUPIVACAINE HCL (PF) 0.5 % IJ SOLN
INTRAMUSCULAR | Status: DC | PRN
  Administered 2021-11-27: 10 mL

## 2021-11-27 MED ORDER — ACETAMINOPHEN 10 MG/ML IV SOLN
INTRAVENOUS | Status: AC
  Filled 2021-11-27: qty 100

## 2021-11-27 MED ORDER — PROPOFOL 10 MG/ML IV BOLUS
INTRAVENOUS | Status: AC
  Filled 2021-11-27: qty 20

## 2021-11-27 MED ORDER — MIDAZOLAM HCL 2 MG/2ML IJ SOLN
INTRAMUSCULAR | Status: DC | PRN
  Administered 2021-11-27: 1 mg via INTRAVENOUS

## 2021-11-27 MED ORDER — OXYCODONE HCL 5 MG PO TABS: 5.0000 mg | ORAL_TABLET | Freq: Once | ORAL | Status: DC | PRN

## 2021-11-27 MED ORDER — METOCLOPRAMIDE HCL 5 MG/ML IJ SOLN: 5.0000 mg | Freq: Three times a day (TID) | INTRAMUSCULAR | Status: DC | PRN

## 2021-11-27 MED ORDER — HYDROXYZINE PAMOATE 50 MG PO CAPS: 50.0000 mg | ORAL_CAPSULE | Freq: Every day | ORAL | Status: AC

## 2021-11-27 SURGICAL SUPPLY — 46 items
APL PRP STRL LF DISP 70% ISPRP (MISCELLANEOUS) ×2
APL SKNCLS STERI-STRIP NONHPOA (GAUZE/BANDAGES/DRESSINGS) ×1
BENZOIN TINCTURE PRP APPL 2/3 (GAUZE/BANDAGES/DRESSINGS) ×1 IMPLANT
BLADE DEBAKEY 8.0 (BLADE) ×2 IMPLANT
BLADE OSC/SAGITTAL 5.5X25 (BLADE) ×2 IMPLANT
BLADE SURG 15 STRL LF DISP TIS (BLADE) ×2 IMPLANT
BLADE SURG 15 STRL SS (BLADE) ×4
BNDG COHESIVE 4X5 TAN ST LF (GAUZE/BANDAGES/DRESSINGS) ×2 IMPLANT
BNDG CONFORM 3 STRL LF (GAUZE/BANDAGES/DRESSINGS) ×1 IMPLANT
BNDG ELASTIC 3X5.8 VLCR STR LF (GAUZE/BANDAGES/DRESSINGS) ×2 IMPLANT
BNDG ESMARK 4X12 TAN STRL LF (GAUZE/BANDAGES/DRESSINGS) ×2 IMPLANT
BUR 4X55 1 (BURR) ×2 IMPLANT
CAST PADDING 3X4FT ST 30246 (SOFTGOODS) ×2
CHLORAPREP W/TINT 26 (MISCELLANEOUS) ×3 IMPLANT
DRAPE FLUOR MINI C-ARM 54X84 (DRAPES) ×2 IMPLANT
FORCEPS JEWEL BIP 4-3/4 STR (INSTRUMENTS) ×2 IMPLANT
GAUZE 4X4 16PLY ~~LOC~~+RFID DBL (SPONGE) ×2 IMPLANT
GAUZE SPONGE 4X4 12PLY STRL (GAUZE/BANDAGES/DRESSINGS) ×1 IMPLANT
GAUZE XEROFORM 1X8 LF (GAUZE/BANDAGES/DRESSINGS) ×2 IMPLANT
GLOVE SURG ENC MOIS LTX SZ8 (GLOVE) ×4 IMPLANT
GLOVE SURG UNDER LTX SZ8 (GLOVE) ×2 IMPLANT
GOWN STRL REUS W/ TWL XL LVL3 (GOWN DISPOSABLE) ×1 IMPLANT
GOWN STRL REUS W/TWL XL LVL3 (GOWN DISPOSABLE) ×2
KIT TURNOVER KIT A (KITS) ×2 IMPLANT
MANIFOLD NEPTUNE II (INSTRUMENTS) ×2 IMPLANT
NS IRRIG 500ML POUR BTL (IV SOLUTION) ×2 IMPLANT
PACK EXTREMITY ARMC (MISCELLANEOUS) ×2 IMPLANT
PAD CAST CTTN 3X4 STRL (SOFTGOODS) ×2 IMPLANT
PADDING CAST COTTON 3X4 STRL (SOFTGOODS) ×2
PASSER SUT SWANSON 36MM LOOP (INSTRUMENTS) IMPLANT
SPLINT CAST 1 STEP 3X12 (MISCELLANEOUS) ×2 IMPLANT
STOCKINETTE 48X4 2 PLY STRL (GAUZE/BANDAGES/DRESSINGS) ×1 IMPLANT
STOCKINETTE IMPERVIOUS 9X36 MD (GAUZE/BANDAGES/DRESSINGS) ×2 IMPLANT
STOCKINETTE STRL 4IN 9604848 (GAUZE/BANDAGES/DRESSINGS) ×2 IMPLANT
STRIP CLOSURE SKIN 1/2X4 (GAUZE/BANDAGES/DRESSINGS) ×1 IMPLANT
STRIP CLOSURE SKIN 1/4X4 (GAUZE/BANDAGES/DRESSINGS) ×1 IMPLANT
SUT ETHIBOND 0 MO6 C/R (SUTURE) ×2 IMPLANT
SUT PROLENE 4 0 PS 2 18 (SUTURE) ×2 IMPLANT
SUT VIC AB 2-0 CT2 27 (SUTURE) ×2 IMPLANT
SUT VIC AB 2-0 SH 27 (SUTURE) ×2
SUT VIC AB 2-0 SH 27XBRD (SUTURE) ×1 IMPLANT
SUT VIC AB 3-0 SH 27 (SUTURE) ×2
SUT VIC AB 3-0 SH 27X BRD (SUTURE) ×1 IMPLANT
SYSTEM IMPLANT TIGHTROPE MINI (Anchor) ×1 IMPLANT
WATER STERILE IRR 500ML POUR (IV SOLUTION) ×2 IMPLANT
WIRE Z .062 C-WIRE SPADE TIP (WIRE) IMPLANT

## 2021-11-27 NOTE — Op Note (Signed)
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  Post-Op Diagnosis:   Same.  Procedure:   Suspension arthroplasty left thumb CMC joint.   Surgeon:   Pascal Lux, MD  Assistant:   Cyd Silence, PA-S  Anesthesia:   General LMA  Findings:   As above.  Complications:   None  EBL:   1 cc  Fluids:   1000 cc crystalloid  TT:   60 minutes at 250 mmHg  Drains:   None  Closure:   3-0 Vicryl subcuticular sutures for radial thumb incision and 4-0 Prolene interrupted sutures for dorsal hand incision  Implants:   Arthrex 1.1 mm CMC Mini Tightrope.  Brief Clinical Note:   The patient is a 61 year old female with a history of progressive worsening left basilar thumb pain. The patient's symptoms have progressed despite medications, activity modification, injections, splinting, etc. The patient's history and examination are consistent with advanced degenerative joint disease of the left thumb CMC joint which was confirmed by plain radiographs. The patient presents at this time for a suspension arthroplasty of the left thumb CMC joint.  Procedure:    The patient was brought into the operating room and lain in the supine position. After adequate general laryngeal mask anesthesia was obtained, the patient's left hand and upper extremity were prepped with ChloraPrep solution before being draped sterilely. Preoperative antibiotics were administered. A timeout was performed to verify the appropriate surgical site before the limb was exsanguinated with an Esmarch and the tourniquet inflated to 250 mmHg.   An approximately 4-5 cm longitudinal incision was made centered over the radial aspect of the thumb CMC joint. The incision was carried down through the subcutaneous cutaneous tissues with care taken to identify and protect the local neurovascular structures. Several small veins were cauterized with bipolar  electrocautery. A longitudinal incision was made through the capsular tissues over the dorsal aspect of the thumb CMC joint. Subperiosteal dissection was carried out to expose the trapezium dorsally and volarly. Once the [facility] and scaphotrapezial joints were identified, a sagittal cut was made in the trapezium using the micro-oscillating saw. This cut extended approximately two-thirds down through the bone. A Hoke osteotome was used to complete transection of the bone. The bone was then removed piecemeal using rongeurs and further dissection. The flexor carpi radialis tendon was identified at the base of the defect. The base of the thumb metacarpal was prepared by exposing its radial base.  The Arthrex 1.1 mm guidewire was drilled up through the proximal end of the thumb metacarpal beginning at the dorsoradial surface and advancing into the volar radial aspect of the proximal index metacarpal shaft. The adequacy of pin position was verified using FluoroScan imaging in AP, lateral, and oblique projections and found to be excellent.  An approximate 1.5 cm incision was made over the dorsal aspect of the proximal end of the index metacarpal. The incision was carried down through the subcutaneous tissues with care taken to avoid damaging the extensor tendon. The interosseous muscle was dissected away from the dorso-ulnar aspect of the proximal index metacarpal shaft before the guidewire was advanced through the final cortex into this wound. The suture was passed through the nitinol wire loop and pulled across the thumb and index metacarpals, seating the mini-Endobutton against the proximal end of the thumb metacarpal. The suture was cut and each and passed through the second mini-Endobutton. The Endobutton was advanced  to rest against the dorso-ulnar aspect of the proximal index metacarpal shaft before it was tied securely with appropriate tension to stabilize the base of the thumb metacarpal against the base of the  index metacarpal. Again, the adequacy of Endobutton position and metacarpal reduction was verified fluoroscopically in AP, lateral, and oblique projections and found to be excellent. The thumb was then placed through a range of motion. It was able to be abducted and adducted fully and remained distracted to gentle axial loading.  The wounds were copiously irrigated with sterile saline solution using bulb irrigation. The thumb CMC capsular tissues were reapproximated using 2-0 Vicryl interrupted sutures before the skin was closed using 3-0 Vicryl subcuticular interrupted sutures. Benzoin and Steri-Strips were applied to the skin. The skin of the more dorsal incision was closed using 4-0 Prolene interrupted sutures. A total of 10 cc of 0.5% plain Sensorcaine was injected in and around both incisions to help with postoperative analgesia. A sterile bulky dressing was applied to the wounds before the patient was placed into a fiberglass thumb spica splint maintaining the thumb in the position of function. The patient was then awakened, extubated, and returned to the recovery room in satisfactory condition after tolerating the procedure well.

## 2021-11-27 NOTE — Discharge Instructions (Addendum)
AMBULATORY SURGERY  DISCHARGE INSTRUCTIONS   The drugs that you were given will stay in your system until tomorrow so for the next 24 hours you should not:  Drive an automobile Make any legal decisions Drink any alcoholic beverage   You may resume regular meals tomorrow.  Today it is better to start with liquids and gradually work up to solid foods.  You may eat anything you prefer, but it is better to start with liquids, then soup and crackers, and gradually work up to solid foods.   Please notify your doctor immediately if you have any unusual bleeding, trouble breathing, redness and pain at the surgery site, drainage, fever, or pain not relieved by medication.    Additional Instructions:     Please contact your physician with any problems or Same Day Surgery at [phone], Monday through Friday 6 am to 4 pm, or [place] [facility] number at [phone]. Orthopedic discharge instructions: Keep splint dry and intact. Keep hand elevated above heart level. Apply ice to affected area frequently. Take Celebrex 200 mg p.o. daily with food. Take pain medication as prescribed or ES Tylenol when needed.  Return for follow-up in 10-14 days or as scheduled.

## 2021-11-27 NOTE — Progress Notes (Signed)
Notified Dr. Bertell Maria of blood glucose of 78. No new orders at this time.

## 2021-11-27 NOTE — Transfer of Care (Signed)
Immediate Anesthesia Transfer of Care Note  Patient: Sally Hughes  Procedure(s) Performed: LEFT THUMB [place] (Left: Thumb)  Patient Location: PACU  Anesthesia Type:General  Level of Consciousness: awake and drowsy  Airway & Oxygen Therapy: Patient Spontanous Breathing  Post-op Assessment: Report given to RN and Post -op Vital signs reviewed and stable  Post vital signs: Reviewed and stable  Last Vitals:  Vitals Value Taken Time  BP 120/72 11/27/21 1536  Temp    Pulse 103 11/27/21 1540  Resp 13 11/27/21 1540  SpO2 97 % 11/27/21 1540  Vitals shown include unvalidated device data.  Last Pain:  Vitals:   11/27/21 0943  TempSrc: Temporal  PainSc: 8          Complications: No notable events documented.

## 2021-11-27 NOTE — Anesthesia Preprocedure Evaluation (Signed)
Anesthesia Evaluation  Patient identified by MRN, date of birth, ID band Patient awake    Reviewed: Allergy & Precautions, NPO status , Patient's Chart, lab work & pertinent test results  History of Anesthesia Complications Negative for: history of anesthetic complications  Airway Mallampati: II  TM Distance: >3 FB Neck ROM: Full   Comment: Prior grade 1 view with miller 2 Dental  (+) Edentulous Upper, Edentulous Lower, Dental Advisory Given   Pulmonary shortness of breath and with exertion, asthma , neg sleep apnea, COPD,  COPD inhaler, Current SmokerPatient did not abstain from smoking.,  Inhalers only PRN, takes couple times per month. Took one today  Possible undiagnosed OSA; patient says her doctor wants her to get a sleep study   Pulmonary exam normal breath sounds clear to auscultation       Cardiovascular Exercise Tolerance: Poor METS: 3 - Mets hypertension, (-) CAD and (-) Past MI (-) dysrhythmias  Rhythm:Regular Rate:Normal - Systolic murmurs    Neuro/Psych  Headaches, PSYCHIATRIC DISORDERS Anxiety Depression    GI/Hepatic GERD  Medicated and Controlled,(+)     (-) substance abuse  , Hepatitis -, CTreated w harvoni   Endo/Other  diabetes, Oral Hypoglycemic Agents  Renal/GU negative Renal ROS     Musculoskeletal  (+) Arthritis ,   Abdominal (+) + obese,   Peds  Hematology   Anesthesia Other Findings Past Medical History: No date: Arthritis     Comment:  whole body No date: Asthma     Comment:  inhaler in summer No date: Back pain No date: Bell's palsy     Comment:  12 yrs ago, right eye weaker No date: COPD (chronic obstructive pulmonary disease) (HCC)     Comment:  wheezing No date: Depression No date: Diabetes mellitus, type 2 (HCC) No date: Dyspnea No date: Headache     Comment:  about everyday No date: Hepatitis C No date: Hypertension No date: Kidney tumor (benign), left No date:  Pneumonia     Comment:  in past No date: Snake bite     Comment:  as a child  Reproductive/Obstetrics                             Anesthesia Physical  Anesthesia Plan  ASA: 3  Anesthesia Plan: General   Post-op Pain Management: Ofirmev IV (intra-op)   Induction: Intravenous and Rapid sequence  PONV Risk Score and Plan: 3 and Ondansetron, Dexamethasone and Midazolam  Airway Management Planned: LMA  Additional Equipment: None  Intra-op Plan:   Post-operative Plan: Extubation in OR  Informed Consent: I have reviewed the patients History and Physical, chart, labs and discussed the procedure including the risks, benefits and alternatives for the proposed anesthesia with the patient or authorized representative who has indicated his/her understanding and acceptance.     Dental advisory given  Plan Discussed with: CRNA and Surgeon  Anesthesia Plan Comments: (Discussed risks of anesthesia with patient, including PONV, sore throat, lip/dental/eye damage. Rare risks discussed as well, such as cardiorespiratory and neurological sequelae, and allergic reactions. Discussed the role of CRNA in patient's perioperative care. Patient understands.)        Anesthesia Quick Evaluation

## 2021-11-27 NOTE — H&P (Signed)
History of Present Illness: Sally Hughes is a 60 y.o. female who presents today for evaluation of left greater than right wrist pain. The patient has been experiencing pain in the left wrist over the past 3-4 years however she states that it has been worsening over the past several months. She denies any trauma or injury affecting the left wrist or hand. The patient did undergo x-rays of the left hand ordered by her primary care provider, which are detailed in the imaging section below. The patient is right-hand dominant. She does have a history of the right ganglion cyst. She also reports numbness and tingling in the right hand, she did recently undergo a nerve conduction study which demonstrated evidence of chronic, mild right carpal tunnel syndrome. Pain is primarily at the base of the left thumb and is worse with any attempted grabbing or lifting with the left hand. She denies any surgical history to the left hand. She reports an aching, throbbing discomfort, pain does occasionally radiate into the forearm and into the tip of the thumb. The patient denies any history of dislocation to the left hand or fingers. She denies any skin changes or fevers or chills at home. She has not been wearing any bracing, she does take Celebrex as needed for discomfort at this time. The patient denies any history of heart attack, stroke or blood clot. She does have a history of COPD and does use a inhaler routinely.  Past Medical History:  Anxiety   Anxiety   COPD (chronic obstructive pulmonary disease) (CMS-HCC)   Depression (emotion)   Diabetes mellitus without complication (CMS-HCC)   Hypertension   Low back pain   Migraine headache   Past Surgical History:  HERNIA REPAIR 06/13/2020 (Dr E. Cintron [facility])   EXPLORATION KIDNEY   EYELID SURGERY   Past Family History:  Deep vein thrombosis (DVT or abnormal blood clot formation) Mother   Diabetes Mother   Coronary Artery Disease (Blocked  arteries around heart) Father   Cancer Father   Cancer Brother   Medications:  amitriptyline (ELAVIL) 100 MG tablet Take 1 tablet (100 mg total) by mouth at bedtime 90 tablet 1   APPLE CIDER VINEGAR ORAL Take by mouth   ascorbic acid, vitamin C, (VITAMIN C) 1000 MG tablet Take 1,000 mg by mouth once daily   celecoxib (CELEBREX) 200 MG capsule Take 1 capsule (200 mg total) by mouth 2 (two) times daily 180 capsule 1   cyanocobalamin, vitamin B-12, (VITAMIN B-12 ORAL) Take by mouth   cyclobenzaprine (FLEXERIL) 10 MG tablet TAKE 1 TABLET BY MOUTH TWICE DAILY 120 tablet 0   docosahexaenoic acid/epa (FISH OIL ORAL) Take by mouth   fluticasone-umeclidinium-vilanterol (TRELEGY ELLIPTA) 200-62.5-25 mcg inhaler Inhale 1 inhalation into the lungs once daily 28 each 2   hydrOXYzine (VISTARIL) 50 MG capsule TAKE (1) CAPSULE BY MOUTH DAILY AT BEDTIME AS NEEDED FOR ITCHING 90 capsule 1   meclizine (ANTIVERT) 25 mg tablet Take 1 tablet (25 mg total) by mouth once daily as needed for Dizziness 90 tablet 0   meloxicam (MOBIC) 15 MG tablet Take 1 tablet (15 mg total) by mouth once daily 30 tablet 0   metFORMIN (GLUCOPHAGE-XR) 500 MG XR tablet Take 1 tablet (500 mg total) by mouth daily with dinner 30 tablet 11   metoprolol tartrate (LOPRESSOR) 25 MG tablet Take 1 tablet (25 mg total) by mouth 2 (two) times daily 180 tablet 1   nicotine (NICODERM CQ) 21 mg/24 hr patch Place onto  the skin   omeprazole (PRILOSEC) 20 MG DR capsule Take 1 capsule (20 mg total) by mouth once daily 90 capsule 1   pravastatin (PRAVACHOL) 40 MG tablet Take 1 tablet (40 mg total) by mouth at bedtime 90 tablet 1   predniSONE (DELTASONE) 20 MG tablet Take 2 tablets by mouth daily for 5 days followed by 1 tablet by mouth daily for 5 days and stop. 15 tablet 0   pregabalin (LYRICA) 50 MG capsule Take 50mg  twice daily. 60 capsule 3   PROAIR HFA 90 mcg/actuation inhaler as needed   rOPINIRole (REQUIP) 0.5 MG tablet Take 1 tablet (0.5 mg total)  by mouth at bedtime 90 tablet 1   TRUE METRIX GLUCOSE TEST STRIP test strip   venlafaxine (EFFEXOR-XR) 75 MG XR capsule TAKE (1) CAPSULE BY MOUTH TWICE DAILY. 180 capsule 1   VITAMIN D3 ORAL Take by mouth   doxycycline (VIBRAMYCIN) 100 MG capsule Take 1 capsule (100 mg total) by mouth 2 (two) times daily for 10 days 20 capsule 0   Current Facility-Administered Medications Ordered in Epic  Medication Dose Route Frequency Provider Last Rate Last Admin   lidocaine-EPINEPHrine (PF) 1.5 %-1:200,000 injection PRN Orest Dikes, MD 45 mg at 07/16/17 0725   Allergies:  Buspirone Other (confused)   Gabapentin Hives and Other (Involuntary jerking and hives)   Review of Systems:  A comprehensive 14 point ROS was performed, reviewed by me today, and the pertinent orthopaedic findings are documented in the HPI.  Physical Exam: BP (!) 124/90 (BP Location: Left upper arm, Patient Position: Sitting, BP Cuff Size: Adult)   Ht 160 cm (5' 2.99")   Wt 87.2 kg (192 lb 3.2 oz)   BMI 34.06 kg/m  General/Constitutional: The patient appears to be well-nourished, well-developed, and in no acute distress. Neuro/Psych: Normal mood and affect, oriented to person, place and time. Eyes: Non-icteric. Pupils are equal, round, and reactive to light, and exhibit synchronous movement. ENT: Unremarkable. Lymphatic: No palpable adenopathy. Respiratory: Lungs clear to auscultation, Normal chest excursion, No wheezes and Non-labored breathing Cardiovascular: Regular rate and rhythm. No murmurs. and No edema, swelling or tenderness, except as noted in detailed exam. Integumentary: No impressive skin lesions present, except as noted in detailed exam. Musculoskeletal: Unremarkable, except as noted in detailed exam.  General: Well developed, well nourished 61 y.o. female in no apparent distress. Normal affect. Normal communication. Patient answers questions appropriately. The patient has a normal gait. There is no antalgic  component. There is no hip lurch.   Left Upper Extremity: Examination of the left hand revealed no ecchymosis, and no edema. The patient reports increased discomfort with any attempted motion of the left thumb. No significant tenderness with palpation of the wrist however she does report severe tenderness with palpation over the left thumb CMC joint. Positive grind test to the left thumb. There was no instability of the wrist or hand. Mild subluxation noted to the thumb at the [facility] joint. There was no triggering of the digits. The patient had a negative Finkelstein test. The patient had no snuffbox tenderness. The patient had full composite fist. There was no angulation or rotation of the digits.   Neurologic: The patient had sensation that was intact to light touch. The patient had full motor strength. There was no tremor or clonus noted.   Vascular: The patient had good skin warmth. The radial and ulnar pulse were normal and intact. The patient had less than 2 second capillary refill.   Imaging: X-rays  of the left hand were obtained by the patient's primary care provider. These x-rays demonstrate mild-moderate arthritic changes throughout the left hand however most severe at the [facility] joint of the left thumb with near complete loss of joint space in addition to early subluxation of the metacarpal. No acute fracture or evidence of dislocation.  Impression: 1. Primary osteoarthritis of first carpometacarpal joint of left hand. 2. Type 2 diabetes mellitus with diabetic polyneuropathy.  Plan:  1. Treatment options were discussed today with the patient. 2. Instructed the patient that x-rays of her left hand do reveal moderate arthritic changes about the hand however most significant at the [facility] joint of the left thumb. 3. The patient is quite frustrated by her continued discomfort. She was offered but declined a left thumb CMC joint steroid injection. 4. Did discuss the risk and benefits of a CMC  joint arthroplasty including but not limited to continued discomfort caused by her other areas of arthritic changes. 5. The patient understands the risk and benefits of surgery and wishes to proceed at this time. She will be scheduled with Dr. Roland Rack in the future. 6. This document will serve as a surgical history and physical for the patient. She was offered and received a thumb spica Velcro wrist splint to begin wearing for support at this time. 7. The patient will follow-up per standard postop protocol. They can call the clinic they have any questions, new symptoms develop or symptoms worsen.  The procedure was discussed with the patient, as were the potential risks (including bleeding, infection, nerve and/or blood vessel injury, persistent or recurrent pain, failure of the repair, progression of arthritis, need for further surgery, blood clots, strokes, heart attacks and/or arhythmias, pneumonia, etc.) and benefits. The patient states her understanding and wishes to proceed.   H&P reviewed and patient re-examined. No changes.

## 2021-11-28 ENCOUNTER — Encounter: Payer: Self-pay | Admitting: Surgery

## 2021-11-29 LAB — COLOGUARD: COLOGUARD: POSITIVE — AB

## 2021-12-01 NOTE — Anesthesia Postprocedure Evaluation (Signed)
Anesthesia Post Note  Patient: Sally Hughes  Procedure(s) Performed: LEFT THUMB [place] (Left: Thumb)  Patient location during evaluation: PACU Anesthesia Type: General Level of consciousness: awake and alert Pain management: pain level controlled Vital Signs Assessment: post-procedure vital signs reviewed and stable Respiratory status: spontaneous breathing, nonlabored ventilation, respiratory function stable and patient connected to nasal cannula oxygen Cardiovascular status: blood pressure returned to baseline and stable Postop Assessment: no apparent nausea or vomiting Anesthetic complications: no   No notable events documented.   Last Vitals:  Vitals:   11/27/21 1620 11/27/21 1626  BP:  103/90  Pulse: 88 99  Resp: 10 17  Temp: 36.7 C 36.6 C  SpO2: (!) 89% 94%    Last Pain:  Vitals:   11/28/21 0947  TempSrc:   PainSc: 10-Worst pain ever                 Martha Clan

## 2021-12-12 ENCOUNTER — Ambulatory Visit: Payer: Medicare Other | Admitting: Pulmonary Disease

## 2021-12-20 ENCOUNTER — Other Ambulatory Visit: Payer: Self-pay | Admitting: Family Medicine

## 2021-12-20 ENCOUNTER — Other Ambulatory Visit: Payer: Self-pay

## 2021-12-20 ENCOUNTER — Ambulatory Visit
Admission: RE | Admit: 2021-12-20 | Discharge: 2021-12-20 | Disposition: A | Discharge status: Home / Self Care | Payer: Medicare Other | Source: Ambulatory Visit | Attending: Family Medicine | Admitting: Family Medicine

## 2021-12-20 ENCOUNTER — Other Ambulatory Visit (HOSPITAL_COMMUNITY): Payer: Self-pay | Admitting: Family Medicine

## 2021-12-20 DIAGNOSIS — S0101XA Laceration without foreign body of scalp, initial encounter: Secondary | ICD-10-CM

## 2021-12-20 DIAGNOSIS — S0003XA Contusion of scalp, initial encounter: Secondary | ICD-10-CM | POA: Diagnosis not present

## 2021-12-20 DIAGNOSIS — S060XAA Concussion with loss of consciousness status unknown, initial encounter: Secondary | ICD-10-CM | POA: Insufficient documentation

## 2021-12-20 DIAGNOSIS — Y92009 Unspecified place in unspecified non-institutional (private) residence as the place of occurrence of the external cause: Secondary | ICD-10-CM

## 2021-12-20 DIAGNOSIS — G44319 Acute post-traumatic headache, not intractable: Secondary | ICD-10-CM

## 2021-12-20 DIAGNOSIS — W19XXXA Unspecified fall, initial encounter: Secondary | ICD-10-CM | POA: Insufficient documentation

## 2021-12-20 DIAGNOSIS — M542 Cervicalgia: Secondary | ICD-10-CM

## 2021-12-20 IMAGING — CT CT CERVICAL SPINE W/O CM
3 of 4 series · 11 of 33 positions shown, 13 images · non-contrast
Comparison: [DATE].

CLINICAL DATA: Posttraumatic headache and neck pain after fall at
home.



[Series 6: orthogonal bone · axial · 0.27mm/px · z∈[+216,+332]mm · 3 of 101 slices shown, 4 images]
[im 17/101  soft-tissue]
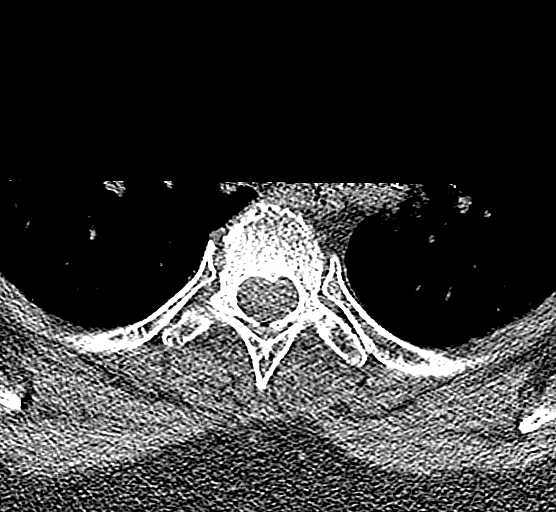
[im 17/101  bone]
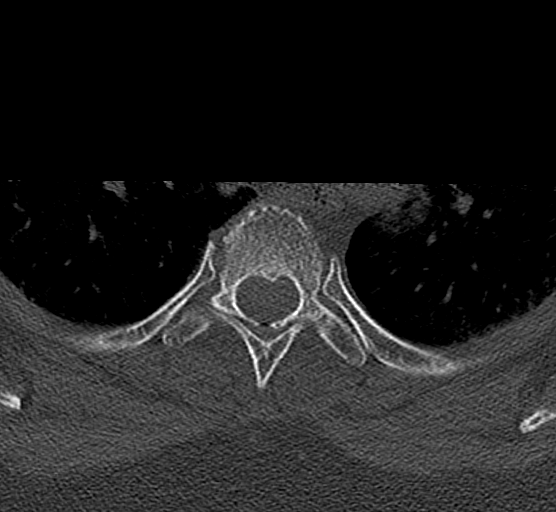
[im 51/101  bone]
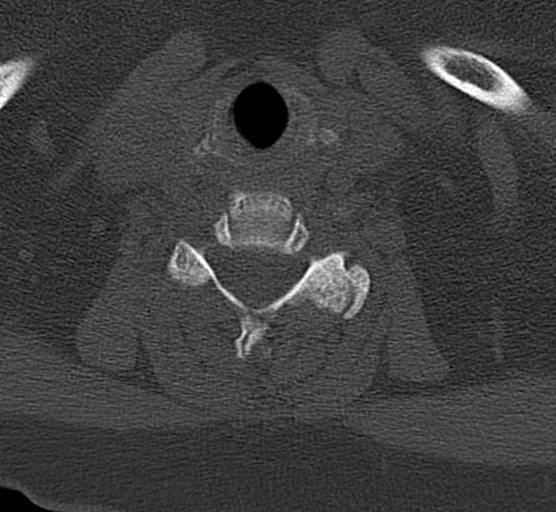
[im 84/101  bone]
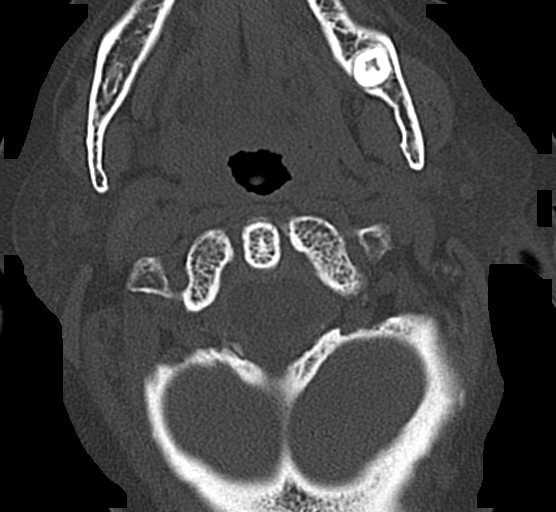

[Series 7: sagittal bone · sagittal · 0.26mm/px · 5 of 83 slices shown, 6 images]
[im 28/83  bone]
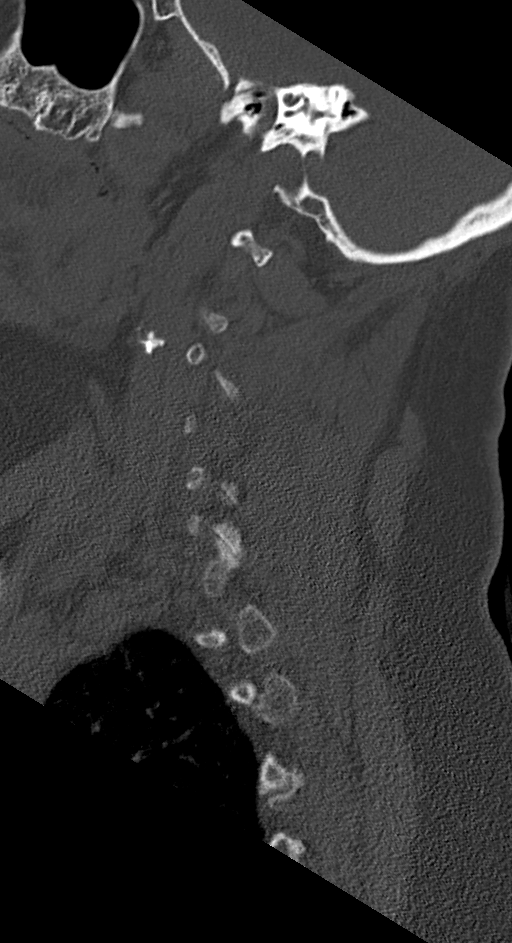
[im 35/83  bone]
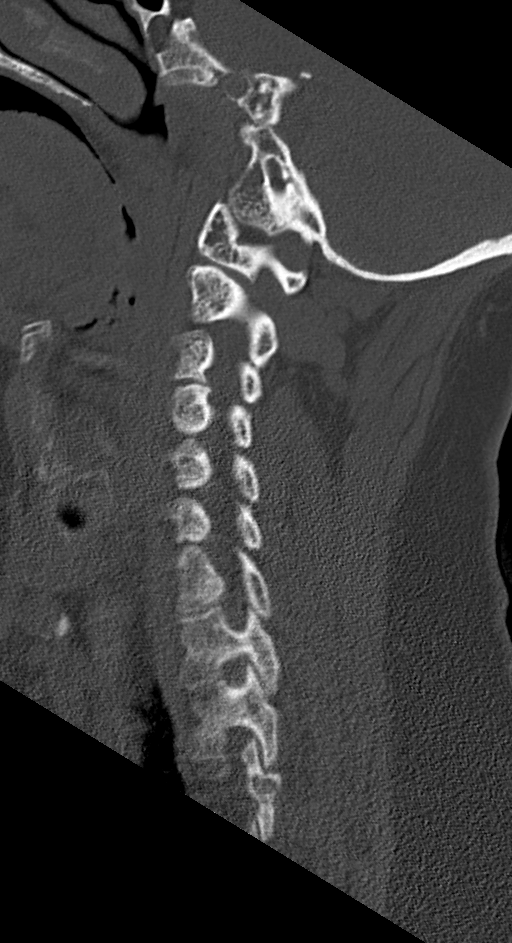
[im 42/83  soft-tissue]
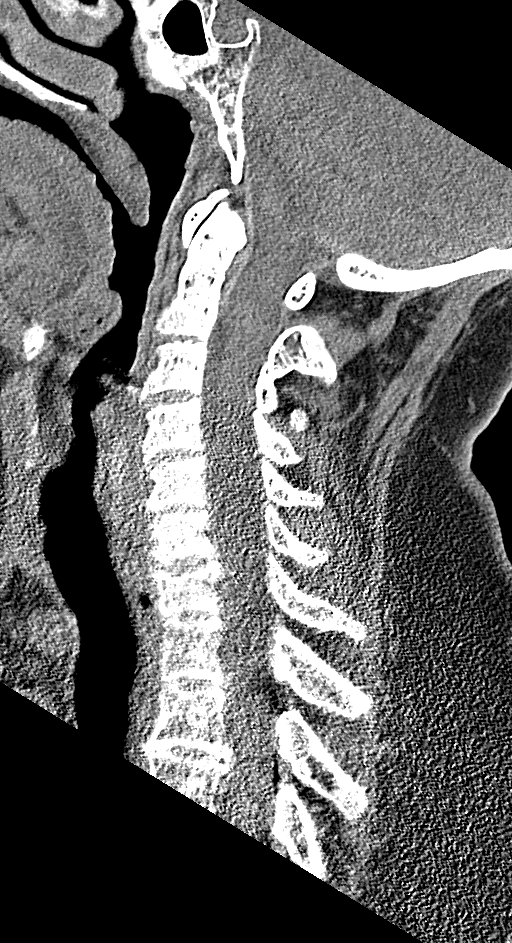
[im 42/83  bone]
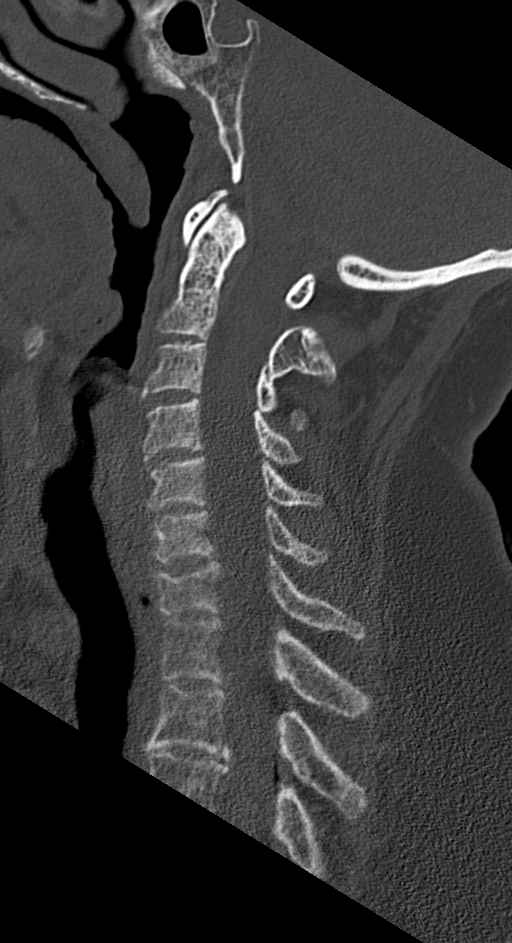
[im 48/83  bone]
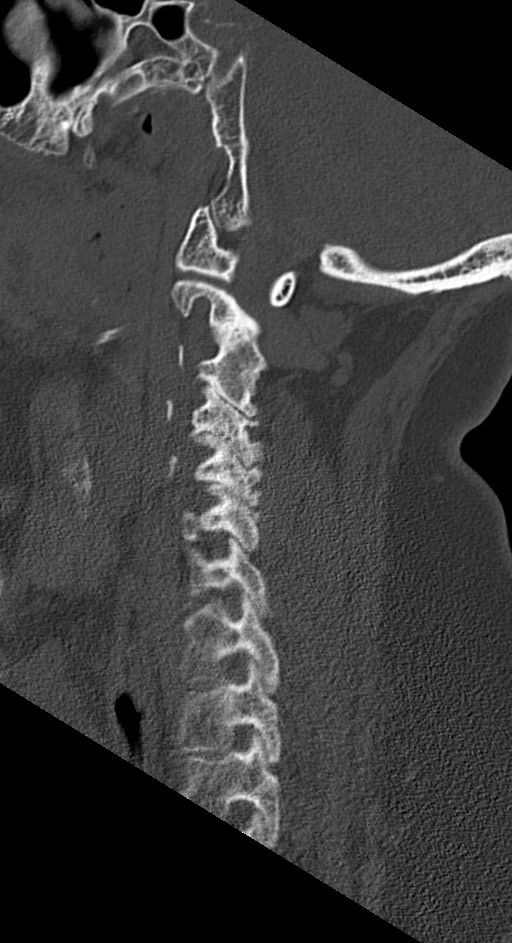
[im 55/83  bone]
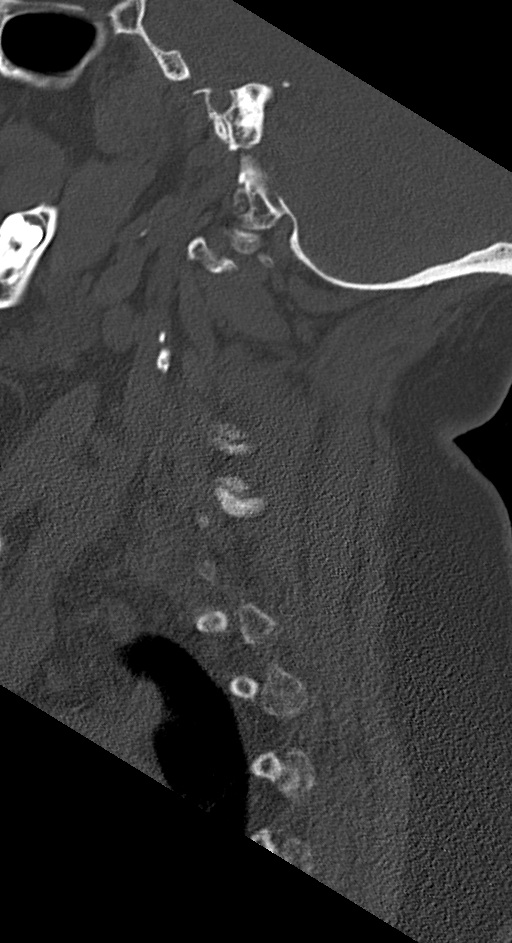

[Series 8: coronal bone · coronal · 0.31mm/px · 3 of 66 slices shown]
[im 20/66  bone]
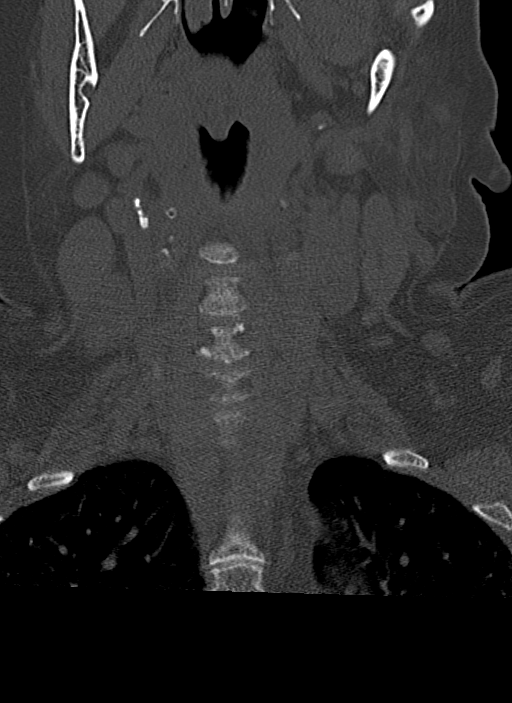
[im 29/66  bone]
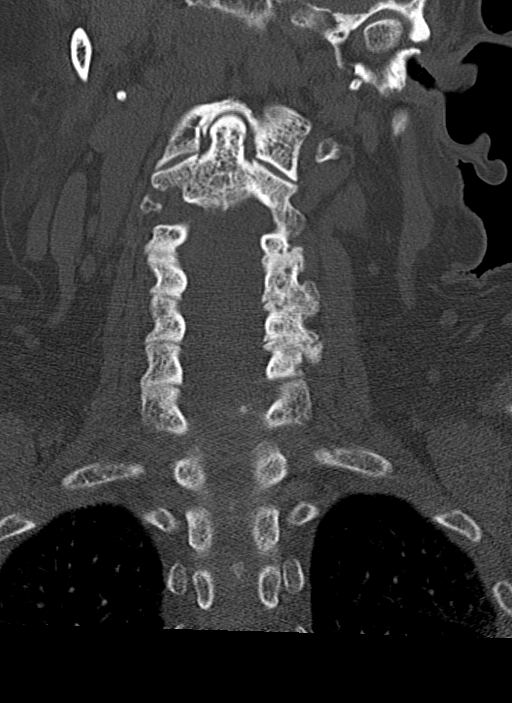
[im 37/66  bone]
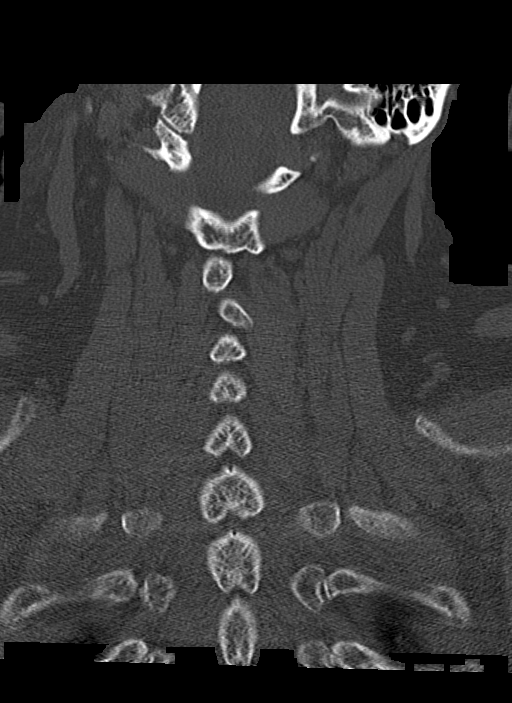

[11 of 33 positions shown; findings below may reference images not displayed]

FINDINGS: CT HEAD FINDINGS

Brain: No evidence of acute infarction, hemorrhage, hydrocephalus,
extra-axial collection or mass lesion/mass effect.

Vascular: No hyperdense vessel or unexpected calcification.

Skull: Normal. Negative for fracture or focal lesion.

Sinuses/Orbits: No acute finding.

Other: None.

CT CERVICAL SPINE FINDINGS

Alignment: Normal.

Skull base and vertebrae: No acute fracture. No primary bone lesion
or focal pathologic process.

Soft tissues and spinal canal: No prevertebral fluid or swelling. No
visible canal hematoma.

Disc levels: Mild anterior osteophyte formation is noted at C4-5,
C5-6 and C6-7.

Upper chest: Negative.

Other: Degenerative changes are seen involving the left-sided
posterior facet joints.
IMPRESSION: No acute intracranial abnormality seen.

Multilevel degenerative changes as described above. No acute
abnormality seen in the cervical spine.

## 2022-01-15 ENCOUNTER — Ambulatory Visit: Payer: Medicare Other | Admitting: Occupational Therapy

## 2022-02-11 ENCOUNTER — Encounter: Payer: Self-pay | Admitting: Student in an Organized Health Care Education/Training Program

## 2022-02-11 ENCOUNTER — Ambulatory Visit
Payer: Medicare Other | Attending: Student in an Organized Health Care Education/Training Program | Admitting: Student in an Organized Health Care Education/Training Program

## 2022-02-11 VITALS — BP 110/97 | HR 74 | Temp 96.9°F | Ht 62.0 in | Wt 192.0 lb

## 2022-02-11 DIAGNOSIS — G894 Chronic pain syndrome: Secondary | ICD-10-CM | POA: Diagnosis present

## 2022-02-11 DIAGNOSIS — F17200 Nicotine dependence, unspecified, uncomplicated: Secondary | ICD-10-CM | POA: Diagnosis present

## 2022-02-11 DIAGNOSIS — M47816 Spondylosis without myelopathy or radiculopathy, lumbar region: Secondary | ICD-10-CM | POA: Insufficient documentation

## 2022-02-11 DIAGNOSIS — G43709 Chronic migraine without aura, not intractable, without status migrainosus: Secondary | ICD-10-CM | POA: Diagnosis present

## 2022-02-11 DIAGNOSIS — M5136 Other intervertebral disc degeneration, lumbar region: Secondary | ICD-10-CM | POA: Diagnosis present

## 2022-02-11 DIAGNOSIS — F411 Generalized anxiety disorder: Secondary | ICD-10-CM | POA: Diagnosis present

## 2022-02-11 DIAGNOSIS — R413 Other amnesia: Secondary | ICD-10-CM | POA: Diagnosis present

## 2022-02-11 DIAGNOSIS — F331 Major depressive disorder, recurrent, moderate: Secondary | ICD-10-CM | POA: Diagnosis present

## 2022-02-11 NOTE — Patient Instructions (Signed)
______________________________________________________________________  Preparing for Procedure with Sedation  NOTICE: Due to recent regulatory changes, starting on June 10, 2021, procedures requiring intravenous (IV) sedation will no longer be performed at the [facility].  These types of procedures are required to be performed at [facility] ambulatory surgery facility.  We are very sorry for the inconvenience.  Procedure appointments are limited to planned procedures: No Prescription Refills. No disability issues will be discussed. No medication changes will be discussed.  Instructions: Oral Intake: Do not eat or drink anything for at least 8 hours prior to your procedure. (Exception: Blood Pressure Medication. See below.) Transportation: A driver is required. You may not drive yourself after the procedure. Blood Pressure Medicine: Do not forget to take your blood pressure medicine with a sip of water the morning of the procedure. If your Diastolic (lower reading) is above 100 mmHg, elective cases will be cancelled/rescheduled. Blood thinners: These will need to be stopped for procedures. Notify our staff if you are taking any blood thinners. Depending on which one you take, there will be specific instructions on how and when to stop it. Diabetics on insulin: Notify the staff so that you can be scheduled 1st case in the morning. If your diabetes requires high dose insulin, take only  of your normal insulin dose the morning of the procedure and notify the staff that you have done so. Preventing infections: Shower with an antibacterial soap the morning of your procedure. Build-up your immune system: Take 1000 mg of Vitamin C with every meal (3 times a day) the day prior to your procedure. Antibiotics: Inform the staff if you have a condition or reason that requires you to take antibiotics before dental procedures. Pregnancy: If you are pregnant, call and cancel the procedure. Sickness: If  you have a cold, fever, or any active infections, call and cancel the procedure. Arrival: You must be in the facility at least 30 minutes prior to your scheduled procedure. Children: Do not bring children with you. Dress appropriately: Bring dark clothing that you would not mind if they get stained. Valuables: Do not bring any jewelry or valuables.  Reasons to call and reschedule or cancel your procedure: (Following these recommendations will minimize the risk of a serious complication.) Surgeries: Avoid having procedures within 2 weeks of any surgery. (Avoid for 2 weeks before or after any surgery). Flu Shots: Avoid having procedures within 2 weeks of a flu shots. (Avoid for 2 weeks before or after immunizations). Barium: Avoid having a procedure within 7-10 days after having had a radiological study involving the use of radiological contrast. (Myelograms, Barium swallow or enema study). Heart attacks: Avoid any elective procedures or surgeries for the initial 6 months after a "Myocardial Infarction" (Heart Attack). Blood thinners: It is imperative that you stop these medications before procedures. Let us know if you if you take any blood thinner.  Infection: Avoid procedures during or within two weeks of an infection (including chest colds or gastrointestinal problems). Symptoms associated with infections include: Localized redness, fever, chills, night sweats or profuse sweating, burning sensation when voiding, cough, congestion, stuffiness, runny nose, sore throat, diarrhea, nausea, vomiting, cold or Flu symptoms, recent or current infections. It is specially important if the infection is over the area that we intend to treat. Heart and lung problems: Symptoms that may suggest an active cardiopulmonary problem include: cough, chest pain, breathing difficulties or shortness of breath, dizziness, ankle swelling, uncontrolled high or unusually low blood pressure, and/or palpitations. If you are    experiencing any of these symptoms, cancel your procedure and contact your primary care physician for an evaluation.  Remember:  Regular Business hours are:  Monday to Thursday 8:00 AM to 4:00 PM  Provider's Schedule: Francisco Naveira, MD:  Procedure days: Tuesday and Thursday 7:30 AM to 4:00 PM  Bilal Lateef, MD:  Procedure days: Monday and Wednesday 7:30 AM to 4:00 PM ______________________________________________________________________   
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
## 2022-02-11 NOTE — Progress Notes (Signed)
Safety precautions to be maintained throughout the outpatient stay will include: orient to surroundings, keep bed in low position, maintain call bell within reach at all times, provide assistance with transfer out of bed and ambulation.  

## 2022-03-10 ENCOUNTER — Ambulatory Visit
Admission: RE | Admit: 2022-03-10 | Discharge: 2022-03-10 | Disposition: A | Discharge status: Home / Self Care | Payer: Medicare Other | Source: Ambulatory Visit | Attending: Student in an Organized Health Care Education/Training Program | Admitting: Student in an Organized Health Care Education/Training Program

## 2022-03-10 ENCOUNTER — Ambulatory Visit (HOSPITAL_BASED_OUTPATIENT_CLINIC_OR_DEPARTMENT_OTHER): Payer: Medicare Other | Admitting: Student in an Organized Health Care Education/Training Program

## 2022-03-10 ENCOUNTER — Encounter: Payer: Self-pay | Admitting: Student in an Organized Health Care Education/Training Program

## 2022-03-10 ENCOUNTER — Other Ambulatory Visit: Payer: Self-pay

## 2022-03-10 VITALS — BP 101/52 | HR 84 | Temp 97.0°F | Resp 17 | Ht 63.0 in | Wt 192.0 lb

## 2022-03-10 DIAGNOSIS — M47816 Spondylosis without myelopathy or radiculopathy, lumbar region: Secondary | ICD-10-CM | POA: Diagnosis present

## 2022-03-10 DIAGNOSIS — G894 Chronic pain syndrome: Secondary | ICD-10-CM | POA: Insufficient documentation

## 2022-03-10 DIAGNOSIS — M5136 Other intervertebral disc degeneration, lumbar region: Secondary | ICD-10-CM | POA: Diagnosis present

## 2022-03-10 MED ORDER — DEXAMETHASONE SODIUM PHOSPHATE 10 MG/ML IJ SOLN
10.0000 mg | Freq: Once | INTRAMUSCULAR | Status: AC
  Administered 2022-03-10: 10 mg

## 2022-03-10 MED ORDER — MIDAZOLAM HCL 5 MG/5ML IJ SOLN
0.5000 mg | Freq: Once | INTRAMUSCULAR | Status: AC
  Administered 2022-03-10: 1 mg via INTRAVENOUS

## 2022-03-10 MED ORDER — ROPIVACAINE HCL 2 MG/ML IJ SOLN
INTRAMUSCULAR | Status: AC
  Filled 2022-03-10: qty 20

## 2022-03-10 MED ORDER — ROPIVACAINE HCL 2 MG/ML IJ SOLN
9.0000 mL | Freq: Once | INTRAMUSCULAR | Status: AC
  Administered 2022-03-10: 9 mL via PERINEURAL

## 2022-03-10 MED ORDER — MIDAZOLAM HCL 5 MG/5ML IJ SOLN
INTRAMUSCULAR | Status: AC
  Filled 2022-03-10: qty 5

## 2022-03-10 MED ORDER — LIDOCAINE HCL (PF) 2 % IJ SOLN
INTRAMUSCULAR | Status: AC
  Filled 2022-03-10: qty 15

## 2022-03-10 MED ORDER — DEXAMETHASONE SODIUM PHOSPHATE 10 MG/ML IJ SOLN
INTRAMUSCULAR | Status: AC
  Filled 2022-03-10: qty 2

## 2022-03-10 MED ORDER — LIDOCAINE HCL (PF) 2 % IJ SOLN
INTRAMUSCULAR | Status: AC
  Filled 2022-03-10: qty 5

## 2022-03-10 MED ORDER — FENTANYL CITRATE (PF) 100 MCG/2ML IJ SOLN
25.0000 ug | INTRAMUSCULAR | Status: DC | PRN
  Administered 2022-03-10: 100 ug via INTRAVENOUS

## 2022-03-10 MED ORDER — FENTANYL CITRATE (PF) 100 MCG/2ML IJ SOLN
INTRAMUSCULAR | Status: AC
  Filled 2022-03-10: qty 2

## 2022-03-10 MED ORDER — LIDOCAINE HCL 2 % IJ SOLN
20.0000 mL | Freq: Once | INTRAMUSCULAR | Status: AC
  Administered 2022-03-10: 400 mg

## 2022-03-10 NOTE — Patient Instructions (Signed)

## 2022-03-10 NOTE — Progress Notes (Signed)
Safety precautions to be maintained throughout the outpatient stay will include: orient to surroundings, keep bed in low position, maintain call bell within reach at all times, provide assistance with transfer out of bed and ambulation.  

## 2022-03-10 NOTE — Progress Notes (Signed)
PROVIDER NOTE: Interpretation of information contained herein should be left to medically-trained personnel. Specific patient instructions are provided elsewhere under "Patient Instructions" section of medical record. This document was created in part using STT-dictation technology, any transcriptional errors that may result from this process are unintentional.  ?Patient: Sally Hughes ?Type: Established ?DOB: 29-Sep-1961 ?MRN: [id number] ?PCP: Gladstone Lighter, MD  Service: Procedure ?DOS: 03/10/2022 ?Setting: Ambulatory ?Location: Ambulatory outpatient facility ?Delivery: Face-to-face Provider: Gillis Santa, MD ?Specialty: Interventional Pain Management ?Specialty designation: 09 ?Location: Outpatient facility ?Ref. Prov.: Gladstone Lighter, MD   ? ?Primary Reason for Visit: Interventional Pain Management Treatment. ?CC: Back Pain (Lumbar bilateral ) ?  ?Procedure:          ? Type: Lumbar Facet, Medial Branch Block(s) #1  ?Laterality: Bilateral  ?Level: L3, L4, L5,  Medial Branch Level(s). Injecting these levels blocks the L3-4 and L5-S1 lumbar facet joints. ?Imaging: Fluoroscopic guidance ?Anesthesia: Local anesthesia (1-2% Lidocaine) ?Sedation: Moderate conscious sedation. ?DOS: 03/10/2022 ?Performed by: Gillis Santa, MD ? ?Primary Purpose: Diagnostic/Therapeutic ?Indications: Low back pain severe enough to impact quality of life or function. ?1. Lumbar facet arthropathy   ?2. Lumbar spondylosis   ?3. Lumbar degenerative disc disease   ?4. Chronic pain syndrome   ? ?NAS-11 Pain score:  ? Pre-procedure: 8 /10  ? Post-procedure: 0-No pain/10  ? ?  ?Position / Prep / Materials:  ?Position: Prone  ?Prep solution: DuraPrep (Iodine Povacrylex [0.7% available iodine] and Isopropyl Alcohol, 74% w/w) ?Area Prepped: Posterolateral Lumbosacral Spine (Wide prep: From the lower border of the scapula down to the end of the tailbone and from flank to flank.) ? ?Materials:  ?Tray: Block ?Needle(s):  ?Type: Spinal  ?Gauge  (G): 22  ?Length: 3.5-in ?Qty: 2 ? ?Pre-op H&P Assessment:  ?Ms. Sally Hughes is a 61 y.o. (year old), female patient, seen today for interventional treatment. She  has a past surgical history that includes Tumor removal; Tubal ligation; Cataract extraction w/PHACO (Left, 04/19/2018); Cataract extraction w/PHACO (Right, 05/19/2018); Microlaryngoscopy (Right, 01/13/2020); XI robotic assisted ventral hernia (N/A, 06/13/2020); and Carpometacarpal (cmc) fusion of thumb (Left, 11/27/2021). Ms. Sally Hughes has a current medication list which includes the following prescription(s): albuterol, amitriptyline, apple cider vinegar, true metrix meter, celecoxib, cyanocobalamin, cyclobenzaprine, doxycycline, hydrocodone-acetaminophen, hydroxyzine, meclizine, metformin, metoprolol tartrate, omega-3 fatty acids, omeprazole, pharmacist choice lancets, pravastatin, pregabalin, ropinirole, true metrix blood glucose test, venlafaxine xr, vitamin d, vitamin e, and mometasone-formoterol, and the following Facility-Administered Medications: fentanyl. Her primarily concern today is the Back Pain (Lumbar bilateral ) ? ?Initial Vital Signs:  ?Pulse/HCG Rate: 84ECG Heart Rate: 69 ?Temp:  ?(!) 97 ?F (36.1 ?C) ?Resp: 16 ?BP:  ?(!) 143/76 ?SpO2: 100 % ? ?BMI: Estimated body mass index is 34.01 kg/m? as calculated from the following: ?  Height as of this encounter: '5\' 3"'$  (1.6 m). ?  Weight as of this encounter: 192 lb (87.1 kg). ? ?Risk Assessment: ?Allergies: Reviewed. She is allergic to buspirone and gabapentin.  ?Allergy Precautions: None required ?Coagulopathies: Reviewed. None identified.  ?Blood-thinner therapy: None at this time ?Active Infection(s): Reviewed. None identified. Sally Hughes is afebrile ? ?Site Confirmation: Sally Hughes was asked to confirm the procedure and laterality before marking the site ?Procedure checklist: Completed ?Consent: Before the procedure and under the influence of no sedative(s), amnesic(s), or anxiolytics, the patient was  informed of the treatment options, risks and possible complications. To fulfill our ethical and legal obligations, as recommended by the American Medical Association's Code of Ethics, I have informed the patient of my  clinical impression; the nature and purpose of the treatment or procedure; the risks, benefits, and possible complications of the intervention; the alternatives, including doing nothing; the risk(s) and benefit(s) of the alternative treatment(s) or procedure(s); and the risk(s) and benefit(s) of doing nothing. ?The patient was provided information about the general risks and possible complications associated with the procedure. These may include, but are not limited to: failure to achieve desired goals, infection, bleeding, organ or nerve damage, allergic reactions, paralysis, and death. ?In addition, the patient was informed of those risks and complications associated to Spine-related procedures, such as failure to decrease pain; infection (i.e.: Meningitis, epidural or intraspinal abscess); bleeding (i.e.: epidural hematoma, subarachnoid hemorrhage, or any other type of intraspinal or peri-dural bleeding); organ or nerve damage (i.e.: Any type of peripheral nerve, nerve root, or spinal cord injury) with subsequent damage to sensory, motor, and/or autonomic systems, resulting in permanent pain, numbness, and/or weakness of one or several areas of the body; allergic reactions; (i.e.: anaphylactic reaction); and/or death. ?Furthermore, the patient was informed of those risks and complications associated with the medications. These include, but are not limited to: allergic reactions (i.e.: anaphylactic or anaphylactoid reaction(s)); adrenal axis suppression; blood sugar elevation that in diabetics may result in ketoacidosis or comma; water retention that in patients with history of congestive heart failure may result in shortness of breath, pulmonary edema, and decompensation with resultant heart  failure; weight gain; swelling or edema; medication-induced neural toxicity; particulate matter embolism and blood vessel occlusion with resultant organ, and/or nervous system infarction; and/or aseptic necrosis of one or more joints. ?Finally, the patient was informed that Medicine is not an exact science; therefore, there is also the possibility of unforeseen or unpredictable risks and/or possible complications that may result in a catastrophic outcome. The patient indicated having understood very clearly. We have given the patient no guarantees and we have made no promises. Enough time was given to the patient to ask questions, all of which were answered to the patient's satisfaction. Ms. Maskell has indicated that she wanted to continue with the procedure. ?Attestation: I, the ordering provider, attest that I have discussed with the patient the benefits, risks, side-effects, alternatives, likelihood of achieving goals, and potential problems during recovery for the procedure that I have provided informed consent. ?Date  Time: 03/10/2022  8:28 AM ? ?Pre-Procedure Preparation:  ?Monitoring: As per clinic protocol. Respiration, ETCO2, SpO2, BP, heart rate and rhythm monitor placed and checked for adequate function ?Safety Precautions: Patient was assessed for positional comfort and pressure points before starting the procedure. ?Time-out: I initiated and conducted the "Time-out" before starting the procedure, as per protocol. The patient was asked to participate by confirming the accuracy of the "Time Out" information. Verification of the correct person, site, and procedure were performed and confirmed by me, the nursing staff, and the patient. "Time-out" conducted as per Joint Commission's Universal Protocol (UP.01.01.01). ?Time: 0912 ? ?Description of Procedure:          ?Laterality: Bilateral. The procedure was performed in identical fashion on both sides. ?Targeted Levels:  L3, L4, L5,  Medial Branch  Level(s) ? ?Safety Precautions: Aspiration looking for blood return was conducted prior to all injections. At no point did we inject any substances, as a needle was being advanced. Before injecting, the patient was told to imm

## 2022-03-11 ENCOUNTER — Telehealth: Payer: Self-pay

## 2022-03-11 NOTE — Telephone Encounter (Signed)
Post procedure phone follow up.  Patient states she is doing well.  ?

## 2022-04-14 ENCOUNTER — Ambulatory Visit
Payer: Medicare Other | Attending: Student in an Organized Health Care Education/Training Program | Admitting: Student in an Organized Health Care Education/Training Program

## 2022-04-14 ENCOUNTER — Encounter: Payer: Self-pay | Admitting: Student in an Organized Health Care Education/Training Program

## 2022-04-14 DIAGNOSIS — M47816 Spondylosis without myelopathy or radiculopathy, lumbar region: Secondary | ICD-10-CM | POA: Diagnosis not present

## 2022-04-14 DIAGNOSIS — M5136 Other intervertebral disc degeneration, lumbar region: Secondary | ICD-10-CM

## 2022-04-14 DIAGNOSIS — G894 Chronic pain syndrome: Secondary | ICD-10-CM | POA: Diagnosis not present

## 2022-04-14 NOTE — Assessment & Plan Note (Signed)
Great response to first set of diagnostic lumbar facet medial branch nerve block.  Was endorsing 100% pain relief for the first 2 weeks until she fell on her driveway which was wet.  She is having increased low back pain but states that it is still better than before her block.  We discussed repeating diagnostic block #2 and then considering radiofrequency ablation for the purpose of obtaining longer-term pain relief.  Risks and benefits reviewed and patient would like to proceed.  We will plan on doing this with moderate sedation as we did initially.

## 2022-04-14 NOTE — Progress Notes (Signed)
Patient: Sally Hughes  Service Category: E/M  Provider: Gillis Santa, MD  DOB: 19-Oct-1961  DOS: 04/14/2022  Location: Office  MRN: [id number]  Setting: Ambulatory outpatient  Referring Provider: Gladstone Lighter, MD  Type: Established Patient  Specialty: Interventional Pain Management  PCP: Sally Lighter, MD  Location: Remote location  Delivery: TeleHealth     Virtual Encounter - Pain Management PROVIDER NOTE: Information contained herein reflects review and annotations entered in association with encounter. Interpretation of such information and data should be left to medically-trained personnel. Information provided to patient can be located elsewhere in the medical record under "Patient Instructions". Document created using STT-dictation technology, any transcriptional errors that may result from process are unintentional.    Contact & Pharmacy Preferred: [phone] Home: [phone] (home) Mobile: There is no such number on file (mobile). E-mail: No e-mail address on record  [id number] [place] - [address] [place]: [phone]: [phone] [place] [address]Phone: [phone]: [phone]  Pre-screening  Ms. MBordelonoffered "in-person" vs "virtual" encounter. She indicated preferring virtual for this encounter.   Reason COVID-19*  Social distancing based on CDC and AMA recommendations.   I contacted Sally Bathon 04/14/2022 via telephone.      I clearly identified myself as BGillis Santa MD. I verified that I was speaking with the correct person using two identifiers (Name: Sally Hughes and date of birth: 1Apr 19, 1962.  Consent I sought verbal advanced consent from Sally Bathfor virtual visit interactions. I informed Sally Hughes possible security and privacy concerns, risks,  and limitations associated with providing "not-in-person" medical evaluation and management services. I also informed Sally Hughes the availability of "in-person" appointments. Finally, I informed her that there would be a charge for the virtual visit and that she could be  personally, fully or partially, financially responsible for it. Ms. MRussmanexpressed understanding and agreed to proceed.   Historic Elements   Ms. EIVAH GIRARDOTis a 61y.o. year old, female patient evaluated today after our last contact on 03/10/2022. Sally Hughes a past medical history of Arthritis, Asthma, Back pain, Bell's palsy, COPD (chronic obstructive pulmonary disease) ([place], Depression, Diabetes mellitus, type 2 ([place], Dyspnea, Family history of adverse reaction to anesthesia, GERD (gastroesophageal reflux disease), Headache, Hepatitis C, Hypertension, Kidney tumor (benign), left, Pneumonia, and Snake bite. She also  Hughes a past surgical history that includes Tumor removal; Tubal ligation; Cataract extraction w/PHACO (Left, 04/19/2018); Cataract extraction w/PHACO (Right, 05/19/2018); Microlaryngoscopy (Right, 01/13/2020); XI robotic assisted ventral hernia (N/A, 06/13/2020); and Carpometacarpal (cmc) fusion of thumb (Left, 11/27/2021). Ms. MBelgravehas a current medication list which includes the following prescription(s): albuterol, amitriptyline, amitriptyline, apple cider vinegar, true metrix meter, celecoxib, cyanocobalamin, cyclobenzaprine, cyclobenzaprine, doxycycline, hydroxyzine, meclizine, meloxicam, metformin, metoprolol tartrate, mometasone-formoterol, omega-3 fatty acids, omeprazole, pharmacist choice lancets, pravastatin, ropinirole, true metrix blood glucose test, venlafaxine xr, vitamin d, vitamin e, hydrocodone-acetaminophen, and pregabalin. She  reports that she Hughes been smoking cigarettes. She Hughes a 30.00 pack-year smoking history. She Hughes never used smokeless tobacco. She reports that she does not currently use  drugs after having used the following drugs: Marijuana. She reports that she does not drink alcohol. Sally Hughes allergic to buspirone, gabapentin, and pregabalin.   HPI  Today, she is being contacted for a post-procedure  assessment.   Post-procedure evaluation   Type: Lumbar Facet, Medial Branch Block(s) #1  Laterality: Bilateral  Level: L3, L4, L5,  Medial Branch Level(s). Injecting these levels blocks the L3-4 and L5-S1 lumbar facet joints. Imaging: Fluoroscopic guidance Anesthesia: Local anesthesia (1-2% Lidocaine) Sedation: Moderate conscious sedation. DOS: 03/10/2022 Performed by: Sally Santa, MD  Primary Purpose: Diagnostic/Therapeutic Indications: Low back pain severe enough to impact quality of life or function. 1. Lumbar facet arthropathy   2. Lumbar spondylosis   3. Lumbar degenerative disc disease   4. Chronic pain syndrome    NAS-11 Pain score:   Pre-procedure: 8 /10   Post-procedure: 0-No pain/10      Effectiveness:  Initial hour after procedure: 100 %  Subsequent 4-6 hours post-procedure: 100 %  Analgesia past initial 6 hours: 100 % (first 2 1/2 weeks she was pain free.  then she fell outside and she began hurting again but not at the same severity.)  Ongoing improvement:  Analgesic:  100% for 2 weeks then she fell outside due to wet walkway Function: Sally Hughes reports improvement in function ROM: Sally Hughes reports improvement in ROM   Laboratory Chemistry Profile   Renal Lab Results  Component Value Date   BUN 15 05/20/2021   CREATININE 0.94 05/20/2021   GFRAA 43 (L) 05/25/2019   GFRNONAA >60 05/20/2021    Hepatic Lab Results  Component Value Date   AST 17 09/09/2018   ALT 11 09/09/2018   ALBUMIN 3.6 09/09/2018   ALKPHOS 92 09/09/2018    Electrolytes Lab Results  Component Value Date   NA 135 05/20/2021   K 5.4 (H) 05/20/2021   CL 100 05/20/2021   CALCIUM 9.0 05/20/2021    Bone No results found for: VD25OH, VD125OH2TOT, OZ3664QI3,  KV4259DG3, 25OHVITD1, 25OHVITD2, 25OHVITD3, TESTOFREE, TESTOSTERONE  Inflammation (CRP: Acute Phase) (ESR: Chronic Phase) No results found for: CRP, ESRSEDRATE, LATICACIDVEN       Note: Above Lab results reviewed.   The primary encounter diagnosis was Lumbar facet arthropathy. Diagnoses of Lumbar spondylosis, Lumbar degenerative disc disease, and Chronic pain syndrome were also pertinent to this visit.  Plan of Care  Problem-specific:  Lumbar spondylosis Great response to first set of diagnostic lumbar facet medial branch nerve block.  Was endorsing 100% pain relief for the first 2 weeks until she fell on her driveway which was wet.  She is having increased low back pain but states that it is still better than before her block.  We discussed repeating diagnostic block #2 and then considering radiofrequency ablation for the purpose of obtaining longer-term pain relief.  Risks and benefits reviewed and patient would like to proceed.  We will plan on doing this with moderate sedation as we did initially.  Ms. SKARLETT SEDLACEK Hughes a current medication list which includes the following long-term medication(s): albuterol, amitriptyline, amitriptyline, metformin, metoprolol tartrate, mometasone-formoterol, omeprazole, pravastatin, ropinirole, and pregabalin.  Defer medication management to PCP. Focus on interventional pain therapies.   Orders:  Orders Placed This Encounter  Procedures   LUMBAR FACET(MEDIAL BRANCH NERVE BLOCK) MBNB    Standing Status:   Future    Standing Expiration Date:   05/14/2022    Scheduling Instructions:     Procedure: Lumbar facet block (AKA.: Lumbosacral medial branch nerve block)     Side: Bilateral     Level: L3-4, L4-5, & L5-S1 Facets (L3, L4, L5,Medial Branch Nerves)     Sedation: moderate     Timeframe: ASAA    Order Specific  Question:   Where will this procedure be performed?    Answer:   [facility]   Follow-up plan:   Return in about 2 weeks  (around 04/28/2022) for B/L L3, 4, 5 MBNB #2, ECT.    Recent Visits Date Type Provider Dept  03/10/22 Procedure visit Sally Santa, MD [facility]  02/11/22 Office Visit Sally Santa, MD [facility]  Showing recent visits within past 90 days and meeting all other requirements Today's Visits Date Type Provider Dept  04/14/22 Office Visit Sally Santa, MD [facility]  Showing today's visits and meeting all other requirements Future Appointments No visits were found meeting these conditions. Showing future appointments within next 90 days and meeting all other requirements  I discussed the assessment and treatment plan with the patient. The patient was provided an opportunity to ask questions and all were answered. The patient agreed with the plan and demonstrated an understanding of the instructions.  Patient advised to call back or seek an in-person evaluation if the symptoms or condition worsens.  Duration of encounter: 29mnutes.  Note by: BGillis Santa MD Date: 04/14/2022; Time: 9:15 AM

## 2022-04-30 ENCOUNTER — Ambulatory Visit
Admission: RE | Admit: 2022-04-30 | Discharge: 2022-04-30 | Disposition: A | Discharge status: Home / Self Care | Payer: Medicare Other | Source: Ambulatory Visit | Attending: Student in an Organized Health Care Education/Training Program | Admitting: Student in an Organized Health Care Education/Training Program

## 2022-04-30 ENCOUNTER — Encounter: Payer: Self-pay | Admitting: Student in an Organized Health Care Education/Training Program

## 2022-04-30 ENCOUNTER — Ambulatory Visit
Payer: Medicare Other | Attending: Student in an Organized Health Care Education/Training Program | Admitting: Student in an Organized Health Care Education/Training Program

## 2022-04-30 VITALS — BP 95/71 | HR 91 | Temp 96.3°F | Resp 14 | Ht 63.0 in | Wt 192.0 lb

## 2022-04-30 DIAGNOSIS — M47816 Spondylosis without myelopathy or radiculopathy, lumbar region: Secondary | ICD-10-CM | POA: Insufficient documentation

## 2022-04-30 DIAGNOSIS — Z8719 Personal history of other diseases of the digestive system: Secondary | ICD-10-CM | POA: Diagnosis not present

## 2022-04-30 DIAGNOSIS — Z7951 Long term (current) use of inhaled steroids: Secondary | ICD-10-CM | POA: Insufficient documentation

## 2022-04-30 DIAGNOSIS — Z6834 Body mass index (BMI) 34.0-34.9, adult: Secondary | ICD-10-CM | POA: Diagnosis not present

## 2022-04-30 DIAGNOSIS — M545 Low back pain, unspecified: Secondary | ICD-10-CM | POA: Insufficient documentation

## 2022-04-30 DIAGNOSIS — Z9841 Cataract extraction status, right eye: Secondary | ICD-10-CM | POA: Insufficient documentation

## 2022-04-30 DIAGNOSIS — Z79899 Other long term (current) drug therapy: Secondary | ICD-10-CM | POA: Diagnosis not present

## 2022-04-30 DIAGNOSIS — Z9842 Cataract extraction status, left eye: Secondary | ICD-10-CM | POA: Diagnosis not present

## 2022-04-30 DIAGNOSIS — G894 Chronic pain syndrome: Secondary | ICD-10-CM | POA: Diagnosis present

## 2022-04-30 DIAGNOSIS — Z961 Presence of intraocular lens: Secondary | ICD-10-CM | POA: Diagnosis not present

## 2022-04-30 DIAGNOSIS — Z79891 Long term (current) use of opiate analgesic: Secondary | ICD-10-CM | POA: Diagnosis not present

## 2022-04-30 DIAGNOSIS — Z9889 Other specified postprocedural states: Secondary | ICD-10-CM | POA: Insufficient documentation

## 2022-04-30 DIAGNOSIS — Z7984 Long term (current) use of oral hypoglycemic drugs: Secondary | ICD-10-CM | POA: Insufficient documentation

## 2022-04-30 DIAGNOSIS — Z9851 Tubal ligation status: Secondary | ICD-10-CM | POA: Insufficient documentation

## 2022-04-30 MED ORDER — MIDAZOLAM HCL 5 MG/5ML IJ SOLN
0.5000 mg | Freq: Once | INTRAMUSCULAR | Status: AC
  Administered 2022-04-30: 1 mg via INTRAVENOUS

## 2022-04-30 MED ORDER — LIDOCAINE HCL 2 % IJ SOLN
INTRAMUSCULAR | Status: AC
  Filled 2022-04-30: qty 20

## 2022-04-30 MED ORDER — DEXAMETHASONE SODIUM PHOSPHATE 10 MG/ML IJ SOLN
10.0000 mg | Freq: Once | INTRAMUSCULAR | Status: AC
  Administered 2022-04-30: 10 mg

## 2022-04-30 MED ORDER — LIDOCAINE HCL 2 % IJ SOLN
20.0000 mL | Freq: Once | INTRAMUSCULAR | Status: AC
  Administered 2022-04-30: 400 mg

## 2022-04-30 MED ORDER — DEXAMETHASONE SODIUM PHOSPHATE 10 MG/ML IJ SOLN
INTRAMUSCULAR | Status: AC
  Filled 2022-04-30: qty 1

## 2022-04-30 MED ORDER — ROPIVACAINE HCL 2 MG/ML IJ SOLN
9.0000 mL | Freq: Once | INTRAMUSCULAR | Status: AC
  Administered 2022-04-30: 9 mL via PERINEURAL

## 2022-04-30 MED ORDER — ROPIVACAINE HCL 2 MG/ML IJ SOLN
INTRAMUSCULAR | Status: AC
  Filled 2022-04-30: qty 20

## 2022-04-30 MED ORDER — FENTANYL CITRATE (PF) 100 MCG/2ML IJ SOLN
25.0000 ug | INTRAMUSCULAR | Status: DC | PRN
  Administered 2022-04-30: 50 ug via INTRAVENOUS

## 2022-04-30 MED ORDER — MIDAZOLAM HCL 5 MG/5ML IJ SOLN
INTRAMUSCULAR | Status: AC
  Filled 2022-04-30: qty 5

## 2022-04-30 MED ORDER — FENTANYL CITRATE (PF) 100 MCG/2ML IJ SOLN
INTRAMUSCULAR | Status: AC
  Filled 2022-04-30: qty 2

## 2022-04-30 NOTE — Progress Notes (Signed)
Safety precautions to be maintained throughout the outpatient stay will include: orient to surroundings, keep bed in low position, maintain call bell within reach at all times, provide assistance with transfer out of bed and ambulation.  

## 2022-04-30 NOTE — Patient Instructions (Signed)

## 2022-04-30 NOTE — Progress Notes (Signed)
PROVIDER NOTE: Interpretation of information contained herein should be left to medically-trained personnel. Specific patient instructions are provided elsewhere under "Patient Instructions" section of medical record. This document was created in part using STT-dictation technology, any transcriptional errors that may result from this process are unintentional.  Patient: Sally Hughes Type: Established DOB: Sep 05, 1961 MRN: [id number] PCP: Gladstone Lighter, MD  Service: Procedure DOS: 04/30/2022 Setting: Ambulatory Location: Ambulatory outpatient facility Delivery: Face-to-face Provider: Gillis Santa, MD Specialty: Interventional Pain Management Specialty designation: 09 Location: Outpatient facility Ref. Prov.: Gladstone Lighter, MD    Primary Reason for Visit: Interventional Pain Management Treatment. CC: Back Pain (lower)   Procedure:           Type: Lumbar Facet, Medial Branch Block(s) #2  Laterality: Bilateral  Level: L3, L4, L5,  Medial Branch Level(s). Injecting these levels blocks the L3-4 and L5-S1 lumbar facet joints. Imaging: Fluoroscopic guidance Anesthesia: Local anesthesia (1-2% Lidocaine) Sedation: Moderate conscious sedation. DOS: 04/30/2022 Performed by: Gillis Santa, MD  Primary Purpose: Diagnostic/Therapeutic Indications: Low back pain severe enough to impact quality of life or function. 1. Lumbar facet arthropathy   2. Lumbar spondylosis   3. Chronic pain syndrome    NAS-11 Pain score:   Pre-procedure: 7 /10   Post-procedure: 0-No pain/10     Position / Prep / Materials:  Position: Prone  Prep solution: DuraPrep (Iodine Povacrylex [0.7% available iodine] and Isopropyl Alcohol, 74% w/w) Area Prepped: Posterolateral Lumbosacral Spine (Wide prep: From the lower border of the scapula down to the end of the tailbone and from flank to flank.)  Materials:  Tray: Block Needle(s):  Type: Spinal  Gauge (G): 22  Length: 3.5-in Qty: 2  Pre-op H&P  Assessment:  Sally Hughes is a 61 y.o. (year old), female patient, seen today for interventional treatment. She  has a past surgical history that includes Tumor removal; Tubal ligation; Cataract extraction w/PHACO (Left, 04/19/2018); Cataract extraction w/PHACO (Right, 05/19/2018); Microlaryngoscopy (Right, 01/13/2020); XI robotic assisted ventral hernia (N/A, 06/13/2020); and Carpometacarpal (cmc) fusion of thumb (Left, 11/27/2021). Sally Hughes has a current medication list which includes the following prescription(s): albuterol, amitriptyline, amitriptyline, apple cider vinegar, true metrix meter, celecoxib, cyanocobalamin, cyclobenzaprine, cyclobenzaprine, doxycycline, hydrocodone-acetaminophen, hydroxyzine, meclizine, meloxicam, metformin, metoprolol tartrate, omega-3 fatty acids, omeprazole, pharmacist choice lancets, pravastatin, ropinirole, true metrix blood glucose test, venlafaxine xr, vitamin d, vitamin e, mometasone-formoterol, and pregabalin, and the following Facility-Administered Medications: fentanyl. Her primarily concern today is the Back Pain (lower)  Initial Vital Signs:  Pulse/HCG Rate: 91ECG Heart Rate: 87 Temp:  (!) 97.5 F (36.4 C) Resp: 16 BP:  92/62 SpO2: 97 %  BMI: Estimated body mass index is 34.01 kg/m as calculated from the following:   Height as of this encounter: '5\' 3"'$  (1.6 m).   Weight as of this encounter: 192 lb (87.1 kg).  Risk Assessment: Allergies: Reviewed. She is allergic to buspirone, gabapentin, and pregabalin.  Allergy Precautions: None required Coagulopathies: Reviewed. None identified.  Blood-thinner therapy: None at this time Active Infection(s): Reviewed. None identified. Sally Hughes is afebrile  Site Confirmation: Sally Hughes was asked to confirm the procedure and laterality before marking the site Procedure checklist: Completed Consent: Before the procedure and under the influence of no sedative(s), amnesic(s), or anxiolytics, the patient was informed of  the treatment options, risks and possible complications. To fulfill our ethical and legal obligations, as recommended by the American Medical Association's Code of Ethics, I have informed the patient of my clinical impression; the nature and purpose of the  treatment or procedure; the risks, benefits, and possible complications of the intervention; the alternatives, including doing nothing; the risk(s) and benefit(s) of the alternative treatment(s) or procedure(s); and the risk(s) and benefit(s) of doing nothing. The patient was provided information about the general risks and possible complications associated with the procedure. These may include, but are not limited to: failure to achieve desired goals, infection, bleeding, organ or nerve damage, allergic reactions, paralysis, and death. In addition, the patient was informed of those risks and complications associated to Spine-related procedures, such as failure to decrease pain; infection (i.e.: Meningitis, epidural or intraspinal abscess); bleeding (i.e.: epidural hematoma, subarachnoid hemorrhage, or any other type of intraspinal or peri-dural bleeding); organ or nerve damage (i.e.: Any type of peripheral nerve, nerve root, or spinal cord injury) with subsequent damage to sensory, motor, and/or autonomic systems, resulting in permanent pain, numbness, and/or weakness of one or several areas of the body; allergic reactions; (i.e.: anaphylactic reaction); and/or death. Furthermore, the patient was informed of those risks and complications associated with the medications. These include, but are not limited to: allergic reactions (i.e.: anaphylactic or anaphylactoid reaction(s)); adrenal axis suppression; blood sugar elevation that in diabetics may result in ketoacidosis or comma; water retention that in patients with history of congestive heart failure may result in shortness of breath, pulmonary edema, and decompensation with resultant heart failure; weight  gain; swelling or edema; medication-induced neural toxicity; particulate matter embolism and blood vessel occlusion with resultant organ, and/or nervous system infarction; and/or aseptic necrosis of one or more joints. Finally, the patient was informed that Medicine is not an exact science; therefore, there is also the possibility of unforeseen or unpredictable risks and/or possible complications that may result in a catastrophic outcome. The patient indicated having understood very clearly. We have given the patient no guarantees and we have made no promises. Enough time was given to the patient to ask questions, all of which were answered to the patient's satisfaction. Ms. Lore has indicated that she wanted to continue with the procedure. Attestation: I, the ordering provider, attest that I have discussed with the patient the benefits, risks, side-effects, alternatives, likelihood of achieving goals, and potential problems during recovery for the procedure that I have provided informed consent. Date  Time: 04/30/2022  8:04 AM  Pre-Procedure Preparation:  Monitoring: As per clinic protocol. Respiration, ETCO2, SpO2, BP, heart rate and rhythm monitor placed and checked for adequate function Safety Precautions: Patient was assessed for positional comfort and pressure points before starting the procedure. Time-out: I initiated and conducted the "Time-out" before starting the procedure, as per protocol. The patient was asked to participate by confirming the accuracy of the "Time Out" information. Verification of the correct person, site, and procedure were performed and confirmed by me, the nursing staff, and the patient. "Time-out" conducted as per Joint Commission's Universal Protocol (UP.01.01.01). Time: 0900  Description of Procedure:          Laterality: Bilateral. The procedure was performed in identical fashion on both sides. Targeted Levels:  L3, L4, L5,  Medial Branch Level(s)  Safety  Precautions: Aspiration looking for blood return was conducted prior to all injections. At no point did we inject any substances, as a needle was being advanced. Before injecting, the patient was told to immediately notify me if she was experiencing any new onset of "ringing in the ears, or metallic taste in the mouth". No attempts were made at seeking any paresthesias. Safe injection practices and needle disposal techniques used. Medications properly  checked for expiration dates. SDV (single dose vial) medications used. After the completion of the procedure, all disposable equipment used was discarded in the proper designated medical waste containers. Local Anesthesia: Protocol guidelines were followed. The patient was positioned over the fluoroscopy table. The area was prepped in the usual manner. The time-out was completed. The target area was identified using fluoroscopy. A 12-in long, straight, sterile hemostat was used with fluoroscopic guidance to locate the targets for each level blocked. Once located, the skin was marked with an approved surgical skin marker. Once all sites were marked, the skin (epidermis, dermis, and hypodermis), as well as deeper tissues (fat, connective tissue and muscle) were infiltrated with a small amount of a short-acting local anesthetic, loaded on a 10cc syringe with a 25G, 1.5-in  Needle. An appropriate amount of time was allowed for local anesthetics to take effect before proceeding to the next step. Local Anesthetic: Lidocaine 2.0% The unused portion of the local anesthetic was discarded in the proper designated containers. Technical description of process:   L3 Medial Branch Nerve Block (MBB): The target area for the L3 medial branch is at the junction of the postero-lateral aspect of the superior articular process and the superior, posterior, and medial edge of the transverse process of L4. Under fluoroscopic guidance, a Quincke needle was inserted until contact was made  with os over the superior postero-lateral aspect of the pedicular shadow (target area). After negative aspiration for blood, 28m of the nerve block solution was injected without difficulty or complication. The needle was removed intact. L4 Medial Branch Nerve Block (MBB): The target area for the L4 medial branch is at the junction of the postero-lateral aspect of the superior articular process and the superior, posterior, and medial edge of the transverse process of L5. Under fluoroscopic guidance, a Quincke needle was inserted until contact was made with os over the superior postero-lateral aspect of the pedicular shadow (target area). After negative aspiration for blood, 270mof the nerve block solution was injected without difficulty or complication. The needle was removed intact. L5 Medial Branch Nerve Block (MBB): The target area for the L5 medial branch is at the junction of the postero-lateral aspect of the superior articular process and the superior, posterior, and medial edge of the sacral ala. Under fluoroscopic guidance, a Quincke needle was inserted until contact was made with os over the superior postero-lateral aspect of the pedicular shadow (target area). After negative aspiration for blood, 105m31mf the nerve block solution was injected without difficulty or complication. The needle was removed intact.  12 cc solution made of 10 cc of 0.2% ropivacaine, 2 cc of Decadron 10 mg/cc.  2 cc injected at each level above bilaterally.  Once the entire procedure was completed, the treated area was cleaned, making sure to leave some of the prepping solution back to take advantage of its long term bactericidal properties.      Illustration of the posterior view of the lumbar spine and the posterior neural structures. Laminae of L2 through S1 are labeled. DPRL5, dorsal primary ramus of L5; DPRS1, dorsal primary ramus of S1; DPR3, dorsal primary ramus of L3; FJ, facet (zygapophyseal) joint L3-L4; I,  inferior articular process of L4; LB1, lateral branch of dorsal primary ramus of L1; IAB, inferior articular branches from L3 medial branch (supplies L4-L5 facet joint); IBP, intermediate branch plexus; MB3, medial branch of dorsal primary ramus of L3; NR3, third lumbar nerve root; S, superior articular process of L5; SAB, superior articular  branches from L4 (supplies L4-5 facet joint also); TP3, transverse process of L3.  Vitals:   04/30/22 0914 04/30/22 0923 04/30/22 0931 04/30/22 0940  BP: (!) 1'08/91 98/82 92/80 '$ 95/71  Pulse:      Resp: 20 (!) '8 16 14  '$ Temp:  (!) 97.2 F (36.2 C)  (!) 96.3 F (35.7 C)  TempSrc:  Tympanic  Tympanic  SpO2: 100% 95% 92% 99%  Weight:      Height:         Start Time: 0900 hrs. End Time: 0914 hrs.  Imaging Guidance (Spinal):          Type of Imaging Technique: Fluoroscopy Guidance (Spinal) Indication(s): Assistance in needle guidance and placement for procedures requiring needle placement in or near specific anatomical locations not easily accessible without such assistance. Exposure Time: Please see nurses notes. Contrast: None used. Fluoroscopic Guidance: I was personally present during the use of fluoroscopy. "Tunnel Vision Technique" used to obtain the best possible view of the target area. Parallax error corrected before commencing the procedure. "Direction-depth-direction" technique used to introduce the needle under continuous pulsed fluoroscopy. Once target was reached, antero-posterior, oblique, and lateral fluoroscopic projection used confirm needle placement in all planes. Images permanently stored in EMR. Interpretation: No contrast injected. I personally interpreted the imaging intraoperatively. Adequate needle placement confirmed in multiple planes. Permanent images saved into the patient's record.  Antibiotic Prophylaxis:   Anti-infectives (From admission, onward)    None      Indication(s): None identified  Post-operative Assessment:   Post-procedure Vital Signs:  Pulse/HCG Rate: 9181 Temp:  (!) 96.3 F (35.7 C) Resp: 14 BP: 95/71 SpO2: 99 %  EBL: None  Complications: No immediate post-treatment complications observed by team, or reported by patient.  Note: The patient tolerated the entire procedure well. A repeat set of vitals were taken after the procedure and the patient was kept under observation following institutional policy, for this type of procedure. Post-procedural neurological assessment was performed, showing return to baseline, prior to discharge. The patient was provided with post-procedure discharge instructions, including a section on how to identify potential problems. Should any problems arise concerning this procedure, the patient was given instructions to immediately contact us, at any time, without hesitation. In any case, we plan to contact the patient by telephone for a follow-up status report regarding this interventional procedure.  Comments:  No additional relevant information.  Plan of Care  Orders:  Orders Placed This Encounter  Procedures   DG PAIN [facility] C-ARM 1-60 MIN NO REPORT    Intraoperative interpretation by procedural physician at [facility].    Standing Status:   Standing    Number of Occurrences:   1    Order Specific Question:   Reason for exam:    Answer:   Assistance in needle guidance and placement for procedures requiring needle placement in or near specific anatomical locations not easily accessible without such assistance.    Medications ordered for procedure: Meds ordered this encounter  Medications   lidocaine (XYLOCAINE) 2 % (with pres) injection 400 mg   midazolam (VERSED) 5 MG/5ML injection 0.5-2 mg    Make sure Flumazenil is available in the pyxis when using this medication. If oversedation occurs, administer 0.2 mg IV over 15 sec. If after 45 sec no response, administer 0.2 mg again over 1 min; may repeat at 1 min intervals; not to exceed 4 doses  (1 mg)   fentaNYL (SUBLIMAZE) injection 25-50 mcg    Make sure  Narcan is available in the pyxis when using this medication. In the event of respiratory depression (RR< 8/min): Titrate NARCAN (naloxone) in increments of 0.1 to 0.2 mg IV at 2-3 minute intervals, until desired degree of reversal.   dexamethasone (DECADRON) injection 10 mg   dexamethasone (DECADRON) injection 10 mg   ropivacaine (PF) 2 mg/mL (0.2%) (NAROPIN) injection 9 mL   ropivacaine (PF) 2 mg/mL (0.2%) (NAROPIN) injection 9 mL   Medications administered: We administered lidocaine, midazolam, fentaNYL, dexamethasone, dexamethasone, ropivacaine (PF) 2 mg/mL (0.2%), and ropivacaine (PF) 2 mg/mL (0.2%).  See the medical record for exact dosing, route, and time of administration.  Follow-up plan:   Return in about 6 weeks (around 06/11/2022) for Post Procedure Evaluation, virtual.      Recent Visits Date Type Provider Dept  04/14/22 Office Visit Gillis Santa, MD [facility]  03/10/22 Procedure visit Gillis Santa, MD [facility]  02/11/22 Office Visit Gillis Santa, MD [facility]  Showing recent visits within past 90 days and meeting all other requirements Today's Visits Date Type Provider Dept  04/30/22 Procedure visit Gillis Santa, MD [facility]  Showing today's visits and meeting all other requirements Future Appointments Date Type Provider Dept  06/11/22 Appointment Gillis Santa, MD [facility]  Showing future appointments within next 90 days and meeting all other requirements  Disposition: Discharge home  Discharge (Date  Time): 04/30/2022; 0944 hrs.   Primary Care Physician: Gladstone Lighter, MD Location: [facility] Note by: Gillis Santa, MD Date: 04/30/2022; Time: 10:34 AM  Disclaimer:  Medicine is not an exact science. The only guarantee in medicine is that nothing is guaranteed. It is important to note that the decision to  proceed with this intervention was based on the information collected from the patient. The Data and conclusions were drawn from the patient's questionnaire, the interview, and the physical examination. Because the information was provided in large part by the patient, it cannot be guaranteed that it has not been purposely or unconsciously manipulated. Every effort has been made to obtain as much relevant data as possible for this evaluation. It is important to note that the conclusions that lead to this procedure are derived in large part from the available data. Always take into account that the treatment will also be dependent on availability of resources and existing treatment guidelines, considered by other Pain Management Practitioners as being common knowledge and practice, at the time of the intervention. For Medico-Legal purposes, it is also important to point out that variation in procedural techniques and pharmacological choices are the acceptable norm. The indications, contraindications, technique, and results of the above procedure should only be interpreted and judged by a Board-Certified Interventional Pain Specialist with extensive familiarity and expertise in the same exact procedure and technique.

## 2022-05-09 ENCOUNTER — Ambulatory Visit: Admit: 2022-05-09 | Payer: Medicare Other

## 2022-05-09 SURGERY — COLONOSCOPY WITH PROPOFOL
Anesthesia: General

## 2022-06-10 ENCOUNTER — Encounter: Payer: Self-pay | Admitting: Student in an Organized Health Care Education/Training Program

## 2022-06-11 ENCOUNTER — Encounter: Payer: Self-pay | Admitting: Student in an Organized Health Care Education/Training Program

## 2022-06-11 ENCOUNTER — Ambulatory Visit
Payer: Medicare Other | Attending: Student in an Organized Health Care Education/Training Program | Admitting: Student in an Organized Health Care Education/Training Program

## 2022-06-11 DIAGNOSIS — G894 Chronic pain syndrome: Secondary | ICD-10-CM

## 2022-06-11 DIAGNOSIS — M47816 Spondylosis without myelopathy or radiculopathy, lumbar region: Secondary | ICD-10-CM

## 2022-06-11 NOTE — Progress Notes (Signed)
Patient: Sally Hughes  Service Category: E/M  Provider: Gillis Santa, MD  DOB: 1961/10/23  DOS: 06/11/2022  Location: Office  MRN: [id number]  Setting: Ambulatory outpatient  Referring Provider: Gladstone Lighter, MD  Type: Established Patient  Specialty: Interventional Pain Management  PCP: Gladstone Lighter, MD  Location: Remote location  Delivery: TeleHealth     Virtual Encounter - Pain Management PROVIDER NOTE: Information contained herein reflects review and annotations entered in association with encounter. Interpretation of such information and data should be left to medically-trained personnel. Information provided to patient can be located elsewhere in the medical record under "Patient Instructions". Document created using STT-dictation technology, any transcriptional errors that may result from process are unintentional.    Contact & Pharmacy Preferred: [phone] Home: [phone] (home) Mobile: There is no such number on file (mobile). E-mail: No e-mail address on record  [id number] [place] - [address] [place]: [phone]: [phone] [place] [address]: [phone]: [phone]  Pre-screening  [name] "in-person" vs "virtual" encounter. She indicated preferring virtual for this encounter.   Reason COVID-19*  Social distancing based on CDC and AMA recommendations.   I contacted Sally Bathon 06/11/2022 via telephone.      I clearly identified myself as BGillis Santa MD. I verified that I was speaking with the correct person using two identifiers (Name: Sally Hughes and date of birth: [birth year].  Consent I sought verbal advanced consent from Sally Bathfor virtual visit interactions. I informed [name] possible security and privacy concerns, risks, and  limitations associated with providing "not-in-person" medical evaluation and management services. I also informed [name] the availability of "in-person" appointments. Finally, I informed her that there would be a charge for the virtual visit and that she could be  personally, fully or partially, financially responsible for it. Ms. MBartellexpressed understanding and agreed to proceed.   Historic Elements   Ms. ESHARIYAH Hughes a 61y.o. year old, female patient evaluated today after our last contact on 04/30/2022. Sally Hughes has a past medical history of Arthritis, Asthma, Back pain, Bell's palsy, COPD (chronic obstructive pulmonary disease) ([place], Depression, Diabetes mellitus, type 2 ([place], Dyspnea, Family history of adverse reaction to anesthesia, GERD (gastroesophageal reflux disease), Headache, Hepatitis C, Hypertension, Kidney tumor (benign), left, Pneumonia, and Snake bite. She also  has a past surgical history that includes Tumor removal; Tubal ligation; Cataract extraction w/PHACO (Left, 04/19/2018); Cataract extraction w/PHACO (Right, 05/19/2018); Microlaryngoscopy (Right, 01/13/2020); XI robotic assisted ventral hernia (N/A, 06/13/2020); and Carpometacarpal (cmc) fusion of thumb (Left, 11/27/2021). Ms. MSiegmannhas a current medication list which includes the following prescription(s): albuterol, amitriptyline, amitriptyline, apple cider vinegar, true metrix meter, celecoxib, cyanocobalamin, cyclobenzaprine, cyclobenzaprine, doxycycline, hydrocodone-acetaminophen, hydroxyzine, meclizine, meloxicam, metformin, metoprolol tartrate, omega-3 fatty acids, omeprazole, pharmacist choice lancets, pravastatin, ropinirole, true metrix blood glucose test, venlafaxine xr, vitamin d, vitamin e, mometasone-formoterol, and pregabalin. She  reports that she has been smoking cigarettes. She has a 30.00 pack-year smoking history. She has never used smokeless tobacco. She reports that she does not currently use drugs  after having used the following drugs: Marijuana. She reports that she does not drink alcohol. Sally Hughes allergic to buspirone, gabapentin, and pregabalin.   HPI  Today, she is being contacted for a post-procedure  assessment.   Post-procedure evaluation   Type: Lumbar Facet, Medial Branch Block(s) #2  Laterality: Bilateral  Level: L3, L4, L5,  Medial Branch Level(s). Injecting these levels blocks the L3-4 and L5-S1 lumbar facet joints. Imaging: Fluoroscopic guidance Anesthesia: Local anesthesia (1-2% Lidocaine) Sedation: Moderate conscious sedation. DOS: 04/30/2022 Performed by: Gillis Santa, MD  Primary Purpose: Diagnostic/Therapeutic Indications: Low back pain severe enough to impact quality of life or function. 1. Lumbar facet arthropathy   2. Lumbar spondylosis   3. Chronic pain syndrome    NAS-11 Pain score:   Pre-procedure: 7 /10   Post-procedure: 0-No pain/10      Effectiveness:  Initial hour after procedure: 100 %  Subsequent 4-6 hours post-procedure: 100 %  Analgesia past initial 6 hours: 100 % (1week)  Ongoing improvement:  Analgesic:  <25% Function: Back to baseline ROM: Back to baseline   Laboratory Chemistry Profile   Renal Lab Results  Component Value Date   BUN 15 05/20/2021   CREATININE 0.94 05/20/2021   GFRAA 43 (L) 05/25/2019   GFRNONAA >60 05/20/2021    Hepatic Lab Results  Component Value Date   AST 17 09/09/2018   ALT 11 09/09/2018   ALBUMIN 3.6 09/09/2018   ALKPHOS 92 09/09/2018    Electrolytes Lab Results  Component Value Date   NA 135 05/20/2021   K 5.4 (H) 05/20/2021   CL 100 05/20/2021   CALCIUM 9.0 05/20/2021    Bone No results found for: "VD25OH", "VD125OH2TOT", "TZ0017CB4", "WH6759FM3", "25OHVITD1", "25OHVITD2", "25OHVITD3", "TESTOFREE", "TESTOSTERONE"  Inflammation (CRP: Acute Phase) (ESR: Chronic Phase) No results found for: "CRP", "ESRSEDRATE", "LATICACIDVEN"       Note: Above Lab results reviewed.   Assessment   The primary encounter diagnosis was Lumbar facet arthropathy. Diagnoses of Lumbar spondylosis and Chronic pain syndrome were also pertinent to this visit.  Plan of Care  Problem-specific:  Lumbar spondylosis [name] is status post 2 positive diagnostic lumbar facet medial branch nerve blocks on 03/10/2022 as well as 04/30/2022 both of which provided at least 90% pain relief for a week with gradual return of pain thereafter.  She states that she was able to perform ADLs more comfortably and in less pain during the first couple of weeks after her medial branch nerve blocks.  We discussed lumbar radiofrequency ablation for the purpose of obtaining longer-term pain relief.  Risk and benefits reviewed and patient would like to proceed.  She will like to start with the right side first as that is more painful.  We will follow that with the left side 2 weeks after.  This will be done under moderate sedation in ECT suite.  Ms. ALAZIA CROCKET has a current medication list which includes the following long-term medication(s): albuterol, amitriptyline, amitriptyline, metformin, metoprolol tartrate, omeprazole, pravastatin, ropinirole, mometasone-formoterol, and pregabalin.  Orders:  Orders Placed This Encounter  Procedures   Radiofrequency,Lumbar    Standing Status:   Future    Standing Expiration Date:   12/12/2022    Scheduling Instructions:     Side(s): RIGHT     Level(s): L3, L4, L5, Medial Branch Nerve(s)     Sedation: With Sedation     Scheduling Timeframe: As soon as pre-approved    Order Specific Question:   Where will this procedure be performed?    Answer:   [facility]   Radiofrequency,Lumbar    Standing Status:   Future    Standing Expiration Date:   12/12/2022    Scheduling Instructions:  Side(s): LEFT     Level(s): L3, L4, L5, Medial Branch Nerve(s)     Sedation: With Sedation     Scheduling Timeframe: 2 weeks after right    Order Specific Question:   Where will this  procedure be performed?    Answer:   [facility]   Follow-up plan:   Return for Right L3, L4, L5 RFA, ECT.    Recent Visits Date Type Provider Dept  04/30/22 Procedure visit Gillis Santa, MD [facility]  04/14/22 Office Visit Gillis Santa, MD [facility]  Showing recent visits within past 90 days and meeting all other requirements Today's Visits Date Type Provider Dept  06/11/22 Office Visit Gillis Santa, MD [facility]  Showing today's visits and meeting all other requirements Future Appointments No visits were found meeting these conditions. Showing future appointments within next 90 days and meeting all other requirements  I discussed the assessment and treatment plan with the patient. The patient was provided an opportunity to ask questions and all were answered. The patient agreed with the plan and demonstrated an understanding of the instructions.  Patient advised to call back or seek an in-person evaluation if the symptoms or condition worsens.  Duration of encounter: 85mnutes.  Note by: BGillis Santa MD Date: 06/11/2022; Time: 3:17 PM

## 2022-06-11 NOTE — Assessment & Plan Note (Signed)
Sally Hughes is status post 2 positive diagnostic lumbar facet medial branch nerve blocks on 03/10/2022 as well as 04/30/2022 both of which provided at least 90% pain relief for a week with gradual return of pain thereafter.  She states that she was able to perform ADLs more comfortably and in less pain during the first couple of weeks after her medial branch nerve blocks.  We discussed lumbar radiofrequency ablation for the purpose of obtaining longer-term pain relief.  Risk and benefits reviewed and patient would like to proceed.  She will like to start with the right side first as that is more painful.  We will follow that with the left side 2 weeks after.  This will be done under moderate sedation in ECT suite.

## 2022-06-19 ENCOUNTER — Ambulatory Visit: Payer: Medicare Other | Attending: Neurology

## 2022-06-19 DIAGNOSIS — R519 Headache, unspecified: Secondary | ICD-10-CM | POA: Insufficient documentation

## 2022-06-19 DIAGNOSIS — G4733 Obstructive sleep apnea (adult) (pediatric): Secondary | ICD-10-CM | POA: Diagnosis not present

## 2022-06-19 DIAGNOSIS — R0683 Snoring: Secondary | ICD-10-CM | POA: Insufficient documentation

## 2022-06-19 DIAGNOSIS — R400 Somnolence: Secondary | ICD-10-CM | POA: Diagnosis present

## 2022-06-30 ENCOUNTER — Ambulatory Visit
Admission: RE | Admit: 2022-06-30 | Discharge: 2022-06-30 | Disposition: A | Discharge status: Home / Self Care | Payer: Medicare Other | Source: Ambulatory Visit | Attending: Student in an Organized Health Care Education/Training Program | Admitting: Student in an Organized Health Care Education/Training Program

## 2022-06-30 ENCOUNTER — Ambulatory Visit
Payer: Medicare Other | Attending: Student in an Organized Health Care Education/Training Program | Admitting: Student in an Organized Health Care Education/Training Program

## 2022-06-30 ENCOUNTER — Encounter: Payer: Self-pay | Admitting: Student in an Organized Health Care Education/Training Program

## 2022-06-30 DIAGNOSIS — M47816 Spondylosis without myelopathy or radiculopathy, lumbar region: Secondary | ICD-10-CM | POA: Insufficient documentation

## 2022-06-30 DIAGNOSIS — G894 Chronic pain syndrome: Secondary | ICD-10-CM | POA: Insufficient documentation

## 2022-06-30 MED ORDER — ROPIVACAINE HCL 2 MG/ML IJ SOLN
9.0000 mL | Freq: Once | INTRAMUSCULAR | Status: AC
  Administered 2022-06-30: 9 mL via PERINEURAL

## 2022-06-30 MED ORDER — LIDOCAINE HCL 2 % IJ SOLN
INTRAMUSCULAR | Status: AC
  Filled 2022-06-30: qty 20

## 2022-06-30 MED ORDER — MIDAZOLAM HCL 5 MG/5ML IJ SOLN
INTRAMUSCULAR | Status: AC
  Filled 2022-06-30: qty 5

## 2022-06-30 MED ORDER — ROPIVACAINE HCL 2 MG/ML IJ SOLN
INTRAMUSCULAR | Status: AC
  Filled 2022-06-30: qty 20

## 2022-06-30 MED ORDER — FENTANYL CITRATE (PF) 100 MCG/2ML IJ SOLN
INTRAMUSCULAR | Status: AC
  Filled 2022-06-30: qty 2

## 2022-06-30 MED ORDER — DEXAMETHASONE SODIUM PHOSPHATE 10 MG/ML IJ SOLN
INTRAMUSCULAR | Status: AC
  Filled 2022-06-30: qty 1

## 2022-06-30 MED ORDER — LACTATED RINGERS IV SOLN: Freq: Once | INTRAVENOUS | Status: AC

## 2022-06-30 MED ORDER — MIDAZOLAM HCL 5 MG/5ML IJ SOLN
0.5000 mg | Freq: Once | INTRAMUSCULAR | Status: AC
  Administered 2022-06-30: 1 mg via INTRAVENOUS

## 2022-06-30 MED ORDER — DEXAMETHASONE SODIUM PHOSPHATE 10 MG/ML IJ SOLN
10.0000 mg | Freq: Once | INTRAMUSCULAR | Status: AC
  Administered 2022-06-30: 10 mg

## 2022-06-30 MED ORDER — LIDOCAINE HCL 2 % IJ SOLN
20.0000 mL | Freq: Once | INTRAMUSCULAR | Status: AC
Start: 2022-06-30 — End: 2022-06-30
  Administered 2022-06-30: 400 mg

## 2022-06-30 MED ORDER — FENTANYL CITRATE (PF) 100 MCG/2ML IJ SOLN
25.0000 ug | INTRAMUSCULAR | Status: DC | PRN
  Administered 2022-06-30: 75 ug via INTRAVENOUS

## 2022-06-30 NOTE — Progress Notes (Signed)
PROVIDER NOTE: Interpretation of information contained herein should be left to medically-trained personnel. Specific patient instructions are provided elsewhere under "Patient Instructions" section of medical record. This document was created in part using STT-dictation technology, any transcriptional errors that may result from this process are unintentional.  Patient: [name] Type: Established DOB: [birth year] MRN: [id number] PCP: Gladstone Lighter, MD  Service: Procedure DOS: 06/30/2022 Setting: Ambulatory Location: Ambulatory outpatient facility Delivery: Face-to-face Provider: Gillis Santa, MD Specialty: Interventional Pain Management Specialty designation: 09 Location: Outpatient facility Ref. Prov.: Gillis Santa, MD    Procedure:           Type: Lumbar Facet, Medial Branch Radiofrequency Ablation (RFA) #1  Laterality: Right (-RT)  Level:L3, L4, L5,  Medial Branch Level(s). These levels will denervate the L3-4, and L4-5lumbar facet joints.  Imaging: Fluoroscopy-guided         Anesthesia: Local anesthesia (1-2% Lidocaine) Sedation: Moderate Sedation Moderate conscious sedation.  DOS: 06/30/2022  Performed by: Gillis Santa, MD  Purpose: Therapeutic/Palliative Indications: Low back pain severe enough to impact quality of life or function. Indications: 1. Lumbar facet arthropathy   2. Lumbar spondylosis   3. Chronic pain syndrome    Ms. Ladouceur has been dealing with the above chronic pain for longer than three months and has either failed to respond, was unable to tolerate, or simply did not get enough benefit from other more conservative therapies including, but not limited to: 1. Over-the-counter medications 2. Anti-inflammatory medications 3. Muscle relaxants 4. Membrane stabilizers 5. Opioids 6. Physical therapy and/or chiropractic manipulation 7. Modalities (Heat, ice, etc.) 8. Invasive techniques such as nerve blocks. Ms. Klimas has attained more than 50%  relief of the pain from a series of diagnostic injections conducted in separate occasions.  Pain Score: Pre-procedure: 9 /10 Post-procedure: 0-No pain/10     Position / Prep / Materials:  Position: Prone  Prep solution: DuraPrep (Iodine Povacrylex [0.7% available iodine] and Isopropyl Alcohol, 74% w/w) Prep Area: Entire Lumbosacral Region (Lower back from mid-thoracic region to end of tailbone and from flank to flank.) Materials:  Tray: RFA (Radiofrequency) tray Needle(s):  Type: RFA (Teflon-coated radiofrequency ablation needles) Gauge (G): 20  Length: Long (15cm) Qty: 3  Pre-op H&P Assessment:  Ms. Lanpher is a 61 y.o. (year old), female patient, seen today for interventional treatment. She  has a past surgical history that includes Tumor removal; Tubal ligation; Cataract extraction w/PHACO (Left, 04/19/2018); Cataract extraction w/PHACO (Right, 05/19/2018); Microlaryngoscopy (Right, 01/13/2020); XI robotic assisted ventral hernia (N/A, 06/13/2020); and Carpometacarpal (cmc) fusion of thumb (Left, 11/27/2021). Ms. Sharma has a current medication list which includes the following prescription(s): albuterol, amitriptyline, amitriptyline, apple cider vinegar, true metrix meter, celecoxib, cyanocobalamin, cyclobenzaprine, cyclobenzaprine, doxycycline, hydrocodone-acetaminophen, hydroxyzine, meclizine, meloxicam, metformin, metoprolol tartrate, omega-3 fatty acids, omeprazole, pharmacist choice lancets, pravastatin, ropinirole, true metrix blood glucose test, venlafaxine xr, vitamin d, vitamin e, mometasone-formoterol, and pregabalin, and the following Facility-Administered Medications: fentanyl. Her primarily concern today is the Back Pain (lower)  Initial Vital Signs:  Pulse/HCG Rate: 76ECG Heart Rate: 72 (NSR) Temp:  (!) 97.3 F (36.3 C) Resp: 16 BP: 109/72 SpO2: 99 %  BMI: Estimated body mass index is 34.01 kg/m as calculated from the following:   Height as of this encounter: '5\' 3"'$  (1.6  m).   Weight as of this encounter: 192 lb (87.1 kg).  Risk Assessment: Allergies: Reviewed. She is allergic to buspirone, gabapentin, and pregabalin.  Allergy Precautions: None required Coagulopathies: Reviewed. None identified.  Blood-thinner therapy: None at this  time Active Infection(s): Reviewed. None identified. Ms. Balsley is afebrile  Site Confirmation: Ms. Hinchey was asked to confirm the procedure and laterality before marking the site Procedure checklist: Completed Consent: Before the procedure and under the influence of no sedative(s), amnesic(s), or anxiolytics, the patient was informed of the treatment options, risks and possible complications. To fulfill our ethical and legal obligations, as recommended by the American Medical Association's Code of Ethics, I have informed the patient of my clinical impression; the nature and purpose of the treatment or procedure; the risks, benefits, and possible complications of the intervention; the alternatives, including doing nothing; the risk(s) and benefit(s) of the alternative treatment(s) or procedure(s); and the risk(s) and benefit(s) of doing nothing. The patient was provided information about the general risks and possible complications associated with the procedure. These may include, but are not limited to: failure to achieve desired goals, infection, bleeding, organ or nerve damage, allergic reactions, paralysis, and death. In addition, the patient was informed of those risks and complications associated to Spine-related procedures, such as failure to decrease pain; infection (i.e.: Meningitis, epidural or intraspinal abscess); bleeding (i.e.: epidural hematoma, subarachnoid hemorrhage, or any other type of intraspinal or peri-dural bleeding); organ or nerve damage (i.e.: Any type of peripheral nerve, nerve root, or spinal cord injury) with subsequent damage to sensory, motor, and/or autonomic systems, resulting in permanent pain, numbness,  and/or weakness of one or several areas of the body; allergic reactions; (i.e.: anaphylactic reaction); and/or death. Furthermore, the patient was informed of those risks and complications associated with the medications. These include, but are not limited to: allergic reactions (i.e.: anaphylactic or anaphylactoid reaction(s)); adrenal axis suppression; blood sugar elevation that in diabetics may result in ketoacidosis or comma; water retention that in patients with history of congestive heart failure may result in shortness of breath, pulmonary edema, and decompensation with resultant heart failure; weight gain; swelling or edema; medication-induced neural toxicity; particulate matter embolism and blood vessel occlusion with resultant organ, and/or nervous system infarction; and/or aseptic necrosis of one or more joints. Finally, the patient was informed that Medicine is not an exact science; therefore, there is also the possibility of unforeseen or unpredictable risks and/or possible complications that may result in a catastrophic outcome. The patient indicated having understood very clearly. We have given the patient no guarantees and we have made no promises. Enough time was given to the patient to ask questions, all of which were answered to the patient's satisfaction. Ms. Neal has indicated that she wanted to continue with the procedure. Attestation: I, the ordering provider, attest that I have discussed with the patient the benefits, risks, side-effects, alternatives, likelihood of achieving goals, and potential problems during recovery for the procedure that I have provided informed consent. Date  Time: 06/30/2022  8:07 AM  Pre-Procedure Preparation:  Monitoring: As per clinic protocol. Respiration, ETCO2, SpO2, BP, heart rate and rhythm monitor placed and checked for adequate function Safety Precautions: Patient was assessed for positional comfort and pressure points before starting the  procedure. Time-out: I initiated and conducted the "Time-out" before starting the procedure, as per protocol. The patient was asked to participate by confirming the accuracy of the "Time Out" information. Verification of the correct person, site, and procedure were performed and confirmed by me, the nursing staff, and the patient. "Time-out" conducted as per Joint Commission's Universal Protocol (UP.01.01.01). Time: 0912  Description of Procedure:          Laterality: Right Levels:   L3, L4, L5,  Medial Branch Level(s). Safety Precautions: Aspiration looking for blood return was conducted prior to all injections. At no point did we inject any substances, as a needle was being advanced. Before injecting, the patient was told to immediately notify me if she was experiencing any new onset of "ringing in the ears, or metallic taste in the mouth". No attempts were made at seeking any paresthesias. Safe injection practices and needle disposal techniques used. Medications properly checked for expiration dates. SDV (single dose vial) medications used. After the completion of the procedure, all disposable equipment used was discarded in the proper designated medical waste containers. Local Anesthesia: Protocol guidelines were followed. The patient was positioned over the fluoroscopy table. The area was prepped in the usual manner. The time-out was completed. The target area was identified using fluoroscopy. A 12-in long, straight, sterile hemostat was used with fluoroscopic guidance to locate the targets for each level blocked. Once located, the skin was marked with an approved surgical skin marker. Once all sites were marked, the skin (epidermis, dermis, and hypodermis), as well as deeper tissues (fat, connective tissue and muscle) were infiltrated with a small amount of a short-acting local anesthetic, loaded on a 10cc syringe with a 25G, 1.5-in  Needle. An appropriate amount of time was allowed for local  anesthetics to take effect before proceeding to the next step. Technical description of process:  Radiofrequency Ablation (RFA)  L3 Medial Branch Nerve RFA: The target area for the L3 medial branch is at the junction of the postero-lateral aspect of the superior articular process and the superior, posterior, and medial edge of the transverse process of L4. Under fluoroscopic guidance, a Radiofrequency needle was inserted until contact was made with os over the superior postero-lateral aspect of the pedicular shadow (target area). Sensory and motor testing was conducted to properly adjust the position of the needle. Once satisfactory placement of the needle was achieved, the numbing solution was slowly injected after negative aspiration for blood. 2.0 mL of the nerve block solution was injected without difficulty or complication. After waiting for at least 3 minutes, the ablation was performed. Once completed, the needle was removed intact. L4 Medial Branch Nerve RFA: The target area for the L4 medial branch is at the junction of the postero-lateral aspect of the superior articular process and the superior, posterior, and medial edge of the transverse process of L5. Under fluoroscopic guidance, a Radiofrequency needle was inserted until contact was made with os over the superior postero-lateral aspect of the pedicular shadow (target area). Sensory and motor testing was conducted to properly adjust the position of the needle. Once satisfactory placement of the needle was achieved, the numbing solution was slowly injected after negative aspiration for blood. 2.0 mL of the nerve block solution was injected without difficulty or complication. After waiting for at least 3 minutes, the ablation was performed. Once completed, the needle was removed intact. L5 Medial Branch Nerve RFA: The target area for the L5 medial branch is at the junction of the postero-lateral aspect of the superior articular process of S1 and  the superior, posterior, and medial edge of the sacral ala. Under fluoroscopic guidance, a Radiofrequency needle was inserted until contact was made with os over the superior postero-lateral aspect of the pedicular shadow (target area). Sensory and motor testing was conducted to properly adjust the position of the needle. Once satisfactory placement of the needle was achieved, the numbing solution was slowly injected after negative aspiration for blood. 2.0 mL of the nerve  block solution was injected without difficulty or complication. After waiting for at least 3 minutes, the ablation was performed. Once completed, the needle was removed intact.  Radiofrequency lesioning (ablation):  Radiofrequency Generator: Medtronic AccurianTM AG 1000 RF Generator Sensory Stimulation Parameters: 50 Hz was used to locate & identify the nerve, making sure that the needle was positioned such that there was no sensory stimulation below 0.3 V or above 0.7 V. Motor Stimulation Parameters: 2 Hz was used to evaluate the motor component. Care was taken not to lesion any nerves that demonstrated motor stimulation of the lower extremities at an output of less than 2.5 times that of the sensory threshold, or a maximum of 2.0 V. Lesioning Technique Parameters: Standard Radiofrequency settings. (Not bipolar or pulsed.) Temperature Settings: 80 degrees C Lesioning time: 60 seconds Intra-operative Compliance: Compliant   6cc solution made of 5cc of 0.2% ropivacaine, 1 cc of Decadron 10 mg/cc.  2 cc injected at each level above for the right   Once the entire procedure was completed, the treated area was cleaned, making sure to leave some of the prepping solution back to take advantage of its long term bactericidal properties.    Illustration of the posterior view of the lumbar spine and the posterior neural structures. Laminae of L2 through S1 are labeled. DPRL5, dorsal primary ramus of L5; DPRS1, dorsal primary ramus of S1;  DPR3, dorsal primary ramus of L3; FJ, facet (zygapophyseal) joint L3-L4; I, inferior articular process of L4; LB1, lateral branch of dorsal primary ramus of L1; IAB, inferior articular branches from L3 medial branch (supplies L4-L5 facet joint); IBP, intermediate branch plexus; MB3, medial branch of dorsal primary ramus of L3; NR3, third lumbar nerve root; S, superior articular process of L5; SAB, superior articular branches from L4 (supplies L4-5 facet joint also); TP3, transverse process of L3.  Vitals:   06/30/22 0922 06/30/22 0927 06/30/22 0935 06/30/22 0945  BP: 113/88 100/64 103/62 (!) 100/57  Pulse:      Resp: '13 15 16 17  '$ Temp:      TempSrc:      SpO2: 100% 99% 93% 95%  Weight:      Height:        Start Time: 0912 hrs. End Time: 0927 hrs.  Imaging Guidance (Spinal):          Type of Imaging Technique: Fluoroscopy Guidance (Spinal) Indication(s): Assistance in needle guidance and placement for procedures requiring needle placement in or near specific anatomical locations not easily accessible without such assistance. Exposure Time: Please see nurses notes. Contrast: None used. Fluoroscopic Guidance: I was personally present during the use of fluoroscopy. "Tunnel Vision Technique" used to obtain the best possible view of the target area. Parallax error corrected before commencing the procedure. "Direction-depth-direction" technique used to introduce the needle under continuous pulsed fluoroscopy. Once target was reached, antero-posterior, oblique, and lateral fluoroscopic projection used confirm needle placement in all planes. Images permanently stored in EMR. Interpretation: No contrast injected. I personally interpreted the imaging intraoperatively. Adequate needle placement confirmed in multiple planes. Permanent images saved into the patient's record.  Antibiotic Prophylaxis:   Anti-infectives (From admission, onward)    None      Indication(s): None  identified  Post-operative Assessment:  Post-procedure Vital Signs:  Pulse/HCG Rate: 7676 Temp:  (!) 97.3 F (36.3 C) Resp: 17 BP: (!) 100/57 SpO2: 95 %  EBL: None  Complications: No immediate post-treatment complications observed by team, or reported by patient.  Note: The patient tolerated the  entire procedure well. A repeat set of vitals were taken after the procedure and the patient was kept under observation following institutional policy, for this type of procedure. Post-procedural neurological assessment was performed, showing return to baseline, prior to discharge. The patient was provided with post-procedure discharge instructions, including a section on how to identify potential problems. Should any problems arise concerning this procedure, the patient was given instructions to immediately contact us, at any time, without hesitation. In any case, we plan to contact the patient by telephone for a follow-up status report regarding this interventional procedure.  Comments:  No additional relevant information.  Plan of Care  Orders:  Orders Placed This Encounter  Procedures   DG PAIN CLINIC C-ARM 1-60 MIN NO REPORT    Intraoperative interpretation by procedural physician at [facility].    Standing Status:   Standing    Number of Occurrences:   1    Order Specific Question:   Reason for exam:    Answer:   Assistance in needle guidance and placement for procedures requiring needle placement in or near specific anatomical locations not easily accessible without such assistance.     Medications ordered for procedure: Meds ordered this encounter  Medications   lidocaine (XYLOCAINE) 2 % (with pres) injection 400 mg   lactated ringers infusion   midazolam (VERSED) 5 MG/5ML injection 0.5-2 mg    Make sure Flumazenil is available in the pyxis when using this medication. If oversedation occurs, administer 0.2 mg IV over 15 sec. If after 45 sec no response, administer  0.2 mg again over 1 min; may repeat at 1 min intervals; not to exceed 4 doses (1 mg)   fentaNYL (SUBLIMAZE) injection 25-50 mcg    Make sure Narcan is available in the pyxis when using this medication. In the event of respiratory depression (RR< 8/min): Titrate NARCAN (naloxone) in increments of 0.1 to 0.2 mg IV at 2-3 minute intervals, until desired degree of reversal.   dexamethasone (DECADRON) injection 10 mg   ropivacaine (PF) 2 mg/mL (0.2%) (NAROPIN) injection 9 mL   Medications administered: We administered lidocaine, lactated ringers, midazolam, fentaNYL, dexamethasone, and ropivacaine (PF) 2 mg/mL (0.2%).  See the medical record for exact dosing, route, and time of administration.  Follow-up plan:   Return in 2 weeks (on 07/14/2022) for Contra-lateral RFA, IN CLINIC, IV VERSED ONLY.       Right L3,4,5 RFA 06/30/22   Recent Visits Date Type Provider Dept  06/11/22 Office Visit Gillis Santa, MD [facility]  04/30/22 Procedure visit Gillis Santa, MD [facility]  04/14/22 Office Visit Gillis Santa, MD [facility]  Showing recent visits within past 90 days and meeting all other requirements Today's Visits Date Type Provider Dept  06/30/22 Procedure visit Gillis Santa, MD [facility]  Showing today's visits and meeting all other requirements Future Appointments No visits were found meeting these conditions. Showing future appointments within next 90 days and meeting all other requirements  Disposition: Discharge home  Discharge (Date  Time): 06/30/2022; 0953 hrs.   Primary Care Physician: Gladstone Lighter, MD Location: [facility] Note by: Gillis Santa, MD Date: 06/30/2022; Time: 10:34 AM  Disclaimer:  Medicine is not an exact science. The only guarantee in medicine is that nothing is guaranteed. It is important to note that the decision to proceed with this intervention was based on the information  collected from the patient. The Data and conclusions were drawn from the patient's  questionnaire, the interview, and the physical examination. Because the information was provided in large part by the patient, it cannot be guaranteed that it has not been purposely or unconsciously manipulated. Every effort has been made to obtain as much relevant data as possible for this evaluation. It is important to note that the conclusions that lead to this procedure are derived in large part from the available data. Always take into account that the treatment will also be dependent on availability of resources and existing treatment guidelines, considered by other Pain Management Practitioners as being common knowledge and practice, at the time of the intervention. For Medico-Legal purposes, it is also important to point out that variation in procedural techniques and pharmacological choices are the acceptable norm. The indications, contraindications, technique, and results of the above procedure should only be interpreted and judged by a Board-Certified Interventional Pain Specialist with extensive familiarity and expertise in the same exact procedure and technique.

## 2022-06-30 NOTE — Patient Instructions (Signed)

## 2022-07-01 ENCOUNTER — Telehealth: Payer: Self-pay | Admitting: *Deleted

## 2022-07-01 DIAGNOSIS — M47816 Spondylosis without myelopathy or radiculopathy, lumbar region: Secondary | ICD-10-CM

## 2022-07-01 DIAGNOSIS — G894 Chronic pain syndrome: Secondary | ICD-10-CM

## 2022-07-01 MED ORDER — METHYLPREDNISOLONE 4 MG PO TBPK: ORAL_TABLET | ORAL | 0 refills | Status: AC

## 2022-07-01 NOTE — Telephone Encounter (Signed)
Post procedure call;  patient states she is in a lot of pain since procedure.  Reports that the right side from shoulder down to buttocks is painful.  Has used ice, tylenol, BC powders and muscle relaxer with very little results.  Explained rational of RFA and that the first 2 weeks are likely to be a little painful but should improve in that timeframe.  Patient asked if I would ask Dr Holley Raring if he would call in something for post-procedural pain.  Patient states he has never prescribed for her before.  Told her that I am not sure that he will now but she would like for me to ask.  Told patient that I would send a message to him.  Patient would like any medication that he may send sent to [facility] in [place].

## 2022-07-01 NOTE — Telephone Encounter (Signed)
Called patient to let her know that steroid taper has been sent in.  Went to [facility].

## 2022-07-23 ENCOUNTER — Ambulatory Visit (HOSPITAL_BASED_OUTPATIENT_CLINIC_OR_DEPARTMENT_OTHER): Payer: Medicare Other | Admitting: Student in an Organized Health Care Education/Training Program

## 2022-07-23 ENCOUNTER — Ambulatory Visit
Admission: RE | Admit: 2022-07-23 | Discharge: 2022-07-23 | Disposition: A | Discharge status: Home / Self Care | Payer: Medicare Other | Source: Ambulatory Visit | Attending: Student in an Organized Health Care Education/Training Program | Admitting: Student in an Organized Health Care Education/Training Program

## 2022-07-23 ENCOUNTER — Other Ambulatory Visit: Payer: Self-pay | Admitting: Student in an Organized Health Care Education/Training Program

## 2022-07-23 ENCOUNTER — Encounter: Payer: Self-pay | Admitting: Student in an Organized Health Care Education/Training Program

## 2022-07-23 DIAGNOSIS — R52 Pain, unspecified: Secondary | ICD-10-CM | POA: Insufficient documentation

## 2022-07-23 DIAGNOSIS — G894 Chronic pain syndrome: Secondary | ICD-10-CM | POA: Diagnosis present

## 2022-07-23 DIAGNOSIS — M47816 Spondylosis without myelopathy or radiculopathy, lumbar region: Secondary | ICD-10-CM | POA: Insufficient documentation

## 2022-07-23 MED ORDER — ROPIVACAINE HCL 2 MG/ML IJ SOLN: 9.0000 mL | Freq: Once | INTRAMUSCULAR | Status: AC

## 2022-07-23 MED ORDER — DEXAMETHASONE SODIUM PHOSPHATE 10 MG/ML IJ SOLN: 10.0000 mg | Freq: Once | INTRAMUSCULAR | Status: AC

## 2022-07-23 MED ORDER — LIDOCAINE HCL 2 % IJ SOLN: 20.0000 mL | Freq: Once | INTRAMUSCULAR | Status: AC

## 2022-07-23 MED ORDER — DEXAMETHASONE SODIUM PHOSPHATE 10 MG/ML IJ SOLN
INTRAMUSCULAR | Status: AC
  Filled 2022-07-23: qty 1

## 2022-07-23 MED ORDER — MIDAZOLAM HCL 5 MG/5ML IJ SOLN
INTRAMUSCULAR | Status: AC
  Filled 2022-07-23: qty 5

## 2022-07-23 MED ORDER — LACTATED RINGERS IV SOLN: Freq: Once | INTRAVENOUS | Status: AC

## 2022-07-23 MED ORDER — ROPIVACAINE HCL 2 MG/ML IJ SOLN
INTRAMUSCULAR | Status: AC
  Filled 2022-07-23: qty 20

## 2022-07-23 MED ORDER — LIDOCAINE HCL 2 % IJ SOLN
INTRAMUSCULAR | Status: AC
  Filled 2022-07-23: qty 20

## 2022-07-23 MED ORDER — MIDAZOLAM HCL 5 MG/5ML IJ SOLN
0.5000 mg | Freq: Once | INTRAMUSCULAR | Status: AC
  Administered 2022-07-23: 2 mg via INTRAVENOUS

## 2022-07-23 NOTE — Addendum Note (Signed)
Addended by: Gillis Santa on: 07/23/2022 10:40 AM   Modules accepted: Orders

## 2022-07-23 NOTE — Progress Notes (Signed)
1000 Procedure aborted per patient request. No follow up per Dr. Holley Raring.

## 2022-07-23 NOTE — Progress Notes (Signed)
Safety precautions to be maintained throughout the outpatient stay will include: orient to surroundings, keep bed in low position, maintain call bell within reach at all times, provide assistance with transfer out of bed and ambulation.  

## 2022-07-23 NOTE — Progress Notes (Signed)
PROVIDER NOTE: Interpretation of information contained herein should be left to medically-trained personnel. Specific patient instructions are provided elsewhere under "Patient Instructions" section of medical record. This document was created in part using STT-dictation technology, any transcriptional errors that may result from this process are unintentional.  Patient: Sally Hughes Type: Established DOB: 02-18-1961 MRN: [id number] PCP: Gladstone Lighter, MD  Service: Procedure DOS: 07/23/2022 Setting: Ambulatory Location: Ambulatory outpatient facility Delivery: Face-to-face Provider: Gillis Santa, MD Specialty: Interventional Pain Management Specialty designation: 09 Location: Outpatient facility Ref. Prov.: Gillis Santa, MD    Procedure:           Type: Lumbar Facet, Medial Branch Radiofrequency Ablation (RFA) #1  Laterality: Left (-LT)  Level:L3, L4, L5,  Medial Branch Level(s). These levels will denervate the L3-4, and L4-5lumbar facet joints.  Imaging: Fluoroscopy-guided         Anesthesia: Local anesthesia (1-2% Lidocaine) Sedation:    minimal sedation IV Versed .  DOS: 07/23/2022  Performed by: Gillis Santa, MD  Purpose: Therapeutic/Palliative Indications: Low back pain severe enough to impact quality of life or function. Indications: 1. Lumbar facet arthropathy   2. Lumbar spondylosis   3. Chronic pain syndrome    Sally Hughes has been dealing with the above chronic pain for longer than three months and has either failed to respond, was unable to tolerate, or simply did not get enough benefit from other more conservative therapies including, but not limited to: 1. Over-the-counter medications 2. Anti-inflammatory medications 3. Muscle relaxants 4. Membrane stabilizers 5. Opioids 6. Physical therapy and/or chiropractic manipulation 7. Modalities (Heat, ice, etc.) 8. Invasive techniques such as nerve blocks. Sally Hughes has attained more than 50% relief of the pain  from a series of diagnostic injections conducted in separate occasions.  Pain Score: Pre-procedure: 5 /10 Post-procedure: 5 /10     Position / Prep / Materials:  Position: Prone  Prep solution: DuraPrep (Iodine Povacrylex [0.7% available iodine] and Isopropyl Alcohol, 74% w/w) Prep Area: Entire Lumbosacral Region (Lower back from mid-thoracic region to end of tailbone and from flank to flank.) Materials:  Tray: RFA (Radiofrequency) tray Needle(s):  Type: RFA (Teflon-coated radiofrequency ablation needles) Gauge (G): 20  Length: Long (15cm) Qty: 3  Pre-op H&P Assessment:  Sally Hughes is a 61 y.o. (year old), female patient, seen today for interventional treatment. She  has a past surgical history that includes Tumor removal; Tubal ligation; Cataract extraction w/PHACO (Left, 04/19/2018); Cataract extraction w/PHACO (Right, 05/19/2018); Microlaryngoscopy (Right, 01/13/2020); XI robotic assisted ventral hernia (N/A, 06/13/2020); and Carpometacarpal (cmc) fusion of thumb (Left, 11/27/2021). Sally Hughes has a current medication list which includes the following prescription(s): albuterol, amitriptyline, amitriptyline, apple cider vinegar, true metrix meter, celecoxib, cyanocobalamin, cyclobenzaprine, hydrocodone-acetaminophen, hydroxyzine, meclizine, meloxicam, metformin, metoprolol tartrate, omega-3 fatty acids, omeprazole, pharmacist choice lancets, pravastatin, ropinirole, true metrix blood glucose test, venlafaxine xr, vitamin d, vitamin e, cyclobenzaprine, doxycycline, mometasone-formoterol, and pregabalin. Her primarily concern today is the Back Pain (lower)  Initial Vital Signs:  Pulse/HCG Rate: 97ECG Heart Rate: 80 Temp:  (!) 97.2 F (36.2 C) Resp: 18 BP: 117/72 SpO2: 98 %  BMI: Estimated body mass index is 35.12 kg/m as calculated from the following:   Height as of this encounter: '5\' 2"'$  (1.575 m).   Weight as of this encounter: 192 lb (87.1 kg).  Risk Assessment: Allergies: Reviewed.  She is allergic to buspirone, gabapentin, and pregabalin.  Allergy Precautions: None required Coagulopathies: Reviewed. None identified.  Blood-thinner therapy: None at this time Active Infection(s): Reviewed.  None identified. Sally Hughes is afebrile  Site Confirmation: Sally Hughes was asked to confirm the procedure and laterality before marking the site Procedure checklist: Completed Consent: Before the procedure and under the influence of no sedative(s), amnesic(s), or anxiolytics, the patient was informed of the treatment options, risks and possible complications. To fulfill our ethical and legal obligations, as recommended by the American Medical Association's Code of Ethics, I have informed the patient of my clinical impression; the nature and purpose of the treatment or procedure; the risks, benefits, and possible complications of the intervention; the alternatives, including doing nothing; the risk(s) and benefit(s) of the alternative treatment(s) or procedure(s); and the risk(s) and benefit(s) of doing nothing. The patient was provided information about the general risks and possible complications associated with the procedure. These may include, but are not limited to: failure to achieve desired goals, infection, bleeding, organ or nerve damage, allergic reactions, paralysis, and death. In addition, the patient was informed of those risks and complications associated to Spine-related procedures, such as failure to decrease pain; infection (i.e.: Meningitis, epidural or intraspinal abscess); bleeding (i.e.: epidural hematoma, subarachnoid hemorrhage, or any other type of intraspinal or peri-dural bleeding); organ or nerve damage (i.e.: Any type of peripheral nerve, nerve root, or spinal cord injury) with subsequent damage to sensory, motor, and/or autonomic systems, resulting in permanent pain, numbness, and/or weakness of one or several areas of the body; allergic reactions; (i.e.: anaphylactic  reaction); and/or death. Furthermore, the patient was informed of those risks and complications associated with the medications. These include, but are not limited to: allergic reactions (i.e.: anaphylactic or anaphylactoid reaction(s)); adrenal axis suppression; blood sugar elevation that in diabetics may result in ketoacidosis or comma; water retention that in patients with history of congestive heart failure may result in shortness of breath, pulmonary edema, and decompensation with resultant heart failure; weight gain; swelling or edema; medication-induced neural toxicity; particulate matter embolism and blood vessel occlusion with resultant organ, and/or nervous system infarction; and/or aseptic necrosis of one or more joints. Finally, the patient was informed that Medicine is not an exact science; therefore, there is also the possibility of unforeseen or unpredictable risks and/or possible complications that may result in a catastrophic outcome. The patient indicated having understood very clearly. We have given the patient no guarantees and we have made no promises. Enough time was given to the patient to ask questions, all of which were answered to the patient's satisfaction. Ms. Railey has indicated that she wanted to continue with the procedure. Attestation: I, the ordering provider, attest that I have discussed with the patient the benefits, risks, side-effects, alternatives, likelihood of achieving goals, and potential problems during recovery for the procedure that I have provided informed consent. Date  Time: 07/23/2022  9:18 AM  Pre-Procedure Preparation:  Monitoring: As per clinic protocol. Respiration, ETCO2, SpO2, BP, heart rate and rhythm monitor placed and checked for adequate function Safety Precautions: Patient was assessed for positional comfort and pressure points before starting the procedure. Time-out: I initiated and conducted the "Time-out" before starting the procedure, as per  protocol. The patient was asked to participate by confirming the accuracy of the "Time Out" information. Verification of the correct person, site, and procedure were performed and confirmed by me, the nursing staff, and the patient. "Time-out" conducted as per Joint Commission's Universal Protocol (UP.01.01.01). Time: 0953  Description of Procedure:          Laterality: Left Levels:   L3, L4, L5, Medial Branch Level(s). Safety  Precautions: Aspiration looking for blood return was conducted prior to all injections. At no point did we inject any substances, as a needle was being advanced. Before injecting, the patient was told to immediately notify me if she was experiencing any new onset of "ringing in the ears, or metallic taste in the mouth". No attempts were made at seeking any paresthesias. Safe injection practices and needle disposal techniques used. Medications properly checked for expiration dates. SDV (single dose vial) medications used. After the completion of the procedure, all disposable equipment used was discarded in the proper designated medical waste containers. Local Anesthesia: Protocol guidelines were followed. The patient was positioned over the fluoroscopy table. The area was prepped in the usual manner. The time-out was completed. The target area was identified using fluoroscopy. A 12-in long, straight, sterile hemostat was used with fluoroscopic guidance to locate the targets for each level blocked. Once located, the skin was marked with an approved surgical skin marker. Once all sites were marked, the skin (epidermis, dermis, and hypodermis), as well as deeper tissues (fat, connective tissue and muscle) were infiltrated with a small amount of a short-acting local anesthetic, loaded on a 10cc syringe with a 25G, 1.5-in  Needle. An appropriate amount of time was allowed for local anesthetics to take effect before proceeding to the next step. Technical description of process:   Radiofrequency Ablation (RFA)  L3 Medial Branch Nerve RFA: The target area for the L3 medial branch is at the junction of the postero-lateral aspect of the superior articular process and the superior, posterior, and medial edge of the transverse process of L4. Under fluoroscopic guidance, a Radiofrequency needle was inserted until contact was made with os over the superior postero-lateral aspect of the pedicular shadow (target area).  L4 Medial Branch Nerve RFA: The target area for the L4 medial branch is at the junction of the postero-lateral aspect of the superior articular process and the superior, posterior, and medial edge of the transverse process of L5. Under fluoroscopic guidance, a Radiofrequency needle was inserted until contact was made with os over the superior postero-lateral aspect of the pedicular shadow (target area). Sensory and motor testing was conducted to properly adjust the position of the needle. Once satisfactory placement of the needle was achieved, the numbing solution was slowly injected after negative aspiration for blood. L5 Medial Branch Nerve RFA: The target area for the L5 medial branch is at the junction of the postero-lateral aspect of the superior articular process of S1 and the superior, posterior, and medial edge of the sacral ala. Under fluoroscopic guidance, a Radiofrequency needle was inserted until contact was made with os over the superior postero-lateral aspect of the pedicular shadow (target area). Sensory and motor testing was conducted to properly adjust the position of the needle. Once satisfactory placement of the needle was achieved, the numbing solution was slowly injected after negative aspiration for blood.   At this time, the patient stated that she wanted to stop the procedure.  She states that she was having increased pain in her lower back.  This was after IV Versed as well as additional local anesthetic at site.  Even then, the patient stated that she  wanted to stop the procedure. Procedure aborted due to patient preference   Vitals:   07/23/22 0930 07/23/22 0951 07/23/22 0956 07/23/22 1000  BP: 117/72 101/69 97/67 (!) 121/98  Pulse: 97     Resp:  '18 18 18  '$ Temp: (!) 97.2 F (36.2 C)  SpO2: 98% 99% 98% 95%  Weight: 192 lb (87.1 kg)     Height: '5\' 2"'$  (1.575 m)       Start Time: 0953 hrs. End Time:   hrs.  Imaging Guidance (Spinal):          Type of Imaging Technique: Fluoroscopy Guidance (Spinal) Indication(s): Assistance in needle guidance and placement for procedures requiring needle placement in or near specific anatomical locations not easily accessible without such assistance. Exposure Time: Please see nurses notes. Contrast: None used. Fluoroscopic Guidance: I was personally present during the use of fluoroscopy. "Tunnel Vision Technique" used to obtain the best possible view of the target area. Parallax error corrected before commencing the procedure. "Direction-depth-direction" technique used to introduce the needle under continuous pulsed fluoroscopy. Once target was reached, antero-posterior, oblique, and lateral fluoroscopic projection used confirm needle placement in all planes. Images permanently stored in EMR. Interpretation: No contrast injected. I personally interpreted the imaging intraoperatively. Adequate needle placement confirmed in multiple planes. Permanent images saved into the patient's record.  Antibiotic Prophylaxis:   Anti-infectives (From admission, onward)    None      Indication(s): None identified  Post-operative Assessment:  Post-procedure Vital Signs:  Pulse/HCG Rate: 9787 Temp:  (!) 97.2 F (36.2 C) Resp: 18 BP: (!) 121/98 SpO2: 95 %  EBL: None  Complications: No immediate post-treatment complications observed by team, or reported by patient.  Note: The patient tolerated the entire procedure well. A repeat set of vitals were taken after the procedure and the patient was kept  under observation following institutional policy, for this type of procedure. Post-procedural neurological assessment was performed, showing return to baseline, prior to discharge. The patient was provided with post-procedure discharge instructions, including a section on how to identify potential problems. Should any problems arise concerning this procedure, the patient was given instructions to immediately contact us, at any time, without hesitation. In any case, we plan to contact the patient by telephone for a follow-up status report regarding this interventional procedure.  Comments:  No additional relevant information.  Plan of Care  Patient is receiving approximately 50% pain relief in regards to her right axial low back pain after previous RFA.  Our goal was to perform the left side to provide symmetric pain relief however the patient was unable to tolerate the procedure even with 2 mg of IV Versed.  She expressed that she wanted to discontinue the procedure after needles had been placed before sensorimotor testing was to happen.  This was confirmed with the patient.  Nursing staff and myself tried to reassure the patient and encouraged her to practice deep breathing and I also injected more local anesthetic at injection site to see if that would improve her discomfort however patient did not want to proceed with procedure any further.   Follow-up plan:   Return for follow up with PCP.       Right L3,4,5 RFA 06/30/22, attempted left L3, L4, L5 RFA with IV Versed but procedure aborted due to patient discomfort and preference to stop.    Recent Visits Date Type Provider Dept  06/30/22 Procedure visit Gillis Santa, MD [facility]  06/11/22 Office Visit Gillis Santa, MD [facility]  04/30/22 Procedure visit Gillis Santa, MD [facility]  Showing recent visits within past 90 days and meeting all other requirements Today's Visits Date Type Provider Dept  07/23/22  Procedure visit Gillis Santa, MD [facility]  Showing today's visits and meeting all other requirements  Future Appointments No visits were found meeting these conditions. Showing future appointments within next 90 days and meeting all other requirements  Disposition: Discharge home  Discharge (Date  Time): 07/23/2022;   hrs.   Primary Care Physician: Gladstone Lighter, MD Location: [facility] Note by: Gillis Santa, MD Date: 07/23/2022; Time: 10:07 AM  Disclaimer:  Medicine is not an exact science. The only guarantee in medicine is that nothing is guaranteed. It is important to note that the decision to proceed with this intervention was based on the information collected from the patient. The Data and conclusions were drawn from the patient's questionnaire, the interview, and the physical examination. Because the information was provided in large part by the patient, it cannot be guaranteed that it has not been purposely or unconsciously manipulated. Every effort has been made to obtain as much relevant data as possible for this evaluation. It is important to note that the conclusions that lead to this procedure are derived in large part from the available data. Always take into account that the treatment will also be dependent on availability of resources and existing treatment guidelines, considered by other Pain Management Practitioners as being common knowledge and practice, at the time of the intervention. For Medico-Legal purposes, it is also important to point out that variation in procedural techniques and pharmacological choices are the acceptable norm. The indications, contraindications, technique, and results of the above procedure should only be interpreted and judged by a Board-Certified Interventional Pain Specialist with extensive familiarity and expertise in the same exact procedure and technique.

## 2022-07-24 ENCOUNTER — Telehealth: Payer: Self-pay | Admitting: *Deleted

## 2022-07-24 NOTE — Telephone Encounter (Signed)
Post procedure call;  voicemail left.  

## 2022-07-29 ENCOUNTER — Ambulatory Visit: Payer: Medicare Other | Attending: Internal Medicine

## 2022-09-19 ENCOUNTER — Encounter: Payer: Self-pay | Admitting: *Deleted

## 2022-10-06 ENCOUNTER — Telehealth: Payer: Self-pay | Admitting: *Deleted

## 2022-10-06 ENCOUNTER — Other Ambulatory Visit: Payer: Self-pay | Admitting: *Deleted

## 2022-10-06 DIAGNOSIS — Z122 Encounter for screening for malignant neoplasm of respiratory organs: Secondary | ICD-10-CM

## 2022-10-06 DIAGNOSIS — Z87891 Personal history of nicotine dependence: Secondary | ICD-10-CM

## 2022-10-06 DIAGNOSIS — F17210 Nicotine dependence, cigarettes, uncomplicated: Secondary | ICD-10-CM

## 2022-10-06 NOTE — Telephone Encounter (Signed)
Left message for pt to call back to reschedule lung cancer screening CT appt.

## 2022-10-13 ENCOUNTER — Ambulatory Visit: Payer: Medicare Other

## 2022-10-20 ENCOUNTER — Other Ambulatory Visit: Payer: Self-pay | Admitting: Orthopedic Surgery

## 2022-10-22 ENCOUNTER — Encounter: Payer: Self-pay | Admitting: Orthopedic Surgery

## 2022-10-22 ENCOUNTER — Ambulatory Visit: Payer: Medicare Other | Attending: Acute Care

## 2022-10-28 ENCOUNTER — Ambulatory Visit: Payer: Medicare Other | Admitting: Anesthesiology

## 2022-10-28 ENCOUNTER — Encounter
Admission: RE | Disposition: A | Discharge status: Home / Self Care | Payer: Self-pay | Source: Home / Self Care | Attending: Orthopedic Surgery

## 2022-10-28 ENCOUNTER — Ambulatory Visit
Admission: RE | Admit: 2022-10-28 | Discharge: 2022-10-28 | Disposition: A | Discharge status: Home / Self Care | Payer: Medicare Other | Attending: Orthopedic Surgery | Admitting: Orthopedic Surgery

## 2022-10-28 ENCOUNTER — Other Ambulatory Visit: Payer: Self-pay

## 2022-10-28 ENCOUNTER — Encounter: Payer: Self-pay | Admitting: Orthopedic Surgery

## 2022-10-28 DIAGNOSIS — M19031 Primary osteoarthritis, right wrist: Secondary | ICD-10-CM | POA: Diagnosis present

## 2022-10-28 DIAGNOSIS — F32A Depression, unspecified: Secondary | ICD-10-CM | POA: Insufficient documentation

## 2022-10-28 DIAGNOSIS — M67431 Ganglion, right wrist: Secondary | ICD-10-CM | POA: Diagnosis not present

## 2022-10-28 DIAGNOSIS — E119 Type 2 diabetes mellitus without complications: Secondary | ICD-10-CM | POA: Diagnosis not present

## 2022-10-28 DIAGNOSIS — Z7984 Long term (current) use of oral hypoglycemic drugs: Secondary | ICD-10-CM | POA: Diagnosis not present

## 2022-10-28 DIAGNOSIS — I10 Essential (primary) hypertension: Secondary | ICD-10-CM | POA: Diagnosis not present

## 2022-10-28 DIAGNOSIS — Z6835 Body mass index (BMI) 35.0-35.9, adult: Secondary | ICD-10-CM | POA: Insufficient documentation

## 2022-10-28 DIAGNOSIS — F419 Anxiety disorder, unspecified: Secondary | ICD-10-CM | POA: Diagnosis not present

## 2022-10-28 DIAGNOSIS — G619 Inflammatory polyneuropathy, unspecified: Secondary | ICD-10-CM | POA: Diagnosis not present

## 2022-10-28 DIAGNOSIS — J449 Chronic obstructive pulmonary disease, unspecified: Secondary | ICD-10-CM | POA: Insufficient documentation

## 2022-10-28 HISTORY — PX: GANGLION CYST EXCISION: SHX1691

## 2022-10-28 HISTORY — DX: Sleep apnea, unspecified: G47.30

## 2022-10-28 LAB — GLUCOSE, CAPILLARY: Glucose-Capillary: 109 mg/dL — ABNORMAL HIGH (ref 70–99)

## 2022-10-28 SURGERY — EXCISION, GANGLION CYST, WRIST
Anesthesia: General | Site: Wrist | Laterality: Right

## 2022-10-28 MED ORDER — CEFAZOLIN SODIUM-DEXTROSE 2-4 GM/100ML-% IV SOLN
2.0000 g | INTRAVENOUS | Status: AC
  Administered 2022-10-28: 2 g via INTRAVENOUS

## 2022-10-28 MED ORDER — METOCLOPRAMIDE HCL 5 MG PO TABS: 5.0000 mg | ORAL_TABLET | Freq: Three times a day (TID) | ORAL | Status: DC | PRN

## 2022-10-28 MED ORDER — MORPHINE SULFATE (PF) 2 MG/ML IV SOLN: 0.5000 mg | INTRAVENOUS | Status: DC | PRN

## 2022-10-28 MED ORDER — EPHEDRINE SULFATE (PRESSORS) 50 MG/ML IJ SOLN: INTRAMUSCULAR | Status: DC | PRN

## 2022-10-28 MED ORDER — FENTANYL CITRATE PF 50 MCG/ML IJ SOSY: 25.0000 ug | PREFILLED_SYRINGE | INTRAMUSCULAR | Status: DC | PRN

## 2022-10-28 MED ORDER — BUPIVACAINE HCL 0.5 % IJ SOLN
INTRAMUSCULAR | Status: DC | PRN
  Administered 2022-10-28: 10 mL

## 2022-10-28 MED ORDER — SODIUM CHLORIDE 0.9 % IV SOLN
800.0000 mg | Freq: Once | INTRAVENOUS | Status: AC
  Administered 2022-10-28: 800 mg via INTRAVENOUS

## 2022-10-28 MED ORDER — PROPOFOL 10 MG/ML IV BOLUS
INTRAVENOUS | Status: DC | PRN
  Administered 2022-10-28: 150 mg via INTRAVENOUS
  Administered 2022-10-28: 25 mg via INTRAVENOUS
  Administered 2022-10-28: 200 mg via INTRAVENOUS

## 2022-10-28 MED ORDER — LACTATED RINGERS IV SOLN: INTRAVENOUS | Status: DC

## 2022-10-28 MED ORDER — ACETAMINOPHEN 10 MG/ML IV SOLN
INTRAVENOUS | Status: DC | PRN
  Administered 2022-10-28: 1000 mg via INTRAVENOUS

## 2022-10-28 MED ORDER — DEXMEDETOMIDINE HCL IN NACL 80 MCG/20ML IV SOLN
INTRAVENOUS | Status: DC | PRN
  Administered 2022-10-28: 6 ug via BUCCAL

## 2022-10-28 MED ORDER — HYDROCODONE-ACETAMINOPHEN 7.5-325 MG PO TABS: 1.0000 | ORAL_TABLET | ORAL | Status: DC | PRN

## 2022-10-28 MED ORDER — 0.9 % SODIUM CHLORIDE (POUR BTL) OPTIME
TOPICAL | Status: DC | PRN
  Administered 2022-10-28: 60 mL

## 2022-10-28 MED ORDER — METOCLOPRAMIDE HCL 5 MG/ML IJ SOLN: 5.0000 mg | Freq: Three times a day (TID) | INTRAMUSCULAR | Status: DC | PRN

## 2022-10-28 MED ORDER — LIDOCAINE HCL (CARDIAC) PF 100 MG/5ML IV SOSY
PREFILLED_SYRINGE | INTRAVENOUS | Status: DC | PRN
  Administered 2022-10-28: 100 mg via INTRATRACHEAL

## 2022-10-28 MED ORDER — ONDANSETRON HCL 4 MG PO TABS: 4.0000 mg | ORAL_TABLET | Freq: Four times a day (QID) | ORAL | Status: DC | PRN

## 2022-10-28 MED ORDER — DEXAMETHASONE SODIUM PHOSPHATE 4 MG/ML IJ SOLN
INTRAMUSCULAR | Status: DC | PRN
  Administered 2022-10-28: 12 mg via INTRAVENOUS

## 2022-10-28 MED ORDER — SODIUM CHLORIDE 0.9 % IV SOLN: INTRAVENOUS | Status: DC

## 2022-10-28 MED ORDER — HYDROCODONE-ACETAMINOPHEN 5-325 MG PO TABS: 1.0000 | ORAL_TABLET | ORAL | Status: DC | PRN

## 2022-10-28 MED ORDER — HYDROCODONE-ACETAMINOPHEN 5-325 MG PO TABS: 1.0000 | ORAL_TABLET | ORAL | 0 refills | Status: DC | PRN

## 2022-10-28 MED ORDER — ACETAMINOPHEN 325 MG PO TABS: 325.0000 mg | ORAL_TABLET | Freq: Four times a day (QID) | ORAL | Status: DC | PRN

## 2022-10-28 MED ORDER — EPHEDRINE SULFATE (PRESSORS) 50 MG/ML IJ SOLN
INTRAMUSCULAR | Status: DC | PRN
  Administered 2022-10-28 (×2): 25 mg via INTRAVENOUS

## 2022-10-28 MED ORDER — ONDANSETRON HCL 4 MG/2ML IJ SOLN
4.0000 mg | Freq: Four times a day (QID) | INTRAMUSCULAR | Status: DC | PRN
  Administered 2022-10-28: 4 mg via INTRAVENOUS

## 2022-10-28 SURGICAL SUPPLY — 19 items
APL PRP STRL LF DISP 70% ISPRP (MISCELLANEOUS) ×1
BNDG CMPR STD VLCR NS LF 5.8X3 (GAUZE/BANDAGES/DRESSINGS) ×1
BNDG ELASTIC 3X5.8 VLCR NS LF (GAUZE/BANDAGES/DRESSINGS) ×1 IMPLANT
CHLORAPREP W/TINT 26 (MISCELLANEOUS) ×1 IMPLANT
COVER LIGHT HANDLE UNIVERSAL (MISCELLANEOUS) IMPLANT
GAUZE SPONGE 4X4 12PLY STRL (GAUZE/BANDAGES/DRESSINGS) ×1 IMPLANT
GAUZE XEROFORM 1X8 LF (GAUZE/BANDAGES/DRESSINGS) ×1 IMPLANT
GLOVE SURG SYN 9.0  PF PI (GLOVE) ×2
GLOVE SURG SYN 9.0 PF PI (GLOVE) ×1 IMPLANT
GOWN STRL REUS W/ TWL LRG LVL3 (GOWN DISPOSABLE) ×1 IMPLANT
GOWN STRL REUS W/TWL LRG LVL3 (GOWN DISPOSABLE) ×1
KIT TURNOVER KIT A (KITS) ×1 IMPLANT
NS IRRIG 500ML POUR BTL (IV SOLUTION) ×1 IMPLANT
PACK EXTREMITY ARMC (MISCELLANEOUS) ×1 IMPLANT
PADDING CAST BLEND 3X4 STRL (MISCELLANEOUS) IMPLANT
SPLINT CAST 1 STEP 3X12 (MISCELLANEOUS) ×1 IMPLANT
SUT ETHILON 4-0 (SUTURE) ×1
SUT ETHILON 4-0 FS2 18XMFL BLK (SUTURE) ×1
SUTURE ETHLN 4-0 FS2 18XMF BLK (SUTURE) ×1 IMPLANT

## 2022-10-28 NOTE — Transfer of Care (Signed)
Immediate Anesthesia Transfer of Care Note  Patient: Sally Hughes  Procedure(s) Performed: REMOVAL GANGLION OF WRIST (Right: Wrist)  Patient Location: PACU  Anesthesia Type: General  Level of Consciousness: awake, alert  and patient cooperative  Airway and Oxygen Therapy: Patient Spontanous Breathing and Patient connected to supplemental oxygen  Post-op Assessment: Post-op Vital signs reviewed, Patient's Cardiovascular Status Stable, Respiratory Function Stable, Patent Airway and No signs of Nausea or vomiting  Post-op Vital Signs: Reviewed and stable  Complications: No notable events documented.

## 2022-10-28 NOTE — Discharge Instructions (Signed)
Keep arm elevated is much as possible Keep splint clean and dry and do not remove Pain medicine as directed Work on finger motion is much as you can Call office if you are having problems [phone]

## 2022-10-28 NOTE — Op Note (Signed)
10/28/2022  12:56 PM  PATIENT:  Sally Hughes  61 y.o. female  PRE-OPERATIVE DIAGNOSIS:  Ganglion of right wrist M67.431  POST-OPERATIVE DIAGNOSIS:  Ganglion of right wrist  PROCEDURE:  Procedure(s): REMOVAL GANGLION OF WRIST (Right)  SURGEON: Laurene Footman, MD  ASSISTANTS: None  ANESTHESIA:   general  EBL:  No intake/output data recorded.  BLOOD ADMINISTERED:none  DRAINS: none   LOCAL MEDICATIONS USED:  MARCAINE     SPECIMEN:  No Specimen  DISPOSITION OF SPECIMEN:  N/A  COUNTS:  YES  TOURNIQUET:   Total Tourniquet Time Documented: Upper Arm (Right) - 5 minutes Total: Upper Arm (Right) - 5 minutes   IMPLANTS: None  DICTATION: .Dragon Dictation patient was brought to the operating room and after general anesthesia was obtained the right arm was prepped and draped in the usual sterile manner with a tourniquet applied the upper arm.  After patient identification and timeout procedures were completed tourniquet was raised.  Curvilinear incision was made over the volar radial wrist centered over the very large ganglion cyst.  Subcutaneous tissue spread preserving the radial artery with associated veins and the stalk of the ganglion cyst was identified at the radiocarpal joint and opened.  The cyst was then removed and at the base of the cyst the hole was cauterized and abraded with use of a Freer elevator to try because scar tissue and prevent recurrence.  The wound was then irrigated and infiltrated 10 cc of half percent Sensorcaine.  Tourniquet is let down and there is no significant bleeding.  The wound was closed with simple erupted 4-0 nylon followed by Xeroform 4 x 4 web roll and a volar splint and an Ace wrap.  PLAN OF CARE: Discharge to home after PACU  PATIENT DISPOSITION:  PACU - hemodynamically stable.

## 2022-10-28 NOTE — Anesthesia Procedure Notes (Signed)
Procedure Name: LMA Insertion Date/Time: 10/28/2022 12:27 PM  Performed by: Candice Camp, CRNAPre-anesthesia Checklist: Timeout performed, Patient being monitored, Suction available, Emergency Drugs available and Patient identified Patient Re-evaluated:Patient Re-evaluated prior to induction Oxygen Delivery Method: Circle system utilized Preoxygenation: Pre-oxygenation with 100% oxygen Induction Type: IV induction Ventilation: Mask ventilation without difficulty LMA: LMA inserted LMA Size: 4.0 Number of attempts: 1 Placement Confirmation: positive ETCO2 Dental Injury: Teeth and Oropharynx as per pre-operative assessment

## 2022-10-28 NOTE — Anesthesia Preprocedure Evaluation (Signed)
Anesthesia Evaluation  Patient identified by MRN, date of birth, ID band Patient awake    Reviewed: Allergy & Precautions, NPO status , Patient's Chart, lab work & pertinent test results  History of Anesthesia Complications Negative for: history of anesthetic complications  Airway Mallampati: II  TM Distance: >3 FB Neck ROM: Full   Comment: Prior grade 1 view with miller 2 Dental  (+) Edentulous Upper, Edentulous Lower, Dental Advisory Given   Pulmonary shortness of breath and with exertion, asthma , neg sleep apnea, COPD,  COPD inhaler, neg recent URI, Current SmokerPatient did not abstain from smoking. Inhalers only PRN, takes couple times per month. Took one today  Possible undiagnosed OSA; patient says her doctor wants her to get a sleep study   Pulmonary exam normal breath sounds clear to auscultation       Cardiovascular Exercise Tolerance: Poor METS: 3 - Mets hypertension, (-) angina (-) CAD and (-) Past MI (-) dysrhythmias  Rhythm:Regular Rate:Normal - Systolic murmurs    Neuro/Psych  Headaches PSYCHIATRIC DISORDERS Anxiety Depression       GI/Hepatic ,GERD  Medicated and Controlled,,(+)     (-) substance abuse  , Hepatitis -, CTreated w harvoni   Endo/Other  diabetes, Oral Hypoglycemic Agents    Renal/GU negative Renal ROS     Musculoskeletal  (+) Arthritis ,    Abdominal  (+) + obese  Peds  Hematology   Anesthesia Other Findings Past Medical History: No date: Arthritis     Comment:  whole body No date: Asthma     Comment:  inhaler in summer No date: Back pain No date: Bell's palsy     Comment:  12 yrs ago, right eye weaker No date: COPD (chronic obstructive pulmonary disease) (HCC)     Comment:  wheezing No date: Depression No date: Diabetes mellitus, type 2 (HCC) No date: Dyspnea No date: Headache     Comment:  about everyday No date: Hepatitis C No date: Hypertension No date: Kidney tumor  (benign), left No date: Pneumonia     Comment:  in past No date: Snake bite     Comment:  as a child  Reproductive/Obstetrics                             Anesthesia Physical Anesthesia Plan  ASA: 3  Anesthesia Plan: General   Post-op Pain Management:    Induction: Intravenous  PONV Risk Score and Plan: 3 and Ondansetron, Dexamethasone, Midazolam and Treatment may vary due to age or medical condition  Airway Management Planned: LMA  Additional Equipment: None  Intra-op Plan:   Post-operative Plan: Extubation in OR  Informed Consent: I have reviewed the patients History and Physical, chart, labs and discussed the procedure including the risks, benefits and alternatives for the proposed anesthesia with the patient or authorized representative who has indicated his/her understanding and acceptance.     Dental advisory given  Plan Discussed with: CRNA and Surgeon  Anesthesia Plan Comments: (Discussed risks of anesthesia with patient, including PONV, sore throat, lip/dental/eye damage. Rare risks discussed as well, such as cardiorespiratory and neurological sequelae, and allergic reactions. Discussed the role of CRNA in patient's perioperative care. Patient understands.)        Anesthesia Quick Evaluation

## 2022-10-28 NOTE — Anesthesia Postprocedure Evaluation (Signed)
Anesthesia Post Note  Patient: Sally Hughes  Procedure(s) Performed: REMOVAL GANGLION OF WRIST (Right: Wrist)  Patient location during evaluation: PACU Anesthesia Type: General Level of consciousness: awake and alert Pain management: pain level controlled Vital Signs Assessment: post-procedure vital signs reviewed and stable Respiratory status: spontaneous breathing, nonlabored ventilation, respiratory function stable and patient connected to nasal cannula oxygen Cardiovascular status: blood pressure returned to baseline and stable Postop Assessment: no apparent nausea or vomiting Anesthetic complications: no   No notable events documented.   Last Vitals:  Vitals:   10/28/22 1309 10/28/22 1310  BP:  95/80  Pulse: 80 78  Resp: 17 (!) 8  Temp:  (!) 36.3 C  SpO2: 95% 94%    Last Pain:  Vitals:   10/28/22 1301  TempSrc:   PainSc: 0-No pain                 Martha Clan

## 2022-10-28 NOTE — H&P (Signed)
Chief Complaint Patient presents with Right Wrist - Pain   History of the Present Illness: Sally Hughes is a 61 y.o. female here today for initial evaluation of a ganglion cyst on the right wrist.  The patient states the cyst started growing in size in the right wrist. She has had surgery on her left hand in the past. She has not had a right wrist x-ray.  The patient is right-hand dominant. She has dentures, and she does not wear them.  The patient lives in [place], [place].  I have reviewed past medical, surgical, social and family history, and allergies as documented in the EMR.  Past Medical History: Past Medical History: Diagnosis Date Anxiety Anxiety COPD (chronic obstructive pulmonary disease) (CMS-HCC) Depression (emotion) Diabetes mellitus without complication (CMS-HCC) Hypertension Low back pain Migraine headache  Past Surgical History: Past Surgical History: Procedure Laterality Date HERNIA REPAIR 06/13/2020 Dr Lesli Albee -- Inscisional -- ROBOTIC Suspension arthroplasty left thumb [facility] joint Left 11/27/2021 Dr.Poggi EXPLORATION KIDNEY EYELID SURGERY  Past Family History: Family History Problem Relation Age of Onset Deep vein thrombosis (DVT or abnormal blood clot formation) Mother Diabetes Mother Coronary Artery Disease (Blocked arteries around heart) Father Cancer Father Cancer Brother  Medications: Current Outpatient Medications Ordered in Epic Medication Sig Dispense Refill amitriptyline (ELAVIL) 100 MG tablet Take 1 tablet (100 mg total) by mouth at bedtime 90 tablet 1 ascorbic acid, vitamin C, (VITAMIN C) 1000 MG tablet Take 1,000 mg by mouth once daily celecoxib (CELEBREX) 200 MG capsule TAKE ONE CAPSULE BY MOUTH TWICE DAILY 60 capsule 0 cyanocobalamin, vitamin B-12, (VITAMIN B-12 ORAL) Take by mouth cyclobenzaprine (FLEXERIL) 10 MG tablet Take 1 tablet (10 mg total) by mouth 2 (two) times daily 120 tablet 2 docosahexaenoic acid/epa  (FISH OIL ORAL) Take by mouth fluticasone-umeclidinium-vilanterol (TRELEGY ELLIPTA) 200-62.5-25 mcg inhaler Inhale 1 inhalation into the lungs once daily 28 each 2 hydrOXYzine (VISTARIL) 50 MG capsule TAKE (1) CAPSULE BY MOUTH DAILY AT BEDTIME AS NEEDED FOR ITCHING 90 capsule 0 meclizine (ANTIVERT) 25 mg tablet Take 1 tablet (25 mg total) by mouth once daily as needed for Dizziness 90 tablet 0 meloxicam (MOBIC) 15 MG tablet Take 1 tablet (15 mg total) by mouth once daily as needed for Pain 30 tablet 2 metFORMIN (GLUCOPHAGE-XR) 500 MG XR tablet Take 1 tablet (500 mg total) by mouth daily with dinner 90 tablet 1 metoprolol tartrate (LOPRESSOR) 25 MG tablet Take 1 tablet (25 mg total) by mouth 2 (two) times daily 180 tablet 1 omeprazole (PRILOSEC) 20 MG DR capsule Take 1 capsule (20 mg total) by mouth once daily 90 capsule 1 pravastatin (PRAVACHOL) 40 MG tablet Take 1 tablet (40 mg total) by mouth at bedtime 90 tablet 1 rOPINIRole (REQUIP) 0.5 MG tablet Take 1 tablet (0.5 mg total) by mouth at bedtime 90 tablet 1 TRUE METRIX GLUCOSE TEST STRIP test strip 3 (three) times daily 200 each 3 venlafaxine (EFFEXOR-XR) 75 MG XR capsule Take 1 capsule (75 mg total) by mouth 2 (two) times daily 180 capsule 2 VITAMIN D3 ORAL Take by mouth  Current Facility-Administered Medications Ordered in Epic Medication Dose Route Frequency Provider Last Rate Last Admin lidocaine-EPINEPHrine (PF) 1.5 %-1:200,000 injection PRN Orest Dikes, MD 45 mg at 07/16/17 0725  Allergies: Allergies Allergen Reactions Buspirone Other (See Comments) confused Gabapentin Hives and Other (See Comments) Involuntary jerking and hives Lyrica [Pregabalin] Other (See Comments) Mental changes   Body mass index is 35.25 kg/m.  Review of Systems: A comprehensive 14 point ROS  was performed, reviewed, and the pertinent orthopaedic findings are documented in the HPI.  Vitals: 10/15/22 0922 BP: 114/68   General Physical  Examination:  General/Constitutional: No apparent distress: well-nourished and well developed. Eyes: Pupils equal, round with synchronous movement. Lungs: Clear to auscultation HEENT: Normal Vascular: No edema, swelling or tenderness, except as noted in detailed exam. Cardiac: Heart rate and rhythm is regular. Integumentary: No impressive skin lesions present, except as noted in detailed exam. Neuro/Psych: Normal mood and affect, oriented to person, place and time.  Musculoskeletal Examination: On exam, right wrist ganglion cyst 2 cm diameter, volar. Allen test shows radial dominance.  Radiographs:  AP, lateral, and oblique x-rays of the right wrist were ordered and personally reviewed today. These show mild CMC arthritis, otherwise normal wrist.  Assessment: ICD-10-CM 1. 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 Morbid (severe) obesity due to excess calories (CMS-HCC) E66.01  Plan:  The patient has clinical findings of right wrist ganglion cyst.  We discussed the patient's prior x-ray findings. I recommend right wrist ganglion cyst excision. I explained the surgery and postoperative course in detail.  We will schedule the patient for surgery in the near future.  Surgical Risks:  The nature of the condition and the proposed procedure has been reviewed in detail with the patient. Surgical versus non-surgical options and prognosis for recovery have been reviewed and the inherent risks and benefits of each have been discussed including the risks of infection, bleeding, injury to nerves/blood vessels/tendons, incomplete relief of symptoms, persisting pain and/or stiffness, loss of function, complex regional pain syndrome, failure of the procedure, as appropriate.  Teeth: * * *  Document Attestation: I, Tamil Thirumalaisamy, have reviewed and updated documentation for [facility], MD, utilizing Nuance DAX.   Electronically signed by  Lauris Poag, MD at 10/16/2022 12:29 PM EST  Reviewed  H+P. No changes noted.

## 2022-10-29 ENCOUNTER — Encounter: Payer: Self-pay | Admitting: Orthopedic Surgery

## 2022-10-31 ENCOUNTER — Other Ambulatory Visit: Payer: Self-pay | Admitting: Internal Medicine

## 2022-10-31 DIAGNOSIS — Z1231 Encounter for screening mammogram for malignant neoplasm of breast: Secondary | ICD-10-CM

## 2022-11-13 ENCOUNTER — Ambulatory Visit (INDEPENDENT_AMBULATORY_CARE_PROVIDER_SITE_OTHER): Payer: Medicare Other | Admitting: Pulmonary Disease

## 2022-11-13 ENCOUNTER — Ambulatory Visit
Admission: RE | Admit: 2022-11-13 | Discharge: 2022-11-13 | Disposition: A | Discharge status: Home / Self Care | Payer: Medicare Other | Source: Ambulatory Visit | Attending: Acute Care | Admitting: Acute Care

## 2022-11-13 ENCOUNTER — Encounter: Payer: Self-pay | Admitting: Pulmonary Disease

## 2022-11-13 VITALS — BP 122/82 | HR 75 | Temp 98.3°F | Ht 63.0 in | Wt 194.6 lb

## 2022-11-13 DIAGNOSIS — Z122 Encounter for screening for malignant neoplasm of respiratory organs: Secondary | ICD-10-CM | POA: Insufficient documentation

## 2022-11-13 DIAGNOSIS — J449 Chronic obstructive pulmonary disease, unspecified: Secondary | ICD-10-CM

## 2022-11-13 DIAGNOSIS — J84115 Respiratory bronchiolitis interstitial lung disease: Secondary | ICD-10-CM | POA: Diagnosis not present

## 2022-11-13 DIAGNOSIS — F17210 Nicotine dependence, cigarettes, uncomplicated: Secondary | ICD-10-CM

## 2022-11-13 DIAGNOSIS — Z87891 Personal history of nicotine dependence: Secondary | ICD-10-CM | POA: Diagnosis not present

## 2022-11-13 MED ORDER — ALBUTEROL SULFATE HFA 108 (90 BASE) MCG/ACT IN AERS: 2.0000 | INHALATION_SPRAY | Freq: Four times a day (QID) | RESPIRATORY_TRACT | 0 refills | Status: AC | PRN

## 2022-11-13 NOTE — Patient Instructions (Signed)
We will call you the results of your CT when they are available.  We have sent the refill for your albuterol to the pharmacy.  We will see you in follow-up in 4 months time call sooner should any new problems arise.

## 2022-11-13 NOTE — Progress Notes (Signed)
Subjective:    Patient ID: Sally Hughes, female    DOB: 23-Oct-1961, 62 y.o.   MRN: [id number] Patient Care Team: Gladstone Lighter, MD as PCP - General (Internal Medicine)  Chief Complaint  Patient presents with   Follow-up    Breathing is fine. SOB with exertion. No wheezing. Little cough with white sputum.   HPI The patient is a 62 year old current smoker 3 PPD max, now 1.5 PPD (90 PY) presents forfollow up of abnormal lung cancer screening CT due to  to respiratory bronchiolitis associated ILD.  She was initially evaluated on 09 September 2021 and failed to follow-up as directed after that.  She also notes shortness of breath.  She has had shortness of breath for a number of years.  She has been smoking for over 30 years at maximum 3 packs/day now has decreased to 1.5 pack/day.  All in all she has approximately 90 pack-year history of smoking.  She is not using inhalers regularly.  She states that she does not think they help though she has never really given her inhalers an adequate trial.  The only inhaler she uses regularly is albuterol.  She does not have any hemoptysis or purulent sputum production.  No fevers, chills or sweats.  She is for the most part sedentary.  She has no motivation to quit smoking.  Patient does not endorse any other symptomatology.   She is to have a follow-up low-dose CT chest today.  We will contact her with the results once these are known.  DATA: 04/10/2015 PFTs: FEV1 1.62 L or 72% predicted, FVC 2.59 L or 86% predicted, FEV1/FVC 63%.  There is significant bronchodilator response with FEV1 normalizing to 93% for a total of 29% net change.  Findings are consistent with mild COPD with significant bronchodilator response. 07/29/2021 LDCT:Lung-RADS 2S, benign appearance or behavior. Continue annual screening with low-dose chest CT without contrast in 12 months. 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  Review of Systems A 10  point review of systems was performed and it is as noted above otherwise negative.  Patient Active Problem List   Diagnosis Date Noted   Lumbar spondylosis 02/11/2022   Lumbar degenerative disc disease 02/11/2022   Chronic migraine without aura without status migrainosus, not intractable 02/11/2022   Noncompliance with treatment regimen 09/14/2020   Tobacco use disorder 08/14/2020   MDD (major depressive disorder), recurrent episode, moderate ([place]) 07/06/2020   GAD (generalized anxiety disorder) 07/06/2020   RLS (restless legs syndrome) 07/06/2020   Headache disorder 06/01/2020   Loss of memory 08/04/2019   Multiple fractures of ribs, bilateral, subsequent encounter for fracture with routine healing 08/10/2017   Social History   Tobacco Use   Smoking status: Every Day    Packs/day: 1.00    Years: 30.00    Total pack years: 30.00    Types: Cigarettes   Smokeless tobacco: Never   Tobacco comments:    1.5 packs a day, sometimes 2 packs depending on stress- 11/13/2022  Substance Use Topics   Alcohol use: No   Allergies  Allergen Reactions   Buspirone     confused   Gabapentin Hives and Other (See Comments)    Involuntary jerking and hives   Pregabalin Other (See Comments)    Mental changes   Current Meds  Medication Sig   albuterol (VENTOLIN HFA) 108 (90 Base) MCG/ACT inhaler Inhale 2 puffs into the lungs every 6 (six) hours as needed for wheezing or shortness of breath.  amitriptyline (ELAVIL) 100 MG tablet Take 100 mg by mouth at bedtime.   Blood Glucose Monitoring Suppl (TRUE METRIX METER) DEVI SMARTSIG:1 Unit(s) Via Meter Daily   celecoxib (CELEBREX) 200 MG capsule Take 1 capsule (200 mg total) by mouth daily.   Cyanocobalamin (VITAMIN B12 PO) Take 1 tablet by mouth daily.    doxycycline (VIBRAMYCIN) 100 MG capsule Take by mouth. Take 1 capsule (100 mg total) by mouth 2 (two) times daily for 10 days   meclizine (ANTIVERT) 25 MG tablet Take 25 mg by mouth 3 (three) times  daily as needed for dizziness or nausea.    metFORMIN (GLUCOPHAGE-XR) 500 MG 24 hr tablet Take 250-500 mg by mouth See admin instructions. Take 500 mg by mouth daily in the morning and may take an additional 250 mg if needed for high blood sugar   metoprolol tartrate (LOPRESSOR) 25 MG tablet Take 25 mg by mouth in the morning and at bedtime.   Omega-3 Fatty Acids (FISH OIL PO) Take 1 capsule by mouth daily.   omeprazole (PRILOSEC) 20 MG capsule Take 20 mg by mouth in the morning.   Pharmacist Choice Lancets MISC Apply topically as needed.    pravastatin (PRAVACHOL) 40 MG tablet Take 40 mg by mouth at bedtime.   predniSONE (DELTASONE) 20 MG tablet Take 2 tablets by mouth daily for 4 days followed by 1 tablet by mouth daily for 4 days and stop 12   rOPINIRole (REQUIP) 0.5 MG tablet TAKE (1) TABLET BY MOUTH DAILY AT BEDTIME FOR RESTLESS LEGS.   TRUE METRIX BLOOD GLUCOSE TEST test strip SMARTSIG:1 Via Meter 4-5 Times Daily   venlafaxine XR (EFFEXOR-XR) 75 MG 24 hr capsule Take 75 mg by mouth in the morning and at bedtime.   VITAMIN D PO Take 1 tablet by mouth daily.    vitamin E 180 MG (400 UNITS) capsule Take 400 Units by mouth daily.   Immunization History  Administered Date(s) Administered   Moderna Sars-Covid-2 Vaccination 01/25/2020, 02/22/2020   Tdap 07/15/2017, 06/05/2018, 06/10/2019   Zoster Recombinat (Shingrix) 06/10/2019, 09/01/2019       Objective:   Physical Exam BP 122/82 (BP Location: Left Arm, Cuff Size: Normal)   Pulse 75   Temp 98.3 F (36.8 C)   Ht '5\' 3"'$  (1.6 m)   Wt 194 lb 9.6 oz (88.3 kg)   SpO2 95%   BMI 34.47 kg/m   SpO2: 95 % O2 Device: None (Room air)  GENERAL: Obese woman, no acute distress, fully ambulatory.  No conversational dyspnea. HEAD: Normocephalic, atraumatic.  EYES: Pupils equal, round, reactive to light.  No scleral icterus.  MOUTH: Poor dentition, oral mucosa moist.  No thrush. NECK: Supple. No thyromegaly. Trachea midline. No JVD.  No  adenopathy. PULMONARY: Good air entry bilaterally.  Coarse, otherwise, no adventitious sounds. CARDIOVASCULAR: S1 and S2. Regular rate and rhythm.  No rubs, murmurs or gallops heard. ABDOMEN: Obese otherwise benign. MUSCULOSKELETAL: No joint deformity, no clubbing, no edema.  NEUROLOGIC: No focal deficit, no gait disturbance, speech is fluent. SKIN: Intact,warm,dry. PSYCH: Flat affect, behavior normal.      Assessment & Plan:     ICD-10-CM   1. Stage 1 mild COPD by GOLD classification ([place])  J44.9    Patient requests refill on albuterol which is the only inhaler she uses We refilled albuterol    2. 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 Heavy cigarette smoker (20-39 per day)  F17.210    Counseled regards to discontinuation of smoking Total counseling time 3 to 5 minutes     Meds ordered this encounter  Medications   albuterol (VENTOLIN HFA) 108 (90 Base) MCG/ACT inhaler    Sig: Inhale 2 puffs into the lungs every 6 (six) hours as needed for wheezing or shortness of breath.    Dispense:  1 each    Refill:  0   We will see the patient in follow-up in 4 months time she is to call sooner should any new problems arise.  We will notify her of the results of her CT chest once these are available.   Renold Don, MD Advanced Bronchoscopy PCCM [facility]    *This note was dictated using voice recognition software/Dragon.  Despite best efforts to proofread, errors can occur which can change the meaning. Any transcriptional errors that result from this process are unintentional and may not be fully corrected at the time of dictation.

## 2022-11-17 ENCOUNTER — Other Ambulatory Visit: Payer: Self-pay | Admitting: Acute Care

## 2022-11-17 DIAGNOSIS — Z122 Encounter for screening for malignant neoplasm of respiratory organs: Secondary | ICD-10-CM

## 2022-11-17 DIAGNOSIS — Z87891 Personal history of nicotine dependence: Secondary | ICD-10-CM

## 2022-11-17 DIAGNOSIS — F17210 Nicotine dependence, cigarettes, uncomplicated: Secondary | ICD-10-CM

## 2022-11-30 ENCOUNTER — Encounter: Payer: Self-pay | Admitting: Pulmonary Disease

## 2022-12-09 ENCOUNTER — Ambulatory Visit: Payer: Medicare Other | Admitting: Student in an Organized Health Care Education/Training Program

## 2022-12-25 ENCOUNTER — Ambulatory Visit: Payer: Self-pay | Admitting: Student in an Organized Health Care Education/Training Program

## 2023-01-31 ENCOUNTER — Emergency Department
Admission: EM | Admit: 2023-01-31 | Discharge: 2023-01-31 | Disposition: A | Discharge status: Home / Self Care | Payer: 59 | Attending: Emergency Medicine | Admitting: Emergency Medicine

## 2023-01-31 ENCOUNTER — Other Ambulatory Visit: Payer: Self-pay

## 2023-01-31 ENCOUNTER — Emergency Department: Payer: 59

## 2023-01-31 DIAGNOSIS — S93402A Sprain of unspecified ligament of left ankle, initial encounter: Secondary | ICD-10-CM | POA: Diagnosis not present

## 2023-01-31 DIAGNOSIS — W06XXXA Fall from bed, initial encounter: Secondary | ICD-10-CM | POA: Insufficient documentation

## 2023-01-31 DIAGNOSIS — M25572 Pain in left ankle and joints of left foot: Secondary | ICD-10-CM | POA: Diagnosis present

## 2023-01-31 NOTE — Discharge Instructions (Signed)
Follow-up with your primary care provider if any continued problems or concerns.  Ice and elevation for your ankle and also wear the Ace wrap as needed for support.

## 2023-01-31 NOTE — ED Triage Notes (Signed)
Pt states she fell last night out of the bed and having left ankle pain.

## 2023-01-31 NOTE — ED Provider Notes (Signed)
[facility] Provider Note    Event Date/Time   First MD Initiated Contact with Patient 01/31/23 1307     (approximate)   History   Ankle Pain   HPI  Sally Hughes is a 62 y.o. female   reports left ankle pain after falling out of bed last night.  Patient denies any other injuries.  Patient generally walks with a cane and has not with her currently.  She also reports that she is not completely certain that she took her blood pressure medication today.  She denies any headache, dizziness, chest pain, shortness of breath.      Physical Exam   Triage Vital Signs: ED Triage Vitals  Enc Vitals Group     BP 01/31/23 1254 (!) 171/147     Pulse Rate 01/31/23 1254 (!) 112     Resp 01/31/23 1254 18     Temp 01/31/23 1254 98.2 F (36.8 C)     Temp Source 01/31/23 1254 Oral     SpO2 01/31/23 1254 93 %     Weight --      Height --      Head Circumference --      Peak Flow --      Pain Score 01/31/23 1255 4     Pain Loc --      Pain Edu? --      Excl. in [place]? --     Most recent vital signs: Vitals:   01/31/23 1254 01/31/23 1357  BP: (!) 171/147 121/81  Pulse: (!) 112   Resp: 18   Temp: 98.2 F (36.8 C)   SpO2: 93%      General: Awake, no distress.  CV:  Good peripheral perfusion.  Resp:  Normal effort.  Abd:  No distention.  Other:  Left ankle no gross deformity or soft tissue edema.  No ecchymosis and skin is intact.  Pulses are present.  Tender to palpation both lateral and medial aspect.   ED Results / Procedures / Treatments   Labs (all labs ordered are listed, but only abnormal results are displayed) Labs Reviewed - No data to display    RADIOLOGY Left ankle x-ray images were reviewed and interpreted by myself independent of the radiologist is negative for fracture or dislocation.    PROCEDURES:  Critical Care performed:   Procedures   MEDICATIONS ORDERED IN ED: Medications - No data to display   IMPRESSION / MDM  / [place] / ED COURSE  I reviewed the triage vital signs and the nursing notes.   Differential diagnosis includes, but is not limited to, left ankle sprain, contusion, fracture, dislocation.  62 year old female presents to the ED with complaint of left ankle pain after falling out of bed last night.  Patient continues to ambulate with the use of a cane.  She initially had blood pressure of 171/147 and stated that she cannot remember if she took her blood pressure medication.  Blood pressure was repeated after patient had been here for approximately an hour and blood pressure was 121/81.  She is strongly encouraged to continue taking her blood pressure medication as prescribed.  She plans on following up with her PCP on Monday.  Ace wrap was applied and patient is to ice and elevate as needed for pain, swelling.  He is also to follow-up with Dr. Earleen Newport who is on-call for podiatry if any continued problems.      Patient's presentation is most consistent with acute  complicated illness / injury requiring diagnostic workup.  FINAL CLINICAL IMPRESSION(S) / ED DIAGNOSES   Final diagnoses:  Sprain of left ankle, unspecified ligament, initial encounter     Rx / DC Orders   ED Discharge Orders     None        Note:  This document was prepared using Dragon voice recognition software and may include unintentional dictation errors.   [name], PA-C 01/31/23 1429    Lavonia Drafts, MD 01/31/23 [phone]

## 2023-02-25 ENCOUNTER — Emergency Department: Payer: 59

## 2023-02-25 ENCOUNTER — Observation Stay
Admission: EM | Admit: 2023-02-25 | Discharge: 2023-02-26 | Disposition: A | Discharge status: Home / Self Care | Payer: 59 | Attending: Internal Medicine | Admitting: Internal Medicine

## 2023-02-25 ENCOUNTER — Encounter: Payer: Self-pay | Admitting: Emergency Medicine

## 2023-02-25 ENCOUNTER — Other Ambulatory Visit: Payer: Self-pay

## 2023-02-25 DIAGNOSIS — G8929 Other chronic pain: Secondary | ICD-10-CM | POA: Insufficient documentation

## 2023-02-25 DIAGNOSIS — J45909 Unspecified asthma, uncomplicated: Secondary | ICD-10-CM | POA: Insufficient documentation

## 2023-02-25 DIAGNOSIS — G2581 Restless legs syndrome: Secondary | ICD-10-CM | POA: Diagnosis not present

## 2023-02-25 DIAGNOSIS — Z7984 Long term (current) use of oral hypoglycemic drugs: Secondary | ICD-10-CM | POA: Diagnosis not present

## 2023-02-25 DIAGNOSIS — F1290 Cannabis use, unspecified, uncomplicated: Secondary | ICD-10-CM | POA: Diagnosis not present

## 2023-02-25 DIAGNOSIS — G929 Unspecified toxic encephalopathy: Secondary | ICD-10-CM

## 2023-02-25 DIAGNOSIS — Z79899 Other long term (current) drug therapy: Secondary | ICD-10-CM

## 2023-02-25 DIAGNOSIS — I10 Essential (primary) hypertension: Secondary | ICD-10-CM | POA: Diagnosis not present

## 2023-02-25 DIAGNOSIS — G43719 Chronic migraine without aura, intractable, without status migrainosus: Secondary | ICD-10-CM | POA: Diagnosis not present

## 2023-02-25 DIAGNOSIS — R4182 Altered mental status, unspecified: Secondary | ICD-10-CM | POA: Diagnosis not present

## 2023-02-25 DIAGNOSIS — F17210 Nicotine dependence, cigarettes, uncomplicated: Secondary | ICD-10-CM | POA: Diagnosis not present

## 2023-02-25 DIAGNOSIS — T50901A Poisoning by unspecified drugs, medicaments and biological substances, accidental (unintentional), initial encounter: Secondary | ICD-10-CM | POA: Diagnosis not present

## 2023-02-25 DIAGNOSIS — E119 Type 2 diabetes mellitus without complications: Secondary | ICD-10-CM | POA: Insufficient documentation

## 2023-02-25 DIAGNOSIS — F1190 Opioid use, unspecified, uncomplicated: Secondary | ICD-10-CM | POA: Diagnosis present

## 2023-02-25 DIAGNOSIS — J449 Chronic obstructive pulmonary disease, unspecified: Secondary | ICD-10-CM | POA: Insufficient documentation

## 2023-02-25 DIAGNOSIS — T50904A Poisoning by unspecified drugs, medicaments and biological substances, undetermined, initial encounter: Principal | ICD-10-CM

## 2023-02-25 DIAGNOSIS — F411 Generalized anxiety disorder: Secondary | ICD-10-CM | POA: Diagnosis present

## 2023-02-25 DIAGNOSIS — E118 Type 2 diabetes mellitus with unspecified complications: Secondary | ICD-10-CM

## 2023-02-25 DIAGNOSIS — T50901S Poisoning by unspecified drugs, medicaments and biological substances, accidental (unintentional), sequela: Secondary | ICD-10-CM | POA: Diagnosis not present

## 2023-02-25 DIAGNOSIS — G43709 Chronic migraine without aura, not intractable, without status migrainosus: Secondary | ICD-10-CM | POA: Diagnosis present

## 2023-02-25 LAB — URINE DRUG SCREEN, QUALITATIVE (ARMC ONLY)
Amphetamines, Ur Screen: NOT DETECTED
Barbiturates, Ur Screen: NOT DETECTED
Benzodiazepine, Ur Scrn: NOT DETECTED
Cannabinoid 50 Ng, Ur ~~LOC~~: POSITIVE — AB
Cocaine Metabolite,Ur ~~LOC~~: NOT DETECTED
MDMA (Ecstasy)Ur Screen: NOT DETECTED
Methadone Scn, Ur: NOT DETECTED
Opiate, Ur Screen: POSITIVE — AB
Phencyclidine (PCP) Ur S: NOT DETECTED
Tricyclic, Ur Screen: POSITIVE — AB

## 2023-02-25 LAB — URINALYSIS, ROUTINE W REFLEX MICROSCOPIC
Bacteria, UA: NONE SEEN
Bilirubin Urine: NEGATIVE
Glucose, UA: NEGATIVE mg/dL
Hgb urine dipstick: NEGATIVE
Ketones, ur: 5 mg/dL — AB
Leukocytes,Ua: NEGATIVE
Nitrite: NEGATIVE
Protein, ur: 30 mg/dL — AB
Specific Gravity, Urine: 1.031 — ABNORMAL HIGH (ref 1.005–1.030)
pH: 5 (ref 5.0–8.0)

## 2023-02-25 LAB — CBC
HCT: 39.2 % (ref 36.0–46.0)
Hemoglobin: 12.4 g/dL (ref 12.0–15.0)
MCH: 30.7 pg (ref 26.0–34.0)
MCHC: 31.6 g/dL (ref 30.0–36.0)
MCV: 97 fL (ref 80.0–100.0)
Platelets: 224 10*3/uL (ref 150–400)
RBC: 4.04 MIL/uL (ref 3.87–5.11)
RDW: 12.9 % (ref 11.5–15.5)
WBC: 9.1 10*3/uL (ref 4.0–10.5)
nRBC: 0 % (ref 0.0–0.2)

## 2023-02-25 LAB — SALICYLATE LEVEL: Salicylate Lvl: <7 mg/dL — ABNORMAL LOW (ref 7.0–30.0)

## 2023-02-25 LAB — BLOOD GAS, ARTERIAL
Acid-Base Excess: 2.2 mmol/L — ABNORMAL HIGH (ref 0.0–2.0)
Bicarbonate: 29.1 mmol/L — ABNORMAL HIGH (ref 20.0–28.0)
O2 Content: 3 L/min
O2 Saturation: 97.3 %
Patient temperature: 37
pCO2 arterial: 54 mmHg — ABNORMAL HIGH (ref 32–48)
pH, Arterial: 7.34 — ABNORMAL LOW (ref 7.35–7.45)
pO2, Arterial: 96 mmHg (ref 83–108)

## 2023-02-25 LAB — COMPREHENSIVE METABOLIC PANEL
ALT: 17 U/L (ref 0–44)
AST: 26 U/L (ref 15–41)
Albumin: 3.9 g/dL (ref 3.5–5.0)
Alkaline Phosphatase: 59 U/L (ref 38–126)
Anion gap: 7 (ref 5–15)
BUN: 18 mg/dL (ref 8–23)
CO2: 30 mmol/L (ref 22–32)
Calcium: 9.1 mg/dL (ref 8.9–10.3)
Chloride: 105 mmol/L (ref 98–111)
Creatinine, Ser: 1.03 mg/dL — ABNORMAL HIGH (ref 0.44–1.00)
GFR, Estimated: >60 mL/min (ref >60)
Glucose, Bld: 129 mg/dL — ABNORMAL HIGH (ref 70–99)
Potassium: 4.1 mmol/L (ref 3.5–5.1)
Sodium: 142 mmol/L (ref 135–145)
Total Bilirubin: 0.5 mg/dL (ref 0.3–1.2)
Total Protein: 7.5 g/dL (ref 6.5–8.1)

## 2023-02-25 LAB — HEMOGLOBIN A1C
Hgb A1c MFr Bld: 6.5 % — ABNORMAL HIGH (ref 4.8–5.6)
Mean Plasma Glucose: 139.85 mg/dL

## 2023-02-25 LAB — CBG MONITORING, ED
Glucose-Capillary: 103 mg/dL — ABNORMAL HIGH (ref 70–99)
Glucose-Capillary: 101 mg/dL — ABNORMAL HIGH (ref 70–99)
Glucose-Capillary: 82 mg/dL (ref 70–99)
Glucose-Capillary: 109 mg/dL — ABNORMAL HIGH (ref 70–99)

## 2023-02-25 LAB — TROPONIN I (HIGH SENSITIVITY)
Troponin I (High Sensitivity): 3 ng/L (ref <18)
Troponin I (High Sensitivity): <2 ng/L (ref <18)

## 2023-02-25 LAB — GLUCOSE, CAPILLARY
Glucose-Capillary: 107 mg/dL — ABNORMAL HIGH (ref 70–99)
Glucose-Capillary: 118 mg/dL — ABNORMAL HIGH (ref 70–99)

## 2023-02-25 LAB — ETHANOL: Alcohol, Ethyl (B): <10 mg/dL (ref <10)

## 2023-02-25 LAB — ACETAMINOPHEN LEVEL: Acetaminophen (Tylenol), Serum: <10 ug/mL — ABNORMAL LOW (ref 10–30)

## 2023-02-25 LAB — HIV ANTIBODY (ROUTINE TESTING W REFLEX): HIV Screen 4th Generation wRfx: NONREACTIVE

## 2023-02-25 LAB — VALPROIC ACID LEVEL: Valproic Acid Lvl: 37 ug/mL — ABNORMAL LOW (ref 50.0–100.0)

## 2023-02-25 MED ORDER — SODIUM CHLORIDE 0.9% FLUSH
3.0000 mL | Freq: Two times a day (BID) | INTRAVENOUS | Status: DC
  Administered 2023-02-25 – 2023-02-26 (×2): 3 mL via INTRAVENOUS

## 2023-02-25 MED ORDER — PANTOPRAZOLE SODIUM 40 MG PO TBEC
40.0000 mg | DELAYED_RELEASE_TABLET | Freq: Every day | ORAL | Status: DC
  Administered 2023-02-25 – 2023-02-26 (×2): 40 mg via ORAL
  Filled 2023-02-25 (×2): qty 1

## 2023-02-25 MED ORDER — VENLAFAXINE HCL ER 37.5 MG PO CP24
37.5000 mg | ORAL_CAPSULE | Freq: Every day | ORAL | Status: DC
  Administered 2023-02-26: 37.5 mg via ORAL
  Filled 2023-02-25: qty 1

## 2023-02-25 MED ORDER — ROPINIROLE HCL 1 MG PO TABS
0.5000 mg | ORAL_TABLET | Freq: Every day | ORAL | Status: DC
  Administered 2023-02-25: 0.5 mg via ORAL
  Filled 2023-02-25: qty 2
  Filled 2023-02-25: qty 1

## 2023-02-25 MED ORDER — AMITRIPTYLINE HCL 100 MG PO TABS
100.0000 mg | ORAL_TABLET | Freq: Every day | ORAL | Status: DC
  Administered 2023-02-25: 100 mg via ORAL
  Filled 2023-02-25: qty 1

## 2023-02-25 MED ORDER — NALOXONE HCL 2 MG/2ML IJ SOSY
0.4000 mg | PREFILLED_SYRINGE | Freq: Once | INTRAMUSCULAR | Status: AC
  Administered 2023-02-25: 0.4 mg via INTRAVENOUS
  Filled 2023-02-25: qty 2

## 2023-02-25 MED ORDER — SODIUM CHLORIDE 0.9 % IV BOLUS
1000.0000 mL | Freq: Once | INTRAVENOUS | Status: AC
  Administered 2023-02-25: 1000 mL via INTRAVENOUS

## 2023-02-25 MED ORDER — SODIUM CHLORIDE 0.9 % IV SOLN: INTRAVENOUS | Status: DC

## 2023-02-25 MED ORDER — PRAVASTATIN SODIUM 40 MG PO TABS
40.0000 mg | ORAL_TABLET | Freq: Every day | ORAL | Status: DC
  Administered 2023-02-25: 40 mg via ORAL
  Filled 2023-02-25: qty 2

## 2023-02-25 MED ORDER — NICOTINE 14 MG/24HR TD PT24
14.0000 mg | MEDICATED_PATCH | Freq: Every day | TRANSDERMAL | Status: DC
  Administered 2023-02-25 – 2023-02-26 (×2): 14 mg via TRANSDERMAL
  Filled 2023-02-25 (×2): qty 1

## 2023-02-25 MED ORDER — ONDANSETRON HCL 4 MG/2ML IJ SOLN: 4.0000 mg | Freq: Four times a day (QID) | INTRAMUSCULAR | Status: DC | PRN

## 2023-02-25 MED ORDER — ONDANSETRON HCL 4 MG PO TABS: 4.0000 mg | ORAL_TABLET | Freq: Four times a day (QID) | ORAL | Status: DC | PRN

## 2023-02-25 MED ORDER — DIVALPROEX SODIUM 250 MG PO DR TAB
250.0000 mg | DELAYED_RELEASE_TABLET | Freq: Two times a day (BID) | ORAL | Status: DC
  Administered 2023-02-25 – 2023-02-26 (×3): 250 mg via ORAL
  Filled 2023-02-25 (×4): qty 1

## 2023-02-25 MED ORDER — ENOXAPARIN SODIUM 80 MG/0.8ML IJ SOSY
0.5000 mg/kg | PREFILLED_SYRINGE | INTRAMUSCULAR | Status: DC
  Administered 2023-02-25 – 2023-02-26 (×2): 65 mg via SUBCUTANEOUS
  Filled 2023-02-25: qty 0.8
  Filled 2023-02-25: qty 0.65

## 2023-02-25 MED ORDER — INSULIN ASPART 100 UNIT/ML IJ SOLN: 0.0000 [IU] | INTRAMUSCULAR | Status: DC

## 2023-02-25 NOTE — H&P (Signed)
History and Physical    Patient: Sally Hughes UJW:[id number] TANICIA [id number] DOA: 02/25/2023 DOS: the patient was seen and examined on 02/25/2023 PCP: Enid Baas, MD  Patient coming from: Home  Chief Complaint:  Chief Complaint  Patient presents with   Drug Overdose    HPI: Sally Hughes is a 62 y.o. female with medical history significant for COPD, diabetes, chronic low back pain with sciatica, chronic headaches, depression, anxiety with recent mood changes, on titrating doses of Effexor, recently seen by her PCP on 02/12/2023, who was brought to the ED by EMS after she was found on the floor with slurred speech.  She was noted to be very somnolent, reporting that she had some medication changes.  She was not able to contribute meaningfully to history due to falling asleep when awakened.  On reviewing records patient got a prescription for hydroxyzine 50 mg and Flexeril 10 mg on 02/17/2023.  Depakote was also added to her regimen.  Additionally she appears to be on amitriptyline, meclizine, ropinirole and divalproex as well as Effexor.this is the other overdose. I looked at a recent pulmonary note and she is not on oxygen at baseline. labs, CT, cxr negative. No response to IV Narcan.  In the ED, they found flexeril, depakote, hydroxyzine in her purse.  ED course and data review: Vitals within normal limits.  ABG with pH 7.34, pCO2 54 and pO2 96.  EtOH less than 10.  CBC unremarkable, CMP unremarkable.  Acetaminophen and salicylate levels pending.  Troponin 3.Patient changes. G, personally viewed and interpreted showing sinus rhythm at 90 with no concerning ST-T wave changes. CT head nonacute Chest x-ray nonacute  Patient had no response to Narcan in the emergency room.  She remained very somnolent. Patient was given an IV fluid bolus Hospitalist consulted for admission.     Past Medical History:  Diagnosis Date   Arthritis    whole body   Asthma    inhaler in summer    Back pain    Bell's palsy    12 yrs ago, right eye weaker   COPD (chronic obstructive pulmonary disease)    wheezing   Depression    Diabetes mellitus, type 2    Dyspnea    Family history of adverse reaction to anesthesia    mother would be irritable when waking up.   GERD (gastroesophageal reflux disease)    Headache    about everyday   Hepatitis C    resolved with medication   Hypertension    Kidney tumor (benign), left    Pneumonia    in past   Sleep apnea    needs a CPAP getting on soon   Snake bite    as a child   Past Surgical History:  Procedure Laterality Date   CARPOMETACARPAL (CMC) FUSION OF THUMB Left 11/27/2021   Procedure: LEFT THUMB CMC JOINT ARTHRTOPLASTY;  Surgeon: Christena Flake, MD;  Location: [facility];  Service: Orthopedics;  Laterality: Left;   CATARACT EXTRACTION W/PHACO Left 04/19/2018   Procedure: CATARACT EXTRACTION PHACO AND INTRAOCULAR LENS PLACEMENT (IOC) LEFT IVA TOPICAL;  Surgeon: Lockie Mola, MD;  Location: [facility];  Service: Ophthalmology;  Laterality: Left;   CATARACT EXTRACTION W/PHACO Right 05/19/2018   Procedure: CATARACT EXTRACTION PHACO AND INTRAOCULAR LENS PLACEMENT (IOC)  RIGHT;  Surgeon: Lockie Mola, MD;  Location: [facility];  Service: Ophthalmology;  Laterality: Right;   GANGLION CYST EXCISION Right 10/28/2022   Procedure: REMOVAL GANGLION OF WRIST;  Surgeon: Kennedy Bucker, MD;  Location: [facility];  Service: Orthopedics;  Laterality: Right;   MICROLARYNGOSCOPY Right 01/13/2020   Procedure: MICROLARYNGOSCOPY WITH EXCISION OF VC LESION;  Surgeon: Linus Salmons, MD;  Location: [facility];  Service: ENT;  Laterality: Right;  Diabetic - oral meds   TUBAL LIGATION     TUMOR REMOVAL     benign tumor removed left kidney   XI ROBOTIC ASSISTED VENTRAL HERNIA N/A 06/13/2020   Procedure: XI ROBOTIC ASSISTED INCISIONAL HERNIA;  Surgeon: Carolan Shiver, MD;  Location: [facility];  Service:  General;  Laterality: N/A;   Social History:  reports that she has been smoking cigarettes. She has a 90.00 pack-year smoking history. She has never used smokeless tobacco. She reports that she does not currently use drugs after having used the following drugs: Marijuana. She reports that she does not drink alcohol.  Allergies  Allergen Reactions   Buspirone     confused   Gabapentin Hives and Other (See Comments)    Involuntary jerking and hives   Pregabalin Other (See Comments)    Mental changes    Family History  Problem Relation Age of Onset   Drug abuse Son     Prior to Admission medications   Medication Sig Start Date End Date Taking? Authorizing Provider  albuterol (VENTOLIN HFA) 108 (90 Base) MCG/ACT inhaler Inhale 2 puffs into the lungs every 6 (six) hours as needed for wheezing or shortness of breath. 11/13/22   Salena Saner, MD  amitriptyline (ELAVIL) 100 MG tablet Take 100 mg by mouth at bedtime. 09/04/20   [provider]  Blood Glucose Monitoring Suppl (TRUE METRIX METER) DEVI SMARTSIG:1 Unit(s) Via Meter Daily 05/22/20   [provider]  celecoxib (CELEBREX) 200 MG capsule Take 1 capsule (200 mg total) by mouth daily. 11/27/21   Poggi, Excell Seltzer, MD  Cyanocobalamin (VITAMIN B12 PO) Take 1 tablet by mouth daily.     [provider]  cyclobenzaprine (FLEXERIL) 10 MG tablet Take 1 tablet by mouth 2 (two) times daily. 03/11/22   [provider]  hydrOXYzine (VISTARIL) 50 MG capsule Take 1 capsule (50 mg total) by mouth at bedtime. Patient not taking: Reported on 11/13/2022 11/27/21   Poggi, Excell Seltzer, MD  meclizine (ANTIVERT) 25 MG tablet Take 25 mg by mouth 3 (three) times daily as needed for dizziness or nausea.  04/26/20   [provider]  metFORMIN (GLUCOPHAGE-XR) 500 MG 24 hr tablet Take 250-500 mg by mouth See admin instructions. Take 500 mg by mouth daily in the morning and may take an additional 250 mg if needed for high blood sugar  05/30/20   [provider]  metoprolol tartrate (LOPRESSOR) 25 MG tablet Take 25 mg by mouth in the morning and at bedtime. 03/20/20   [provider]  mometasone-formoterol (DULERA) 100-5 MCG/ACT AERO Inhale into the lungs. 08/29/20 04/10/22  [provider]  Omega-3 Fatty Acids (FISH OIL PO) Take 1 capsule by mouth daily.    [provider]  omeprazole (PRILOSEC) 20 MG capsule Take 20 mg by mouth in the morning. 01/10/20   [provider]  Pharmacist Choice Lancets MISC Apply topically as needed.  01/17/20   [provider]  pravastatin (PRAVACHOL) 40 MG tablet Take 40 mg by mouth at bedtime.    [provider]  predniSONE (DELTASONE) 20 MG tablet Take 2 tablets by mouth daily for 4 days followed by 1 tablet by mouth daily for 4 days  and stop 12 11/06/22   [provider]  rOPINIRole (REQUIP) 0.5 MG tablet TAKE (1) TABLET BY MOUTH DAILY AT BEDTIME FOR RESTLESS LEGS. 10/01/20   Jomarie Longs, MD  TRUE METRIX BLOOD GLUCOSE TEST test strip SMARTSIG:1 Via Meter 4-5 Times Daily 06/26/20   [provider]  venlafaxine XR (EFFEXOR-XR) 75 MG 24 hr capsule Take 75 mg by mouth in the morning and at bedtime.    [provider]  VITAMIN D PO Take 1 tablet by mouth daily.     [provider]  vitamin E 180 MG (400 UNITS) capsule Take 400 Units by mouth daily.    [provider]    Physical Exam: Vitals:   02/25/23 0227 02/25/23 0232 02/25/23 0330  BP:  110/75 119/71  Pulse:  94 81  Resp:   13  Temp:  97.9 F (36.6 C)   TempSrc:  Oral   SpO2:  91% 99%  Weight: 131.5 kg    Height: 5\' 3"  (1.6 m)     Physical Exam Vitals and nursing note reviewed.  Constitutional:      General: She is sleeping. She is not in acute distress.    Appearance: She is obese.     Comments: Hard to arouse  HENT:     Head: Normocephalic and atraumatic.  Cardiovascular:     Rate and Rhythm: Normal rate and regular rhythm.      Heart sounds: Normal heart sounds.  Pulmonary:     Effort: Pulmonary effort is normal.     Breath sounds: Normal breath sounds.  Abdominal:     Palpations: Abdomen is soft.     Tenderness: There is no abdominal tenderness.     Labs on Admission: I have personally reviewed following labs and imaging studies  CBC: Recent Labs  Lab 02/25/23 0231  WBC 9.1  HGB 12.4  HCT 39.2  MCV 97.0  PLT 224   Basic Metabolic Panel: Recent Labs  Lab 02/25/23 0231  NA 142  K 4.1  CL 105  CO2 30  GLUCOSE 129*  BUN 18  CREATININE 1.03*  CALCIUM 9.1   GFR: Estimated Creatinine Clearance: 76.1 mL/min (A) (by C-G formula based on SCr of 1.03 mg/dL (H)). Liver Function Tests: Recent Labs  Lab 02/25/23 0231  AST 26  ALT 17  ALKPHOS 59  BILITOT 0.5  PROT 7.5  ALBUMIN 3.9   No results for input(s): "LIPASE", "AMYLASE" in the last 168 hours. No results for input(s): "AMMONIA" in the last 168 hours. Coagulation Profile: No results for input(s): "INR", "PROTIME" in the last 168 hours. Cardiac Enzymes: No results for input(s): "CKTOTAL", "CKMB", "CKMBINDEX", "TROPONINI" in the last 168 hours. BNP (last 3 results) No results for input(s): "PROBNP" in the last 8760 hours. HbA1C: No results for input(s): "HGBA1C" in the last 72 hours. CBG: No results for input(s): "GLUCAP" in the last 168 hours. Lipid Profile: No results for input(s): "CHOL", "HDL", "LDLCALC", "TRIG", "CHOLHDL", "LDLDIRECT" in the last 72 hours. Thyroid Function Tests: No results for input(s): "TSH", "T4TOTAL", "FREET4", "T3FREE", "THYROIDAB" in the last 72 hours. Anemia Panel: No results for input(s): "VITAMINB12", "FOLATE", "FERRITIN", "TIBC", "IRON", "RETICCTPCT" in the last 72 hours. Urine analysis:    Component Value Date/Time   COLORURINE YELLOW (A) 02/25/2023 0318   APPEARANCEUR HAZY (A) 02/25/2023 0318   LABSPEC 1.031 (H) 02/25/2023 0318   PHURINE 5.0 02/25/2023 0318   GLUCOSEU NEGATIVE 02/25/2023  0318   HGBUR NEGATIVE 02/25/2023 0318   BILIRUBINUR  NEGATIVE 02/25/2023 0318   KETONESUR 5 (A) 02/25/2023 0318   PROTEINUR 30 (A) 02/25/2023 0318   NITRITE NEGATIVE 02/25/2023 0318   LEUKOCYTESUR NEGATIVE 02/25/2023 0318    Radiological Exams on Admission: CT Head Wo Contrast  Result Date: 02/25/2023 CLINICAL DATA:  Patient was found on floor. Slurred speech. Altered mental status. EXAM: CT HEAD WITHOUT CONTRAST TECHNIQUE: Contiguous axial images were obtained from the base of the skull through the vertex without intravenous contrast. RADIATION DOSE REDUCTION: This exam was performed according to the departmental dose-optimization program which includes automated exposure control, adjustment of the mA and/or kV according to patient size and/or use of iterative reconstruction technique. COMPARISON:  CT head 12/20/2021 FINDINGS: Brain: No intracranial hemorrhage, mass effect, or evidence of acute infarct. No hydrocephalus. No extra-axial fluid collection. Vascular: No hyperdense vessel or unexpected calcification. Skull: No fracture or focal lesion. Sinuses/Orbits: No acute finding. Paranasal sinuses and mastoid air cells are well aerated. Other: None. IMPRESSION: No acute intracranial process. Electronically Signed   By: Minerva Fester M.D.   On: 02/25/2023 03:07   DG Chest Port 1 View  Result Date: 02/25/2023 CLINICAL DATA:  Overdose EXAM: PORTABLE CHEST 1 VIEW COMPARISON:  CT chest dated 11/13/2022 FINDINGS: Mild left upper lobe scarring. Right lung is clear. No pleural effusion or pneumothorax. The heart is top-normal in size.  Thoracic aortic atherosclerosis. IMPRESSION: No acute cardiopulmonary disease. Electronically Signed   By: Charline Bills M.D.   On: 02/25/2023 03:03     Data Reviewed: Relevant notes from primary care and specialist visits, past discharge summaries as available in EHR, including Care Everywhere. Prior diagnostic testing as pertinent to current admission  diagnoses Updated medications and problem lists for reconciliation ED course, including vitals, labs, imaging, treatment and response to treatment Triage notes, nursing and pharmacy notes and ED provider's notes Notable results as noted in HPI   Assessment and Plan: * Toxic encephalopathy Polypharmacy Unintentional overdose Patient with increase in somnolence, suspected unintentional overdose Neurologic checks with fall and aspiration precautions Telemetry Hold sedating meds including hydroxyzine, Flexeril, amitriptyline, meclizine as well as ropinirole and Depakote Follow-up Depakote level  Chronic obstructive pulmonary disease (COPD) Not acutely exacerbated DuoNeb as needed  Chronic back pain Chronic migraine Avoid sedating meds.  Hold Flexeril and amitriptyline for now until more awake Toradol as needed  RLS (restless legs syndrome) Hold ropinirole tonight  GAD (generalized anxiety disorder) Can continue Effexor        DVT prophylaxis: Lovenox  Consults: none  Advance Care Planning:   Code Status: Prior   Family Communication: none  Disposition Plan: Back to previous home environment  Severity of Illness: The appropriate patient status for this patient is INPATIENT. Inpatient status is judged to be reasonable and necessary in order to provide the required intensity of service to ensure the patient's safety. The patient's presenting symptoms, physical exam findings, and initial radiographic and laboratory data in the context of their chronic comorbidities is felt to place them at high risk for further clinical deterioration. Furthermore, it is not anticipated that the patient will be medically stable for discharge from the hospital within 2 midnights of admission.   * I certify that at the point of admission it is my clinical judgment that the patient will require inpatient hospital care spanning beyond 2 midnights from the point of admission due to high intensity  of service, high risk for further deterioration and high frequency of surveillance required.*  Author: Andris Baumann, MD 02/25/2023 4:18  AM  For on call review [email or web address].

## 2023-02-25 NOTE — ED Notes (Signed)
ED TO INPATIENT HANDOFF REPORT  ED Nurse Name and Phone #: Virgilio Belling Name/Age/Gender Sally Hughes 62 y.o. female Room/Bed: ED03A/ED03A  Code Status   Code Status: Full Code  Home/SNF/Other Home Patient oriented to: self, place, time, and situation Is this baseline? Yes   Triage Complete: Triage complete  Chief Complaint Toxic encephalopathy [G92.9]  Triage Note EMS brings pt in from home; received a call from SO regarding pt was found on the floor ; pt with slurred speech, falling asleep in triage; SO reports pt had some "medication changes"   Allergies Allergies  Allergen Reactions   Buspirone     confused   Gabapentin Hives and Other (See Comments)    Involuntary jerking and hives   Pregabalin Other (See Comments)    Mental changes    Level of Care/Admitting Diagnosis ED Disposition     ED Disposition  Admit   Condition  --   Comment  Hospital Area: [facility] [[id number]]  Level of Care: Progressive [102]  Admit to Progressive based on following criteria: ACUTE MENTAL DISORDER-RELATED Drug/Alcohol Ingestion/Overdose/Withdrawal, Suicidal Ideation/attempt requiring safety sitter and < Q2h monitoring/assessments, moderate to severe agitation that is managed with medication/sitter, CIWA-Ar score < 20.  Covid Evaluation: Asymptomatic - no recent exposure (last 10 days) testing not required  Diagnosis: Toxic encephalopathy [349.82.ICD-9-CM]  Admitting Physician: Andris Baumann [[id number]]  Attending Physician: Andris Baumann [[id number]]  Certification:: I certify this patient will need inpatient services for at least 2 midnights  Estimated Length of Stay: 3          B Medical/Surgery History Past Medical History:  Diagnosis Date   Arthritis    whole body   Asthma    inhaler in summer   Back pain    Bell's palsy    12 yrs ago, right eye weaker   COPD (chronic obstructive pulmonary disease)    wheezing   Depression     Diabetes mellitus, type 2    Dyspnea    Family history of adverse reaction to anesthesia    mother would be irritable when waking up.   GERD (gastroesophageal reflux disease)    Headache    about everyday   Hepatitis C    resolved with medication   Hypertension    Kidney tumor (benign), left    Pneumonia    in past   Sleep apnea    needs a CPAP getting on soon   Snake bite    as a child   Past Surgical History:  Procedure Laterality Date   CARPOMETACARPAL (CMC) FUSION OF THUMB Left 11/27/2021   Procedure: LEFT THUMB CMC JOINT ARTHRTOPLASTY;  Surgeon: Christena Flake, MD;  Location: [facility];  Service: Orthopedics;  Laterality: Left;   CATARACT EXTRACTION W/PHACO Left 04/19/2018   Procedure: CATARACT EXTRACTION PHACO AND INTRAOCULAR LENS PLACEMENT (IOC) LEFT IVA TOPICAL;  Surgeon: Lockie Mola, MD;  Location: [facility];  Service: Ophthalmology;  Laterality: Left;   CATARACT EXTRACTION W/PHACO Right 05/19/2018   Procedure: CATARACT EXTRACTION PHACO AND INTRAOCULAR LENS PLACEMENT (IOC)  RIGHT;  Surgeon: Lockie Mola, MD;  Location: [facility];  Service: Ophthalmology;  Laterality: Right;   GANGLION CYST EXCISION Right 10/28/2022   Procedure: REMOVAL GANGLION OF WRIST;  Surgeon: Kennedy Bucker, MD;  Location: [facility];  Service: Orthopedics;  Laterality: Right;   MICROLARYNGOSCOPY Right 01/13/2020   Procedure: MICROLARYNGOSCOPY WITH EXCISION OF VC LESION;  Surgeon: Linus Salmons, MD;  Location:  [facility];  Service: ENT;  Laterality: Right;  Diabetic - oral meds   TUBAL LIGATION     TUMOR REMOVAL     benign tumor removed left kidney   XI ROBOTIC ASSISTED VENTRAL HERNIA N/A 06/13/2020   Procedure: XI ROBOTIC ASSISTED INCISIONAL HERNIA;  Surgeon: Carolan Shiver, MD;  Location: [facility];  Service: General;  Laterality: N/A;     A IV Location/Drains/Wounds Patient Lines/Drains/Airways Status     Active Line/Drains/Airways      Name Placement date Placement time Site Days   Peripheral IV 02/25/23 18 G Right Antecubital 02/25/23  0227  Antecubital  less than 1   External Urinary Catheter 09/21/20  1213  --  887   Incision - 3 Ports Abdomen Left;Lower;Lateral Lower;Mid Right;Lower;Lateral 06/13/20  0900  -- 987            Intake/Output Last 24 hours No intake or output data in the 24 hours ending 02/25/23 1819  Labs/Imaging Results for orders placed or performed during the hospital encounter of 02/25/23 (from the past 48 hour(s))  CBC     Status: None   Collection Time: 02/25/23  2:31 AM  Result Value Ref Range   WBC 9.1 4.0 - 10.5 K/uL   RBC 4.04 3.87 - 5.11 MIL/uL   Hemoglobin 12.4 12.0 - 15.0 g/dL   HCT 40.9 81.1 - 91.4 %   MCV 97.0 80.0 - 100.0 fL   MCH 30.7 26.0 - 34.0 pg   MCHC 31.6 30.0 - 36.0 g/dL   RDW 78.2 95.6 - 21.3 %   Platelets 224 150 - 400 K/uL   nRBC 0.0 0.0 - 0.2 %    Comment: Performed at [facility], [address] [place], [place] [address]  Comprehensive metabolic panel     Status: Abnormal   Collection Time: 02/25/23  2:31 AM  Result Value Ref Range   Sodium 142 135 - 145 mmol/L   Potassium 4.1 3.5 - 5.1 mmol/L   Chloride 105 98 - 111 mmol/L   CO2 30 22 - 32 mmol/L   Glucose, Bld 129 (H) 70 - 99 mg/dL    Comment: Glucose reference range applies only to samples taken after fasting for at least 8 hours.   BUN 18 8 - 23 mg/dL   Creatinine, Ser 8.46 (H) 0.44 - 1.00 mg/dL   Calcium 9.1 8.9 - 96.2 mg/dL   Total Protein 7.5 6.5 - 8.1 g/dL   Albumin 3.9 3.5 - 5.0 g/dL   AST 26 15 - 41 U/L   ALT 17 0 - 44 U/L   Alkaline Phosphatase 59 38 - 126 U/L   Total Bilirubin 0.5 0.3 - 1.2 mg/dL   GFR, Estimated >95 >28 mL/min    Comment: (NOTE) Calculated using the CKD-EPI Creatinine Equation (2021)    Anion gap 7 5 - 15    Comment: Performed at [facility], [address], [place], [place] [address]  Acetaminophen level     Status: Abnormal   Collection Time:  02/25/23  2:31 AM  Result Value Ref Range   Acetaminophen (Tylenol), Serum <10 (L) 10 - 30 ug/mL    Comment: (NOTE) Therapeutic concentrations vary significantly. A range of 10-30 ug/mL  may be an effective concentration for many patients. However, some  are best treated at concentrations outside of this range. Acetaminophen concentrations >150 ug/mL at 4 hours after ingestion  and >50 ug/mL at 12 hours after ingestion are often associated with  toxic  reactions.  Performed at [facility], [address] [place], [place] [address]   Ethanol     Status: None   Collection Time: 02/25/23  2:31 AM  Result Value Ref Range   Alcohol, Ethyl (B) <10 <10 mg/dL    Comment: (NOTE) Lowest detectable limit for serum alcohol is 10 mg/dL.  For medical purposes only. Performed at [facility], [address] [place], [place] [address]   Salicylate level     Status: Abnormal   Collection Time: 02/25/23  2:31 AM  Result Value Ref Range   Salicylate Lvl <7.0 (L) 7.0 - 30.0 mg/dL    Comment: Performed at [facility], [address] [place], [place] [address]  Troponin I (High Sensitivity)     Status: None   Collection Time: 02/25/23  2:31 AM  Result Value Ref Range   Troponin I (High Sensitivity) 3 <18 ng/L    Comment: (NOTE) Elevated high sensitivity troponin I (hsTnI) values and significant  changes across serial measurements may suggest ACS but many other  chronic and acute conditions are known to elevate hsTnI results.  Refer to the "Links" section for chest pain algorithms and additional  guidance. Performed at [facility], [address] [place], [place] [address]   Valproic acid level     Status: Abnormal   Collection Time: 02/25/23  2:31 AM  Result Value Ref Range   Valproic Acid Lvl 37 (L) 50.0 - 100.0 ug/mL    Comment: Performed at [facility], [address] [place], [place] [address]  Hemoglobin A1c     Status: Abnormal    Collection Time: 02/25/23  2:31 AM  Result Value Ref Range   Hgb A1c MFr Bld 6.5 (H) 4.8 - 5.6 %    Comment: (NOTE) Pre diabetes:          5.7%-6.4%  Diabetes:              >6.4%  Glycemic control for   <7.0% adults with diabetes    Mean Plasma Glucose 139.85 mg/dL    Comment: Performed at [facility], [address] [place], [place] [address]  Blood gas, arterial     Status: Abnormal   Collection Time: 02/25/23  3:15 AM  Result Value Ref Range   O2 Content 3.0 L/min   Delivery systems NASAL CANNULA    pH, Arterial 7.34 (L) 7.35 - 7.45   pCO2 arterial 54 (H) 32 - 48 mmHg   pO2, Arterial 96 83 - 108 mmHg   Bicarbonate 29.1 (H) 20.0 - 28.0 mmol/L   Acid-Base Excess 2.2 (H) 0.0 - 2.0 mmol/L   O2 Saturation 97.3 %   Patient temperature 37.0    Collection site LEFT RADIAL    Allens test (pass/fail) PASS PASS    Comment: Performed at [facility], [address] [place], [place] [address]  Urine Drug Screen, Qualitative     Status: Abnormal   Collection Time: 02/25/23  3:18 AM  Result Value Ref Range   Tricyclic, Ur Screen POSITIVE (A) NONE DETECTED   Amphetamines, Ur Screen NONE DETECTED NONE DETECTED   MDMA (Ecstasy)Ur Screen NONE DETECTED NONE DETECTED   Cocaine Metabolite,Ur [place] NONE DETECTED NONE DETECTED   Opiate, Ur Screen POSITIVE (A) NONE DETECTED   Phencyclidine (PCP) Ur S NONE DETECTED NONE DETECTED   Cannabinoid 50 Ng, Ur [place] POSITIVE (A) NONE DETECTED   Barbiturates, Ur Screen NONE DETECTED NONE DETECTED   Benzodiazepine,  Ur Scrn NONE DETECTED NONE DETECTED   Methadone Scn, Ur NONE DETECTED NONE DETECTED    Comment: (NOTE) Tricyclics + metabolites, urine    Cutoff 1000 ng/mL Amphetamines + metabolites, urine  Cutoff 1000 ng/mL MDMA (Ecstasy), urine              Cutoff 500 ng/mL Cocaine Metabolite, urine          Cutoff 300 ng/mL Opiate + metabolites, urine        Cutoff 300 ng/mL Phencyclidine (PCP), urine         Cutoff 25 ng/mL Cannabinoid,  urine                 Cutoff 50 ng/mL Barbiturates + metabolites, urine  Cutoff 200 ng/mL Benzodiazepine, urine              Cutoff 200 ng/mL Methadone, urine                   Cutoff 300 ng/mL  The urine drug screen provides only a preliminary, unconfirmed analytical test result and should not be used for non-medical purposes. Clinical consideration and professional judgment should be applied to any positive drug screen result due to possible interfering substances. A more specific alternate chemical method must be used in order to obtain a confirmed analytical result. Gas chromatography / mass spectrometry (GC/MS) is the preferred confirm atory method. Performed at [facility], [address] [place], [place] [address]   Urinalysis, Routine w reflex microscopic -Urine, Catheterized     Status: Abnormal   Collection Time: 02/25/23  3:18 AM  Result Value Ref Range   Color, Urine YELLOW (A) YELLOW   APPearance HAZY (A) CLEAR   Specific Gravity, Urine 1.031 (H) 1.005 - 1.030   pH 5.0 5.0 - 8.0   Glucose, UA NEGATIVE NEGATIVE mg/dL   Hgb urine dipstick NEGATIVE NEGATIVE   Bilirubin Urine NEGATIVE NEGATIVE   Ketones, ur 5 (A) NEGATIVE mg/dL   Protein, ur 30 (A) NEGATIVE mg/dL   Nitrite NEGATIVE NEGATIVE   Leukocytes,Ua NEGATIVE NEGATIVE   RBC / HPF 0-5 0 - 5 RBC/hpf   WBC, UA 0-5 0 - 5 WBC/hpf   Bacteria, UA NONE SEEN NONE SEEN   Squamous Epithelial / HPF 0-5 0 - 5 /HPF   Mucus PRESENT    Hyaline Casts, UA PRESENT     Comment: Performed at [facility], [address] [place], [place] [address]  Troponin I (High Sensitivity)     Status: None   Collection Time: 02/25/23  4:26 AM  Result Value Ref Range   Troponin I (High Sensitivity) <2 <18 ng/L    Comment: (NOTE) Elevated high sensitivity troponin I (hsTnI) values and significant  changes across serial measurements may suggest ACS but many other  chronic and acute conditions are known to elevate hsTnI  results.  Refer to the "Links" section for chest pain algorithms and additional  guidance. Performed at [facility], [address] [place], [place] [address]   HIV Antibody (routine testing w rflx)     Status: None   Collection Time: 02/25/23  5:10 AM  Result Value Ref Range   HIV Screen 4th Generation wRfx Non Reactive Non Reactive    Comment: Performed at [facility], [address], [place], [place] [address]  CBG monitoring, ED     Status: Abnormal   Collection Time: 02/25/23  5:14 AM  Result Value Ref Range   Glucose-Capillary 103 (  H) 70 - 99 mg/dL    Comment: Glucose reference range applies only to samples taken after fasting for at least 8 hours.  CBG monitoring, ED     Status: Abnormal   Collection Time: 02/25/23  7:37 AM  Result Value Ref Range   Glucose-Capillary 101 (H) 70 - 99 mg/dL    Comment: Glucose reference range applies only to samples taken after fasting for at least 8 hours.  CBG monitoring, ED     Status: None   Collection Time: 02/25/23  1:29 PM  Result Value Ref Range   Glucose-Capillary 82 70 - 99 mg/dL    Comment: Glucose reference range applies only to samples taken after fasting for at least 8 hours.  CBG monitoring, ED     Status: Abnormal   Collection Time: 02/25/23  3:44 PM  Result Value Ref Range   Glucose-Capillary 109 (H) 70 - 99 mg/dL    Comment: Glucose reference range applies only to samples taken after fasting for at least 8 hours.   CT Head Wo Contrast  Result Date: 02/25/2023 CLINICAL DATA:  Patient was found on floor. Slurred speech. Altered mental status. EXAM: CT HEAD WITHOUT CONTRAST TECHNIQUE: Contiguous axial images were obtained from the base of the skull through the vertex without intravenous contrast. RADIATION DOSE REDUCTION: This exam was performed according to the departmental dose-optimization program which includes automated exposure control, adjustment of the mA and/or kV according to patient size and/or use  of iterative reconstruction technique. COMPARISON:  CT head 12/20/2021 FINDINGS: Brain: No intracranial hemorrhage, mass effect, or evidence of acute infarct. No hydrocephalus. No extra-axial fluid collection. Vascular: No hyperdense vessel or unexpected calcification. Skull: No fracture or focal lesion. Sinuses/Orbits: No acute finding. Paranasal sinuses and mastoid air cells are well aerated. Other: None. IMPRESSION: No acute intracranial process. Electronically Signed   By: Minerva Fester M.D.   On: 02/25/2023 03:07   DG Chest Port 1 View  Result Date: 02/25/2023 CLINICAL DATA:  Overdose EXAM: PORTABLE CHEST 1 VIEW COMPARISON:  CT chest dated 11/13/2022 FINDINGS: Mild left upper lobe scarring. Right lung is clear. No pleural effusion or pneumothorax. The heart is top-normal in size.  Thoracic aortic atherosclerosis. IMPRESSION: No acute cardiopulmonary disease. Electronically Signed   By: Charline Bills M.D.   On: 02/25/2023 03:03    Pending Labs Unresulted Labs (From admission, onward)     Start     Ordered   03/04/23 0500  Creatinine, serum  (enoxaparin (LOVENOX)    CrCl >/= 30 ml/min)  Weekly,   STAT     Comments: while on enoxaparin therapy    02/25/23 0438   02/26/23 0500  CBC  Daily,   R      02/25/23 1555   02/26/23 0500  Basic metabolic panel  Daily,   R      02/25/23 1555            Vitals/Pain Today's Vitals   02/25/23 1530 02/25/23 1600 02/25/23 1630 02/25/23 1700  BP: 110/84 106/79 109/88 94/61  Pulse: 86 87 92 92  Resp: 10 18 19 17   Temp:      TempSrc:      SpO2: 99% 92% 100% 100%  Weight:      Height:      PainSc:        Isolation Precautions No active isolations  Medications Medications  sodium chloride flush (NS) 0.9 % injection 3 mL (0 mLs Intravenous Hold 02/25/23 0913)  insulin aspart (novoLOG)  injection 0-20 Units (0 Units Subcutaneous Hold 02/25/23 1608)  enoxaparin (LOVENOX) injection 65 mg (65 mg Subcutaneous Given 02/25/23 0912)  0.9 %   sodium chloride infusion ( Intravenous Rate/Dose Change 02/25/23 1333)  ondansetron (ZOFRAN) tablet 4 mg (has no administration in time range)    Or  ondansetron (ZOFRAN) injection 4 mg (has no administration in time range)  amitriptyline (ELAVIL) tablet 100 mg (has no administration in time range)  divalproex (DEPAKOTE) DR tablet 250 mg (250 mg Oral Given 02/25/23 1429)  rOPINIRole (REQUIP) tablet 0.5 mg (has no administration in time range)  nicotine (NICODERM CQ - dosed in mg/24 hours) patch 14 mg (14 mg Transdermal Patch Applied 02/25/23 1619)  venlafaxine XR (EFFEXOR-XR) 24 hr capsule 37.5 mg (has no administration in time range)  pravastatin (PRAVACHOL) tablet 40 mg (40 mg Oral Given 02/25/23 1719)  pantoprazole (PROTONIX) EC tablet 40 mg (40 mg Oral Given 02/25/23 1618)  sodium chloride 0.9 % bolus 1,000 mL (0 mLs Intravenous Stopped 02/25/23 0413)  naloxone (NARCAN) injection 0.4 mg (0.4 mg Intravenous Given 02/25/23 0413)    Mobility walks     Focused Assessments Neuro Assessment Handoff:  Swallow screen pass? Yes  Cardiac Rhythm: Normal sinus rhythm       Neuro Assessment: Within Defined Limits Neuro Checks:      Has TPA been given? No If patient is a Neuro Trauma and patient is going to OR before floor call report to 4N Charge nurse: [phone] or [phone]   R Recommendations: See Admitting Provider Note  Report given to:   Additional Notes:

## 2023-02-25 NOTE — ED Notes (Signed)
After giving Lovenox shot pt alert and stating need to go to the bathroom. Pt ambulated without assistance in room to toilet. Pt A+Ox4. Denies SI/HI, no AVH. Uncertain of details from last night. States that she did use marijuana prior to going to bed.

## 2023-02-25 NOTE — ED Provider Notes (Signed)
[facility] Provider Note    Event Date/Time   First MD Initiated Contact with Patient 02/25/23 0234     (approximate)   History   Drug Overdose   HPI  Level V caveat: Limited by decreased LOC  Sally Hughes is a 62 y.o. female brought to the ED via EMS from home with a chief complaint of probable overdose, decreased LOC.  History of COPD and depression.  Significant other found patient on the floor in their home with slurred speech and falling asleep in triage.  Significant other told EMS patient had some recent medication changes by her doctor.  Suspect intentional overdose.  Patient is quite somnolent but arousable to painful stimuli.  Rest of history is limited secondary to altered mental status.  Flexeril and hydroxyzine found in patient's purse.     Past Medical History   Past Medical History:  Diagnosis Date   Arthritis    whole body   Asthma    inhaler in summer   Back pain    Bell's palsy    12 yrs ago, right eye weaker   COPD (chronic obstructive pulmonary disease)    wheezing   Depression    Diabetes mellitus, type 2    Dyspnea    Family history of adverse reaction to anesthesia    mother would be irritable when waking up.   GERD (gastroesophageal reflux disease)    Headache    about everyday   Hepatitis C    resolved with medication   Hypertension    Kidney tumor (benign), left    Pneumonia    in past   Sleep apnea    needs a CPAP getting on soon   Snake bite    as a child     Active Problem List   Patient Active Problem List   Diagnosis Date Noted   Toxic encephalopathy 02/25/2023   Polypharmacy 02/25/2023   Chronic back pain 02/25/2023   Chronic obstructive pulmonary disease (COPD) 02/25/2023   Drug overdose, accidental or unintentional, sequela 02/25/2023   Lumbar spondylosis 02/11/2022   Lumbar degenerative disc disease 02/11/2022   Chronic migraine without aura without status migrainosus, not intractable  02/11/2022   Noncompliance with treatment regimen 09/14/2020   Tobacco use disorder 08/14/2020   MDD (major depressive disorder), recurrent episode, moderate 07/06/2020   GAD (generalized anxiety disorder) 07/06/2020   RLS (restless legs syndrome) 07/06/2020   Headache disorder 06/01/2020   Loss of memory 08/04/2019   Multiple fractures of ribs, bilateral, subsequent encounter for fracture with routine healing 08/10/2017     Past Surgical History   Past Surgical History:  Procedure Laterality Date   CARPOMETACARPAL (CMC) FUSION OF THUMB Left 11/27/2021   Procedure: LEFT THUMB CMC JOINT ARTHRTOPLASTY;  Surgeon: Christena Flake, MD;  Location: [facility];  Service: Orthopedics;  Laterality: Left;   CATARACT EXTRACTION W/PHACO Left 04/19/2018   Procedure: CATARACT EXTRACTION PHACO AND INTRAOCULAR LENS PLACEMENT (IOC) LEFT IVA TOPICAL;  Surgeon: Lockie Mola, MD;  Location: [facility];  Service: Ophthalmology;  Laterality: Left;   CATARACT EXTRACTION W/PHACO Right 05/19/2018   Procedure: CATARACT EXTRACTION PHACO AND INTRAOCULAR LENS PLACEMENT (IOC)  RIGHT;  Surgeon: Lockie Mola, MD;  Location: [facility];  Service: Ophthalmology;  Laterality: Right;   GANGLION CYST EXCISION Right 10/28/2022   Procedure: REMOVAL GANGLION OF WRIST;  Surgeon: Kennedy Bucker, MD;  Location: [facility];  Service: Orthopedics;  Laterality: Right;   MICROLARYNGOSCOPY Right 01/13/2020  Procedure: MICROLARYNGOSCOPY WITH EXCISION OF VC LESION;  Surgeon: Linus Salmons, MD;  Location: [facility];  Service: ENT;  Laterality: Right;  Diabetic - oral meds   TUBAL LIGATION     TUMOR REMOVAL     benign tumor removed left kidney   XI ROBOTIC ASSISTED VENTRAL HERNIA N/A 06/13/2020   Procedure: XI ROBOTIC ASSISTED INCISIONAL HERNIA;  Surgeon: Carolan Shiver, MD;  Location: [facility];  Service: General;  Laterality: N/A;     Home Medications   Prior to Admission  medications   Medication Sig Start Date End Date Taking? Authorizing Provider  albuterol (VENTOLIN HFA) 108 (90 Base) MCG/ACT inhaler Inhale 2 puffs into the lungs every 6 (six) hours as needed for wheezing or shortness of breath. 11/13/22   Salena Saner, MD  amitriptyline (ELAVIL) 100 MG tablet Take 100 mg by mouth at bedtime. 09/04/20   [provider]  Blood Glucose Monitoring Suppl (TRUE METRIX METER) DEVI SMARTSIG:1 Unit(s) Via Meter Daily 05/22/20   [provider]  celecoxib (CELEBREX) 200 MG capsule Take 1 capsule (200 mg total) by mouth daily. 11/27/21   Poggi, Excell Seltzer, MD  Cyanocobalamin (VITAMIN B12 PO) Take 1 tablet by mouth daily.     [provider]  cyclobenzaprine (FLEXERIL) 10 MG tablet Take 1 tablet by mouth 2 (two) times daily. 03/11/22   [provider]  hydrOXYzine (VISTARIL) 50 MG capsule Take 1 capsule (50 mg total) by mouth at bedtime. Patient not taking: Reported on 11/13/2022 11/27/21   Poggi, Excell Seltzer, MD  meclizine (ANTIVERT) 25 MG tablet Take 25 mg by mouth 3 (three) times daily as needed for dizziness or nausea.  04/26/20   [provider]  metFORMIN (GLUCOPHAGE-XR) 500 MG 24 hr tablet Take 250-500 mg by mouth See admin instructions. Take 500 mg by mouth daily in the morning and may take an additional 250 mg if needed for high blood sugar 05/30/20   [provider]  metoprolol tartrate (LOPRESSOR) 25 MG tablet Take 25 mg by mouth in the morning and at bedtime. 03/20/20   [provider]  mometasone-formoterol (DULERA) 100-5 MCG/ACT AERO Inhale into the lungs. 08/29/20 04/10/22  [provider]  Omega-3 Fatty Acids (FISH OIL PO) Take 1 capsule by mouth daily.    [provider]  omeprazole (PRILOSEC) 20 MG capsule Take 20 mg by mouth in the morning. 01/10/20   [provider]  Pharmacist Choice Lancets MISC Apply topically as needed.  01/17/20   [provider]  pravastatin (PRAVACHOL) 40  MG tablet Take 40 mg by mouth at bedtime.    [provider]  predniSONE (DELTASONE) 20 MG tablet Take 2 tablets by mouth daily for 4 days followed by 1 tablet by mouth daily for 4 days and stop 12 11/06/22   [provider]  rOPINIRole (REQUIP) 0.5 MG tablet TAKE (1) TABLET BY MOUTH DAILY AT BEDTIME FOR RESTLESS LEGS. 10/01/20   Jomarie Longs, MD  TRUE METRIX BLOOD GLUCOSE TEST test strip SMARTSIG:1 Via Meter 4-5 Times Daily 06/26/20   [provider]  venlafaxine XR (EFFEXOR-XR) 75 MG 24 hr capsule Take 75 mg by mouth in the morning and at bedtime.    [provider]  VITAMIN D PO Take 1 tablet by mouth daily.     [provider]  vitamin E 180 MG (400 UNITS) capsule Take 400 Units by mouth daily.    [provider]     Allergies  Buspirone,  Gabapentin, and Pregabalin   Family History   Family History  Problem Relation Age of Onset   Drug abuse Son      Physical Exam  Triage Vital Signs: ED Triage Vitals  Enc Vitals Group     BP 02/25/23 0232 110/75     Pulse Rate 02/25/23 0232 94     Resp --      Temp 02/25/23 0232 97.9 F (36.6 C)     Temp Source 02/25/23 0232 Oral     SpO2 02/25/23 0232 91 %     Weight 02/25/23 0227 290 lb (131.5 kg)     Height 02/25/23 0227 5\' 3"  (1.6 m)     Head Circumference --      Peak Flow --      Pain Score 02/25/23 0227 0     Pain Loc --      Pain Edu? --      Excl. in GC? --     Updated Vital Signs: BP 119/71   Pulse 81   Temp 97.9 F (36.6 C) (Oral)   Resp 13   Ht 5\' 3"  (1.6 m)   Wt 131.5 kg   SpO2 99%   BMI 51.37 kg/m    General: Somnolent but arousable to painful stimuli, mild distress.  CV:  RRR.  Good peripheral perfusion.  Resp:  Normal effort.  Diminished aeration otherwise CTAB. Abd:  Nontender.  No distention.  Other:  Slurred and mumbled speech.  Able to follow simple commands.  MAE x 4.  No external evidence of injury.   ED Results / Procedures / Treatments   Labs (all labs ordered are listed, but only abnormal results are displayed) Labs Reviewed  COMPREHENSIVE METABOLIC PANEL - Abnormal; Notable for the following components:      Result Value   Glucose, Bld 129 (*)    Creatinine, Ser 1.03 (*)    All other components within normal limits  ACETAMINOPHEN LEVEL - Abnormal; Notable for the following components:   Acetaminophen (Tylenol), Serum <10 (*)    All other components within normal limits  SALICYLATE LEVEL - Abnormal; Notable for the following components:   Salicylate Lvl <7.0 (*)    All other components within normal limits  URINE DRUG SCREEN, QUALITATIVE ([facility] ONLY) - Abnormal; Notable for the following components:   Tricyclic, Ur Screen POSITIVE (*)    Opiate, Ur Screen POSITIVE (*)    Cannabinoid 50 Ng, Ur [place] POSITIVE (*)    All other components within normal limits  URINALYSIS, ROUTINE W REFLEX MICROSCOPIC - Abnormal; Notable for the following components:   Color, Urine YELLOW (*)    APPearance HAZY (*)    Specific Gravity, Urine 1.031 (*)    Ketones, ur 5 (*)    Protein, ur 30 (*)    All other components within normal limits  BLOOD GAS, ARTERIAL - Abnormal; Notable for the following components:   pH, Arterial 7.34 (*)    pCO2 arterial 54 (*)    Bicarbonate 29.1 (*)    Acid-Base Excess 2.2 (*)    All other components within normal limits  VALPROIC ACID LEVEL - Abnormal; Notable for the following components:   Valproic Acid Lvl 37 (*)    All other components within normal limits  CBC  ETHANOL  HEMOGLOBIN A1C  HIV ANTIBODY (ROUTINE TESTING W REFLEX)  TROPONIN I (HIGH SENSITIVITY)  TROPONIN I (HIGH SENSITIVITY)     EKG  ED ECG REPORT I, Quinnton Bury J, the attending physician,  personally viewed and interpreted this ECG.   Date: 02/25/2023  EKG Time: 0252  Rate: 90  Rhythm: normal sinus rhythm  Axis: Normal  Intervals:none  ST&T Change: Nonspecific    RADIOLOGY I have independently visualized and interpreted  patient's CT and x-ray as well as noted the radiology interpretation:  CT head: No ICH  Chest x-ray: No acute cardiopulmonary process  Official radiology report(s): CT Head Wo Contrast  Result Date: 02/25/2023 CLINICAL DATA:  Patient was found on floor. Slurred speech. Altered mental status. EXAM: CT HEAD WITHOUT CONTRAST TECHNIQUE: Contiguous axial images were obtained from the base of the skull through the vertex without intravenous contrast. RADIATION DOSE REDUCTION: This exam was performed according to the departmental dose-optimization program which includes automated exposure control, adjustment of the mA and/or kV according to patient size and/or use of iterative reconstruction technique. COMPARISON:  CT head 12/20/2021 FINDINGS: Brain: No intracranial hemorrhage, mass effect, or evidence of acute infarct. No hydrocephalus. No extra-axial fluid collection. Vascular: No hyperdense vessel or unexpected calcification. Skull: No fracture or focal lesion. Sinuses/Orbits: No acute finding. Paranasal sinuses and mastoid air cells are well aerated. Other: None. IMPRESSION: No acute intracranial process. Electronically Signed   By: Minerva Fester M.D.   On: 02/25/2023 03:07   DG Chest Port 1 View  Result Date: 02/25/2023 CLINICAL DATA:  Overdose EXAM: PORTABLE CHEST 1 VIEW COMPARISON:  CT chest dated 11/13/2022 FINDINGS: Mild left upper lobe scarring. Right lung is clear. No pleural effusion or pneumothorax. The heart is top-normal in size.  Thoracic aortic atherosclerosis. IMPRESSION: No acute cardiopulmonary disease. Electronically Signed   By: Charline Bills M.D.   On: 02/25/2023 03:03     PROCEDURES:  Critical Care performed: Yes, see critical care procedure note(s)  CRITICAL CARE Performed by: Irean Hong   Total critical care time: 30 minutes  Critical care time was exclusive of separately billable procedures and treating other patients.  Critical care was necessary to treat or  prevent imminent or life-threatening deterioration.  Critical care was time spent personally by me on the following activities: development of treatment plan with patient and/or surrogate as well as nursing, discussions with consultants, evaluation of patient's response to treatment, examination of patient, obtaining history from patient or surrogate, ordering and performing treatments and interventions, ordering and review of laboratory studies, ordering and review of radiographic studies, pulse oximetry and re-evaluation of patient's condition.   Marland Kitchen1-3 Lead EKG Interpretation  Performed by: Irean Hong, MD Authorized by: Irean Hong, MD     Interpretation: normal     ECG rate:  90   ECG rate assessment: normal     Rhythm: sinus rhythm     Ectopy: none     Conduction: normal   Comments:     Patient placed on cardiac monitor to evaluate for arrhythmias    MEDICATIONS ORDERED IN ED: Medications  sodium chloride flush (NS) 0.9 % injection 3 mL (has no administration in time range)  insulin aspart (novoLOG) injection 0-20 Units (has no administration in time range)  enoxaparin (LOVENOX) injection 40 mg (has no administration in time range)  0.9 %  sodium chloride infusion (has no administration in time range)  ondansetron (ZOFRAN) tablet 4 mg (has no administration in time range)    Or  ondansetron (ZOFRAN) injection 4 mg (has no administration in time range)  sodium chloride 0.9 % bolus 1,000 mL (0 mLs Intravenous Stopped 02/25/23 0413)  naloxone (NARCAN) injection 0.4 mg (0.4 mg Intravenous  Given 02/25/23 0413)     IMPRESSION / MDM / ASSESSMENT AND PLAN / ED COURSE  I reviewed the triage vital signs and the nursing notes.                             62 year old female presenting with decreased mentation, suspected intentional overdose. Differential diagnosis includes, but is not limited to, alcohol, illicit or prescription medications, or other toxic ingestion; intracranial  pathology such as stroke or intracerebral hemorrhage; fever or infectious causes including sepsis; hypoxemia and/or hypercarbia; uremia; trauma; endocrine related disorders such as diabetes, hypoglycemia, and thyroid-related diseases; hypertensive encephalopathy; etc. I personally reviewed patient's records and note a recent PCP visit for diabetes maintenance on 02/12/2023 as well as telephone communication on 02/17/2023 starting Depakote for mood stability.  I have also noted her pulmonology office visit from 11/13/2022 and it does not look like patient is on chronic oxygen for her COPD.  Patient's presentation is most consistent with acute presentation with potential threat to life or bodily function.  The patient is on the cardiac monitor to evaluate for evidence of arrhythmia and/or significant heart rate changes.  Will obtain toxicological workup, CT head, chest x-ray given hypoxia.  Patient placed on 2 L nasal cannula oxygen with improvement of saturation to 99%.  Initiate IV fluid resuscitation, place patient under IVC for her safety given story of intentional overdose.  Will reassess.  Clinical Course as of 02/25/23 0440  Wed Feb 25, 2023  0419 No effect after IV Narcan.  CT head, chest x-ray unremarkable.  Lab work unremarkable.  Will consult hospital services for evaluation and admission. [JS]  0438 UDS positive for TCA, opiates, cannabinoids. [JS]    Clinical Course User Index [JS] Irean Hong, MD     FINAL CLINICAL IMPRESSION(S) / ED DIAGNOSES   Final diagnoses:  Overdose of undetermined intent, initial encounter  Altered mental status, unspecified altered mental status type  Marijuana use     Rx / DC Orders   ED Discharge Orders     None        Note:  This document was prepared using Dragon voice recognition software and may include unintentional dictation errors.   Irean Hong, MD 02/25/23 [phone]

## 2023-02-25 NOTE — Assessment & Plan Note (Signed)
Hold ropinirole tonight

## 2023-02-25 NOTE — ED Notes (Addendum)
Belongings inventory:  2 (1 pair) of slippers  1 black bra  1 orange/ teal tank top  1 navy purse-wallet, makeup, change purse, more lighters  4 lighters 1 pair of glasses   Medications:  Prescription for hydroxyzine  Doxycycline  Cyclobenzaprine Linzess  Tylenol  1 albuterol inhaler  Pill crusher  Inventory done with Immunologist and this Clinical research associate.

## 2023-02-25 NOTE — Progress Notes (Signed)
PHARMACIST - PHYSICIAN COMMUNICATION  CONCERNING:  Enoxaparin (Lovenox) for DVT Prophylaxis    RECOMMENDATION: Patient was prescribed enoxaprin  q24 hours for VTE prophylaxis.   Filed Weights   02/25/23 0227  Weight: 131.5 kg (290 lb)    Body mass index is 51.37 kg/m.  Estimated Creatinine Clearance: 76.1 mL/min (A) (by C-G formula based on SCr of 1.03 mg/dL (H)).   Based on [facility] policy patient is candidate for enoxaparin 0.5mg /kg TBW SQ every 24 hours based on BMI being >30.  DESCRIPTION: Pharmacy has adjusted enoxaparin dose per [facility] policy.  Patient is now receiving enoxaparin 0.5 mg/kg every 24 hours   Otelia Sergeant, PharmD, [facility] 02/25/2023 4:42 AM

## 2023-02-25 NOTE — Assessment & Plan Note (Addendum)
Polypharmacy Unintentional overdose Patient with increase in somnolence, suspected unintentional overdose Neurologic checks with fall and aspiration precautions Telemetry Hold sedating meds including hydroxyzine, Flexeril, amitriptyline, meclizine as well as ropinirole and Depakote Follow-up Depakote level

## 2023-02-25 NOTE — Progress Notes (Addendum)
PROGRESS NOTE    Sally Hughes  JWJ:[id number] DOB: [birth year] DOA: 02/25/2023 PCP: Enid Baas, MD   Assessment & Plan:   Principal Problem:   Drug overdose, accidental or unintentional, sequela Active Problems:   Toxic encephalopathy   GAD (generalized anxiety disorder)   RLS (restless legs syndrome)   Chronic migraine without aura without status migrainosus, not intractable   Polypharmacy   Chronic back pain   Chronic obstructive pulmonary disease (COPD)   Opiate use  Assessment and Plan: Toxic encephalopathy: likely due to polypharmacy & unintentional overdose. Continue w/ neuro checks. Mental status improved. CT head no acute intracranial abnormalities. Restart home dose of amitriptyline, depakote, ropinirole.   IVC: in the ER. Psych consulted    COPD: w/o exacerbation. Continue on bronchodilators    Chronic back pain: will restart home dose of amitriptyline. Holding home dose of flexeril    RLS: will restart home dose of ropinirole    Generalized anxiety disorder: severity unknown. Continue on home dose of effexor         DVT prophylaxis: lovenox  Code Status: full  Family Communication:  Disposition Plan:  unclear, currently IVC   Level of care: Progressive Status is: Inpatient Remains inpatient appropriate because: severity of illness    Consultants:  Psych   Procedures:   Antimicrobials:   Subjective: Pt c/o malaise   Objective: Vitals:   02/25/23 1230 02/25/23 1300 02/25/23 1400 02/25/23 1431  BP: 109/73 125/76 98/74   Pulse: 76 77 100   Resp:   16   Temp:    97.6 F (36.4 C)  TempSrc:    Axillary  SpO2: 98% 98% 100%   Weight:      Height:       No intake or output data in the 24 hours ending 02/25/23 1553 Filed Weights   02/25/23 0227  Weight: 131.5 kg    Examination:  General exam: Appears agitated & frustrated  Respiratory system: decreased breath sounds b/l  Cardiovascular system: S1 & S2+. No  rubs, gallops  or clicks.  Gastrointestinal system: Abdomen is nondistended, soft and nontender. Normal bowel sounds heard. Central nervous system: Alert and awake. Moves all extremities  Psychiatry: Judgement and insight appears poor. Agitated & frustrated mood and affect      Data Reviewed: I have personally reviewed following labs and imaging studies  CBC: Recent Labs  Lab 02/25/23 0231  WBC 9.1  HGB 12.4  HCT 39.2  MCV 97.0  PLT 224   Basic Metabolic Panel: Recent Labs  Lab 02/25/23 0231  NA 142  K 4.1  CL 105  CO2 30  GLUCOSE 129*  BUN 18  CREATININE 1.03*  CALCIUM 9.1   GFR: Estimated Creatinine Clearance: 76.1 mL/min (A) (by C-G formula based on SCr of 1.03 mg/dL (H)). Liver Function Tests: Recent Labs  Lab 02/25/23 0231  AST 26  ALT 17  ALKPHOS 59  BILITOT 0.5  PROT 7.5  ALBUMIN 3.9   No results for input(s): "LIPASE", "AMYLASE" in the last 168 hours. No results for input(s): "AMMONIA" in the last 168 hours. Coagulation Profile: No results for input(s): "INR", "PROTIME" in the last 168 hours. Cardiac Enzymes: No results for input(s): "CKTOTAL", "CKMB", "CKMBINDEX", "TROPONINI" in the last 168 hours. BNP (last 3 results) No results for input(s): "PROBNP" in the last 8760 hours. HbA1C: Recent Labs    02/25/23 0231  HGBA1C 6.5*   CBG: Recent Labs  Lab 02/25/23 0514 02/25/23 0737 02/25/23 1329 02/25/23 1544  GLUCAP 103* 101* 82 109*   Lipid Profile: No results for input(s): "CHOL", "HDL", "LDLCALC", "TRIG", "CHOLHDL", "LDLDIRECT" in the last 72 hours. Thyroid Function Tests: No results for input(s): "TSH", "T4TOTAL", "FREET4", "T3FREE", "THYROIDAB" in the last 72 hours. Anemia Panel: No results for input(s): "VITAMINB12", "FOLATE", "FERRITIN", "TIBC", "IRON", "RETICCTPCT" in the last 72 hours. Sepsis Labs: No results for input(s): "PROCALCITON", "LATICACIDVEN" in the last 168 hours.  No results found for this or any previous visit (from the past 240  hour(s)).       Radiology Studies: CT Head Wo Contrast  Result Date: 02/25/2023 CLINICAL DATA:  Patient was found on floor. Slurred speech. Altered mental status. EXAM: CT HEAD WITHOUT CONTRAST TECHNIQUE: Contiguous axial images were obtained from the base of the skull through the vertex without intravenous contrast. RADIATION DOSE REDUCTION: This exam was performed according to the departmental dose-optimization program which includes automated exposure control, adjustment of the mA and/or kV according to patient size and/or use of iterative reconstruction technique. COMPARISON:  CT head 12/20/2021 FINDINGS: Brain: No intracranial hemorrhage, mass effect, or evidence of acute infarct. No hydrocephalus. No extra-axial fluid collection. Vascular: No hyperdense vessel or unexpected calcification. Skull: No fracture or focal lesion. Sinuses/Orbits: No acute finding. Paranasal sinuses and mastoid air cells are well aerated. Other: None. IMPRESSION: No acute intracranial process. Electronically Signed   By: Minerva Fester M.D.   On: 02/25/2023 03:07   DG Chest Port 1 View  Result Date: 02/25/2023 CLINICAL DATA:  Overdose EXAM: PORTABLE CHEST 1 VIEW COMPARISON:  CT chest dated 11/13/2022 FINDINGS: Mild left upper lobe scarring. Right lung is clear. No pleural effusion or pneumothorax. The heart is top-normal in size.  Thoracic aortic atherosclerosis. IMPRESSION: No acute cardiopulmonary disease. Electronically Signed   By: Charline Bills M.D.   On: 02/25/2023 03:03        Scheduled Meds:  amitriptyline  100 mg Oral QHS   divalproex  250 mg Oral Q12H   enoxaparin (LOVENOX) injection  0.5 mg/kg Subcutaneous Q24H   insulin aspart  0-20 Units Subcutaneous Q4H   nicotine  14 mg Transdermal Daily   rOPINIRole  0.5 mg Oral QHS   sodium chloride flush  3 mL Intravenous Q12H   Continuous Infusions:  sodium chloride 75 mL/hr at 02/25/23 1333     LOS: 0 days    Time spent: 35 mins      Charise Killian, MD [facility] Pager [phone]  If 7PM-7AM, please contact night-coverage [email or web address] 02/25/2023, 3:53 PM

## 2023-02-25 NOTE — Assessment & Plan Note (Signed)
Not acutely exacerbated.  DuoNeb as needed 

## 2023-02-25 NOTE — Consult Note (Signed)
[facility] Face-to-Face Psychiatry Consult   Reason for Consult:  "IVC in the ER" Referring Physician:   Patient Identification: Sally Hughes MRN:  [id number] Principal Diagnosis: Drug overdose, accidental or unintentional, sequela Diagnosis:  Principal Problem:   Drug overdose, accidental or unintentional, sequela Active Problems:   GAD (generalized anxiety disorder)   RLS (restless legs syndrome)   Chronic migraine without aura without status migrainosus, not intractable   Toxic encephalopathy   Polypharmacy   Chronic back pain   Chronic obstructive pulmonary disease (COPD)   Opiate use   Total Time spent with patient: 1 hour  Subjective:   Sally Hughes is a 62 y.o. female patient admitted with overdose.  HPI: Patient seen and chart reviewed. Patient sees [facility] outpatient at [facility]. Her provider prescribed Depakote and Effexor for her mood symptoms, with diagnosis listed "Bipolar disorder."    On evaluation, Patient is alert and oriented x 4.  She is speaking in clear, coherent sentences with  good eye contact. She adamantly denies she took an overdose in a suicide attempt. She states "I have 3 grandbabies that I want to see grow up." Writer advised patient that her UDS was positive for opiates and she does not have an active prescription for opioids. Patient admits to taking something that a friend gave me for back pain" and offers that as an explanation. Patient admits to marijuana uses. Patient denies suicidal thought or intent, homicidal ideation, auditory or visual hallucinations. Perceptions appear to be intact.    Patient was IVC'd by EDP when she arrived. IVC released by this Clinical research associate at this time. Attempted to call son, Sally Hughes [phone]), number given to me by bedside RN): No answer and no ability to leave voicemail.   Past Psychiatric History: Per chart review, has seen Dr. Elna Breslow, with Dx of MDD and GAD (Aug 2021-Dec 2021); Trials of BuSpar,  trazodone, hydroxyzine,lamotrigine. (Neurology prescribed amitriptyline for migraines). Patient stopped taking medications and did not follow through with psychotherapy.  Patient denies any suicide attempts or psychiatric hospitalizations.   Risk to Self:   Risk to Others:   Prior Inpatient Therapy:   Prior Outpatient Therapy:    Past Medical History:  Past Medical History:  Diagnosis Date   Arthritis    whole body   Asthma    inhaler in summer   Back pain    Bell's palsy    12 yrs ago, right eye weaker   COPD (chronic obstructive pulmonary disease)    wheezing   Depression    Diabetes mellitus, type 2    Dyspnea    Family history of adverse reaction to anesthesia    mother would be irritable when waking up.   GERD (gastroesophageal reflux disease)    Headache    about everyday   Hepatitis C    resolved with medication   Hypertension    Kidney tumor (benign), left    Pneumonia    in past   Sleep apnea    needs a CPAP getting on soon   Snake bite    as a child    Past Surgical History:  Procedure Laterality Date   CARPOMETACARPAL (CMC) FUSION OF THUMB Left 11/27/2021   Procedure: LEFT THUMB CMC JOINT ARTHRTOPLASTY;  Surgeon: Christena Flake, MD;  Location: [facility];  Service: Orthopedics;  Laterality: Left;   CATARACT EXTRACTION W/PHACO Left 04/19/2018   Procedure: CATARACT EXTRACTION PHACO AND INTRAOCULAR LENS PLACEMENT (IOC) LEFT IVA TOPICAL;  Surgeon: Lockie Mola, MD;  Location: [facility];  Service: Ophthalmology;  Laterality: Left;   CATARACT EXTRACTION W/PHACO Right 05/19/2018   Procedure: CATARACT EXTRACTION PHACO AND INTRAOCULAR LENS PLACEMENT (IOC)  RIGHT;  Surgeon: Lockie Mola, MD;  Location: [facility];  Service: Ophthalmology;  Laterality: Right;   GANGLION CYST EXCISION Right 10/28/2022   Procedure: REMOVAL GANGLION OF WRIST;  Surgeon: Kennedy Bucker, MD;  Location: [facility];  Service: Orthopedics;  Laterality: Right;    MICROLARYNGOSCOPY Right 01/13/2020   Procedure: MICROLARYNGOSCOPY WITH EXCISION OF VC LESION;  Surgeon: Linus Salmons, MD;  Location: [facility];  Service: ENT;  Laterality: Right;  Diabetic - oral meds   TUBAL LIGATION     TUMOR REMOVAL     benign tumor removed left kidney   XI ROBOTIC ASSISTED VENTRAL HERNIA N/A 06/13/2020   Procedure: XI ROBOTIC ASSISTED INCISIONAL HERNIA;  Surgeon: Carolan Shiver, MD;  Location: [facility];  Service: General;  Laterality: N/A;   Family History:  Family History  Problem Relation Age of Onset   Drug abuse Son    Family Psychiatric  History: none reported Social History:  Social History   Substance and Sexual Activity  Alcohol Use No     Social History   Substance and Sexual Activity  Drug Use Not Currently   Types: Marijuana   Comment: last use one year ago - reported 07/06/20    Social History   Socioeconomic History   Marital status: Divorced    Spouse name: Not on file   Number of children: 2   Years of education: Not on file   Highest education level: Not on file  Occupational History   Not on file  Tobacco Use   Smoking status: Every Day    Packs/day: 3.00    Years: 30.00    Additional pack years: 0.00    Total pack years: 90.00    Types: Cigarettes   Smokeless tobacco: Never   Tobacco comments:    1.5 packs a day, sometimes 2 packs depending on stress- 11/13/2022  Vaping Use   Vaping Use: Former   Substances: Nicotine   Devices: Vuse  Substance and Sexual Activity   Alcohol use: No   Drug use: Not Currently    Types: Marijuana    Comment: last use one year ago - reported 07/06/20   Sexual activity: Not on file  Other Topics Concern   Not on file  Social History Narrative   Went up to 9 th grade   Used to be a weaver , worked in [employer].   Now on disability.   Social Determinants of Health   Financial Resource Strain: Not on file  Food Insecurity: Not on file  Transportation Needs: Not on file   Physical Activity: Not on file  Stress: Not on file  Social Connections: Not on file   Additional Social History:    Allergies:   Allergies  Allergen Reactions   Buspirone     confused   Gabapentin Hives and Other (See Comments)    Involuntary jerking and hives   Pregabalin Other (See Comments)    Mental changes    Labs:  Results for orders placed or performed during the hospital encounter of 02/25/23 (from the past 48 hour(s))  CBC     Status: None   Collection Time: 02/25/23  2:31 AM  Result Value Ref Range   WBC 9.1 4.0 - 10.5 K/uL   RBC 4.04 3.87 - 5.11 MIL/uL   Hemoglobin 12.4 12.0 -  15.0 g/dL   HCT 16.1 09.6 - 04.5 %   MCV 97.0 80.0 - 100.0 fL   MCH 30.7 26.0 - 34.0 pg   MCHC 31.6 30.0 - 36.0 g/dL   RDW 40.9 81.1 - 91.4 %   Platelets 224 150 - 400 K/uL   nRBC 0.0 0.0 - 0.2 %    Comment: Performed at [facility], [address] [place], [place] [address]  Comprehensive metabolic panel     Status: Abnormal   Collection Time: 02/25/23  2:31 AM  Result Value Ref Range   Sodium 142 135 - 145 mmol/L   Potassium 4.1 3.5 - 5.1 mmol/L   Chloride 105 98 - 111 mmol/L   CO2 30 22 - 32 mmol/L   Glucose, Bld 129 (H) 70 - 99 mg/dL    Comment: Glucose reference range applies only to samples taken after fasting for at least 8 hours.   BUN 18 8 - 23 mg/dL   Creatinine, Ser 6.21 (H) 0.44 - 1.00 mg/dL   Calcium 9.1 8.9 - 30.8 mg/dL   Total Protein 7.5 6.5 - 8.1 g/dL   Albumin 3.9 3.5 - 5.0 g/dL   AST 26 15 - 41 U/L   ALT 17 0 - 44 U/L   Alkaline Phosphatase 59 38 - 126 U/L   Total Bilirubin 0.5 0.3 - 1.2 mg/dL   GFR, Estimated >65 >78 mL/min    Comment: (NOTE) Calculated using the CKD-EPI Creatinine Equation (2021)    Anion gap 7 5 - 15    Comment: Performed at [facility], [address], [place], [place] [address]  Acetaminophen level     Status: Abnormal   Collection Time: 02/25/23  2:31 AM  Result Value Ref Range   Acetaminophen (Tylenol),  Serum <10 (L) 10 - 30 ug/mL    Comment: (NOTE) Therapeutic concentrations vary significantly. A range of 10-30 ug/mL  may be an effective concentration for many patients. However, some  are best treated at concentrations outside of this range. Acetaminophen concentrations >150 ug/mL at 4 hours after ingestion  and >50 ug/mL at 12 hours after ingestion are often associated with  toxic reactions.  Performed at [facility], [address] [place], [place] [address]   Ethanol     Status: None   Collection Time: 02/25/23  2:31 AM  Result Value Ref Range   Alcohol, Ethyl (B) <10 <10 mg/dL    Comment: (NOTE) Lowest detectable limit for serum alcohol is 10 mg/dL.  For medical purposes only. Performed at [facility], [address] [place], [place] [address]   Salicylate level     Status: Abnormal   Collection Time: 02/25/23  2:31 AM  Result Value Ref Range   Salicylate Lvl <7.0 (L) 7.0 - 30.0 mg/dL    Comment: Performed at [facility], [address] [place], [place] [address]  Troponin I (High Sensitivity)     Status: None   Collection Time: 02/25/23  2:31 AM  Result Value Ref Range   Troponin I (High Sensitivity) 3 <18 ng/L    Comment: (NOTE) Elevated high sensitivity troponin I (hsTnI) values and significant  changes across serial measurements may suggest ACS but many other  chronic and acute conditions are known to elevate hsTnI results.  Refer to the "Links" section for chest pain algorithms and additional  guidance. Performed at [facility], [address], [place], [place] [address]   Valproic acid level  Status: Abnormal   Collection Time: 02/25/23  2:31 AM  Result Value Ref Range   Valproic Acid Lvl 37 (L) 50.0 - 100.0 ug/mL    Comment: Performed at [facility], [address] [place], [place] [address]  Hemoglobin A1c     Status: Abnormal   Collection Time: 02/25/23  2:31 AM  Result Value Ref Range   Hgb  A1c MFr Bld 6.5 (H) 4.8 - 5.6 %    Comment: (NOTE) Pre diabetes:          5.7%-6.4%  Diabetes:              >6.4%  Glycemic control for   <7.0% adults with diabetes    Mean Plasma Glucose 139.85 mg/dL    Comment: Performed at [facility], [address], [place], [place] [address]  Blood gas, arterial     Status: Abnormal   Collection Time: 02/25/23  3:15 AM  Result Value Ref Range   O2 Content 3.0 L/min   Delivery systems NASAL CANNULA    pH, Arterial 7.34 (L) 7.35 - 7.45   pCO2 arterial 54 (H) 32 - 48 mmHg   pO2, Arterial 96 83 - 108 mmHg   Bicarbonate 29.1 (H) 20.0 - 28.0 mmol/L   Acid-Base Excess 2.2 (H) 0.0 - 2.0 mmol/L   O2 Saturation 97.3 %   Patient temperature 37.0    Collection site LEFT RADIAL    Allens test (pass/fail) PASS PASS    Comment: Performed at [facility], [address] [place], [place] [address]  Urine Drug Screen, Qualitative     Status: Abnormal   Collection Time: 02/25/23  3:18 AM  Result Value Ref Range   Tricyclic, Ur Screen POSITIVE (A) NONE DETECTED   Amphetamines, Ur Screen NONE DETECTED NONE DETECTED   MDMA (Ecstasy)Ur Screen NONE DETECTED NONE DETECTED   Cocaine Metabolite,Ur [place] NONE DETECTED NONE DETECTED   Opiate, Ur Screen POSITIVE (A) NONE DETECTED   Phencyclidine (PCP) Ur S NONE DETECTED NONE DETECTED   Cannabinoid 50 Ng, Ur [place] POSITIVE (A) NONE DETECTED   Barbiturates, Ur Screen NONE DETECTED NONE DETECTED   Benzodiazepine, Ur Scrn NONE DETECTED NONE DETECTED   Methadone Scn, Ur NONE DETECTED NONE DETECTED    Comment: (NOTE) Tricyclics + metabolites, urine    Cutoff 1000 ng/mL Amphetamines + metabolites, urine  Cutoff 1000 ng/mL MDMA (Ecstasy), urine              Cutoff 500 ng/mL Cocaine Metabolite, urine          Cutoff 300 ng/mL Opiate + metabolites, urine        Cutoff 300 ng/mL Phencyclidine (PCP), urine         Cutoff 25 ng/mL Cannabinoid, urine                 Cutoff 50 ng/mL Barbiturates + metabolites,  urine  Cutoff 200 ng/mL Benzodiazepine, urine              Cutoff 200 ng/mL Methadone, urine                   Cutoff 300 ng/mL  The urine drug screen provides only a preliminary, unconfirmed analytical test result and should not be used for non-medical purposes. Clinical consideration and professional judgment should be applied to any positive drug screen result due to possible interfering substances. A more specific alternate chemical method must be used in order to obtain a confirmed analytical result.  Gas chromatography / mass spectrometry (GC/MS) is the preferred confirm atory method. Performed at [facility], [address] [place], [place] [address]   Urinalysis, Routine w reflex microscopic -Urine, Catheterized     Status: Abnormal   Collection Time: 02/25/23  3:18 AM  Result Value Ref Range   Color, Urine YELLOW (A) YELLOW   APPearance HAZY (A) CLEAR   Specific Gravity, Urine 1.031 (H) 1.005 - 1.030   pH 5.0 5.0 - 8.0   Glucose, UA NEGATIVE NEGATIVE mg/dL   Hgb urine dipstick NEGATIVE NEGATIVE   Bilirubin Urine NEGATIVE NEGATIVE   Ketones, ur 5 (A) NEGATIVE mg/dL   Protein, ur 30 (A) NEGATIVE mg/dL   Nitrite NEGATIVE NEGATIVE   Leukocytes,Ua NEGATIVE NEGATIVE   RBC / HPF 0-5 0 - 5 RBC/hpf   WBC, UA 0-5 0 - 5 WBC/hpf   Bacteria, UA NONE SEEN NONE SEEN   Squamous Epithelial / HPF 0-5 0 - 5 /HPF   Mucus PRESENT    Hyaline Casts, UA PRESENT     Comment: Performed at [facility], [address], [place], [place] [address]  Troponin I (High Sensitivity)     Status: None   Collection Time: 02/25/23  4:26 AM  Result Value Ref Range   Troponin I (High Sensitivity) <2 <18 ng/L    Comment: (NOTE) Elevated high sensitivity troponin I (hsTnI) values and significant  changes across serial measurements may suggest ACS but many other  chronic and acute conditions are known to elevate hsTnI results.  Refer to the "Links" section for chest pain algorithms and  additional  guidance. Performed at [facility], [address] [place], [place] [address]   HIV Antibody (routine testing w rflx)     Status: None   Collection Time: 02/25/23  5:10 AM  Result Value Ref Range   HIV Screen 4th Generation wRfx Non Reactive Non Reactive    Comment: Performed at [facility], [address], [place], [place] [address]  CBG monitoring, ED     Status: Abnormal   Collection Time: 02/25/23  5:14 AM  Result Value Ref Range   Glucose-Capillary 103 (H) 70 - 99 mg/dL    Comment: Glucose reference range applies only to samples taken after fasting for at least 8 hours.  CBG monitoring, ED     Status: Abnormal   Collection Time: 02/25/23  7:37 AM  Result Value Ref Range   Glucose-Capillary 101 (H) 70 - 99 mg/dL    Comment: Glucose reference range applies only to samples taken after fasting for at least 8 hours.  CBG monitoring, ED     Status: None   Collection Time: 02/25/23  1:29 PM  Result Value Ref Range   Glucose-Capillary 82 70 - 99 mg/dL    Comment: Glucose reference range applies only to samples taken after fasting for at least 8 hours.  CBG monitoring, ED     Status: Abnormal   Collection Time: 02/25/23  3:44 PM  Result Value Ref Range   Glucose-Capillary 109 (H) 70 - 99 mg/dL    Comment: Glucose reference range applies only to samples taken after fasting for at least 8 hours.    Current Facility-Administered Medications  Medication Dose Route Frequency Provider Last Rate Last Admin   0.9 %  sodium chloride infusion   Intravenous Continuous Charise Killian, MD 75 mL/hr at 02/25/23 1333 Rate Change at 02/25/23 1333   amitriptyline (ELAVIL) tablet 100 mg  100 mg  Oral QHS Charise Killian, MD       divalproex (DEPAKOTE) DR tablet 250 mg  250 mg Oral Q12H Charise Killian, MD   250 mg at 02/25/23 1429   enoxaparin (LOVENOX) injection 65 mg  0.5 mg/kg Subcutaneous Q24H Andris Baumann, MD   65 mg at 02/25/23 0912   insulin aspart  (novoLOG) injection 0-20 Units  0-20 Units Subcutaneous Q4H Andris Baumann, MD       nicotine (NICODERM CQ - dosed in mg/24 hours) patch 14 mg  14 mg Transdermal Daily Charise Killian, MD   14 mg at 02/25/23 1619   ondansetron (ZOFRAN) tablet 4 mg  4 mg Oral Q6H PRN Andris Baumann, MD       Or   ondansetron [facility]) injection 4 mg  4 mg Intravenous Q6H PRN Andris Baumann, MD       pantoprazole (PROTONIX) EC tablet 40 mg  40 mg Oral Daily Charise Killian, MD   40 mg at 02/25/23 1618   pravastatin (PRAVACHOL) tablet 40 mg  40 mg Oral q1800 Charise Killian, MD       rOPINIRole (REQUIP) tablet 0.5 mg  0.5 mg Oral QHS Charise Killian, MD       sodium chloride flush (NS) 0.9 % injection 3 mL  3 mL Intravenous Q12H Andris Baumann, MD       [START ON 02/26/2023] venlafaxine XR (EFFEXOR-XR) 24 hr capsule 37.5 mg  37.5 mg Oral Q breakfast Charise Killian, MD       Current Outpatient Medications  Medication Sig Dispense Refill   albuterol (VENTOLIN HFA) 108 (90 Base) MCG/ACT inhaler Inhale 2 puffs into the lungs every 6 (six) hours as needed for wheezing or shortness of breath. 1 each 0   amitriptyline (ELAVIL) 100 MG tablet Take 100 mg by mouth at bedtime.     celecoxib (CELEBREX) 200 MG capsule Take 1 capsule (200 mg total) by mouth daily. 30 capsule 0   Cyanocobalamin (VITAMIN B12 PO) Take 1 tablet by mouth daily.      cyclobenzaprine (FLEXERIL) 10 MG tablet Take 1 tablet by mouth 2 (two) times daily.     divalproex (DEPAKOTE) 250 MG DR tablet Take 250 mg by mouth 2 (two) times daily.     LINZESS 145 MCG CAPS capsule Take 145 mcg by mouth every morning.     meclizine (ANTIVERT) 25 MG tablet Take 25 mg by mouth 3 (three) times daily as needed for dizziness or nausea.      meloxicam (MOBIC) 15 MG tablet Take 15 mg by mouth daily as needed.     metFORMIN (GLUCOPHAGE-XR) 500 MG 24 hr tablet Take 250-500 mg by mouth See admin instructions. Take 500 mg by mouth daily in the morning and  may take an additional 250 mg if needed for high blood sugar     metoprolol tartrate (LOPRESSOR) 25 MG tablet Take 25 mg by mouth in the morning and at bedtime.     Omega-3 Fatty Acids (FISH OIL PO) Take 1 capsule by mouth daily.     omeprazole (PRILOSEC) 20 MG capsule Take 20 mg by mouth in the morning.     pravastatin (PRAVACHOL) 40 MG tablet Take 40 mg by mouth at bedtime.     rOPINIRole (REQUIP) 0.5 MG tablet TAKE (1) TABLET BY MOUTH DAILY AT BEDTIME FOR RESTLESS LEGS. 30 tablet 0   venlafaxine XR (EFFEXOR-XR) 37.5 MG 24 hr capsule Take 37.5 mg  by mouth daily.     VITAMIN D PO Take 1 tablet by mouth daily.      vitamin E 180 MG (400 UNITS) capsule Take 400 Units by mouth daily.     Blood Glucose Monitoring Suppl (TRUE METRIX METER) DEVI SMARTSIG:1 Unit(s) Via Meter Daily     hydrOXYzine (VISTARIL) 50 MG capsule Take 1 capsule (50 mg total) by mouth at bedtime. (Patient not taking: Reported on 11/13/2022)     mometasone-formoterol (DULERA) 100-5 MCG/ACT AERO Inhale into the lungs.     Pharmacist Choice Lancets MISC Apply topically as needed.      predniSONE (DELTASONE) 20 MG tablet Take 2 tablets by mouth daily for 4 days followed by 1 tablet by mouth daily for 4 days and stop 12     TRUE METRIX BLOOD GLUCOSE TEST test strip SMARTSIG:1 Via Meter 4-5 Times Daily     venlafaxine XR (EFFEXOR-XR) 75 MG 24 hr capsule Take 75 mg by mouth in the morning and at bedtime. (Patient not taking: Reported on 02/25/2023)     Facility-Administered Medications Ordered in Other Encounters  Medication Dose Route Frequency Provider Last Rate Last Admin   dexamethasone (DECADRON) injection 10 mg  10 mg Other Once Edward Jolly, MD       dexamethasone (DECADRON) injection 10 mg  10 mg Other Once Edward Jolly, MD       lidocaine (XYLOCAINE) 2 % (with pres) injection 400 mg  20 mL Infiltration Once Lateef, Bilal, MD       ropivacaine (PF) 2 mg/mL (0.2%) (NAROPIN) injection 9 mL  9 mL Peri-NEURAL Once Lateef, Bilal,  MD       ropivacaine (PF) 2 mg/mL (0.2%) (NAROPIN) injection 9 mL  9 mL Peri-NEURAL Once Edward Jolly, MD        Musculoskeletal: Strength & Muscle Tone: decreased Gait & Station:  did not observe Patient leans: N/A     Psychiatric Specialty Exam:  Presentation  General Appearance: Casual  Eye Contact:Good  Speech:Clear and Coherent  Speech Volume:Normal  Handedness:No data recorded  Mood and Affect  Mood:Euthymic  Affect:Congruent   Thought Process  Thought Processes:Coherent  Descriptions of Associations:Intact  Orientation:Full (Time, Place and Person)  Thought Content:WDL  History of Schizophrenia/Schizoaffective disorder:No data recorded Duration of Psychotic Symptoms:No data recorded Hallucinations:Hallucinations: None  Ideas of Reference:None  Suicidal Thoughts:Suicidal Thoughts: No  Homicidal Thoughts:Homicidal Thoughts: No   Sensorium  Memory:Immediate Good  Judgment:Fair  Insight:Fair   Executive Functions  Concentration:Fair  Attention Span:Fair  Recall:Fair  Fund of Knowledge:Fair  Language:Fair   Psychomotor Activity  Psychomotor Activity:Psychomotor Activity: Normal   Assets  Assets:Desire for Improvement; Financial Resources/Insurance; Resilience; Social Support   Sleep  Sleep:Sleep: Fair   Physical Exam: Physical Exam Vitals and nursing note reviewed.  HENT:     Head: Normocephalic.     Mouth/Throat:     Pharynx: No oropharyngeal exudate or posterior oropharyngeal erythema.  Eyes:     General:        Right eye: No discharge.        Left eye: No discharge.  Cardiovascular:     Rate and Rhythm: Normal rate.  Pulmonary:     Effort: Pulmonary effort is normal.  Musculoskeletal:     Cervical back: Normal range of motion.  Neurological:     Mental Status: She is alert and oriented to person, place, and time.  Psychiatric:        Attention and Perception: She is inattentive.  Mood and Affect:  Affect is angry (irritable).        Speech: Speech normal.        Behavior: Behavior is cooperative.        Thought Content: Thought content normal. Thought content is not paranoid or delusional. Thought content does not include homicidal or suicidal ideation.        Cognition and Memory: Cognition normal.        Judgment: Judgment normal.    Review of Systems  Constitutional: Negative.   HENT: Negative.    Respiratory: Negative.    Cardiovascular: Negative.   Musculoskeletal:  Positive for back pain.  Psychiatric/Behavioral:  Positive for substance abuse. Negative for depression (denies), hallucinations, memory loss and suicidal ideas (denies). The patient is not nervous/anxious and does not have insomnia.        Per chart review, bipolar disorder   Blood pressure 98/74, pulse 100, temperature 97.6 F (36.4 C), temperature source Axillary, resp. rate 16, height 5\' 3"  (1.6 m), weight 131.5 kg, SpO2 100 %. Body mass index is 51.37 kg/m.  Treatment Plan Summary: Plan Patient denies intentional overdose, denies suicide attempt. Denies depression. Denies suicidal thought or intent, auditory or visual hallucinations. Patient speaks in clear, coherent sentences during interview. Patient admits to taking "something for pain that my friend gave me."  Recommend that patient follow up with her outpatient provider at [facility] for her mental health meds as well as follow up with pain clinic.  Strongly encouraged psychotherapy as outpatient. Reviewed with Dr. Mayford Knife   Disposition: No evidence of imminent risk to self or others at present.   Patient does not meet criteria for psychiatric inpatient admission. Supportive therapy provided about ongoing stressors. Discussed crisis plan, support from social network, calling 911, coming to the Emergency Department, and calling Suicide Hotline.  Vanetta Mulders, NP 02/25/2023 4:34 PM

## 2023-02-25 NOTE — Assessment & Plan Note (Signed)
Can continue Effexor
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
## 2023-02-25 NOTE — ED Notes (Signed)
IVC retracted by NP

## 2023-02-25 NOTE — ED Notes (Signed)
IVC PAPERS  RESCINDED  PER  LOUISE  NP  INFORMED  RN  Chrissie Noa  RN

## 2023-02-25 NOTE — ED Triage Notes (Addendum)
EMS brings pt in from home; received a call from SO regarding pt was found on the floor ; pt with slurred speech, falling asleep in triage; SO reports pt had some "medication changes"

## 2023-02-25 NOTE — ED Notes (Signed)
Pt requests no information or visits from son Sandi Mariscal

## 2023-02-25 NOTE — Assessment & Plan Note (Signed)
Chronic migraine Avoid sedating meds.  Hold Flexeril and amitriptyline for now until more awake Toradol as needed

## 2023-02-26 DIAGNOSIS — T50901S Poisoning by unspecified drugs, medicaments and biological substances, accidental (unintentional), sequela: Secondary | ICD-10-CM | POA: Diagnosis not present

## 2023-02-26 DIAGNOSIS — F411 Generalized anxiety disorder: Secondary | ICD-10-CM | POA: Diagnosis not present

## 2023-02-26 DIAGNOSIS — G9340 Encephalopathy, unspecified: Secondary | ICD-10-CM

## 2023-02-26 DIAGNOSIS — T50901A Poisoning by unspecified drugs, medicaments and biological substances, accidental (unintentional), initial encounter: Secondary | ICD-10-CM | POA: Diagnosis not present

## 2023-02-26 LAB — BASIC METABOLIC PANEL
Anion gap: 7 (ref 5–15)
BUN: 8 mg/dL (ref 8–23)
CO2: 28 mmol/L (ref 22–32)
Calcium: 8.3 mg/dL — ABNORMAL LOW (ref 8.9–10.3)
Chloride: 109 mmol/L (ref 98–111)
Creatinine, Ser: 0.75 mg/dL (ref 0.44–1.00)
GFR, Estimated: >60 mL/min (ref >60)
Glucose, Bld: 117 mg/dL — ABNORMAL HIGH (ref 70–99)
Potassium: 3.9 mmol/L (ref 3.5–5.1)
Sodium: 144 mmol/L (ref 135–145)

## 2023-02-26 LAB — CBC
HCT: 36.2 % (ref 36.0–46.0)
Hemoglobin: 11.3 g/dL — ABNORMAL LOW (ref 12.0–15.0)
MCH: 30.5 pg (ref 26.0–34.0)
MCHC: 31.2 g/dL (ref 30.0–36.0)
MCV: 97.6 fL (ref 80.0–100.0)
Platelets: 176 10*3/uL (ref 150–400)
RBC: 3.71 MIL/uL — ABNORMAL LOW (ref 3.87–5.11)
RDW: 12.8 % (ref 11.5–15.5)
WBC: 6.2 10*3/uL (ref 4.0–10.5)
nRBC: 0 % (ref 0.0–0.2)

## 2023-02-26 LAB — GLUCOSE, CAPILLARY
Glucose-Capillary: 106 mg/dL — ABNORMAL HIGH (ref 70–99)
Glucose-Capillary: 109 mg/dL — ABNORMAL HIGH (ref 70–99)
Glucose-Capillary: 155 mg/dL — ABNORMAL HIGH (ref 70–99)
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
## 2023-02-26 NOTE — Discharge Summary (Signed)
Physician Discharge Summary  GLYNNIS GAVEL ZOX:[id number] DOB: [birth year] DOA: 02/25/2023  PCP: Enid Baas, MD  Admit date: 02/25/2023 Discharge date: 02/26/2023  Admitted From: home  Disposition:  home   Recommendations for Outpatient Follow-up:  Follow up with PCP in 1-2 weeks Recommend pt get a referral from PCP to see a pain management physician   Home Health: no  Equipment/Devices:  Discharge Condition: stable CODE STATUS: full  Diet recommendation: Carb Modified  Brief/Interim Summary: HPI was taken from Dr. Para March: Sally Hughes is a 62 y.o. female with medical history significant for COPD, diabetes, chronic low back pain with sciatica, chronic headaches, depression, anxiety with recent mood changes, on titrating doses of Effexor, recently seen by her PCP on 02/12/2023, who was brought to the ED by EMS after she was found on the floor with slurred speech.  She was noted to be very somnolent, reporting that she had some medication changes.  She was not able to contribute meaningfully to history due to falling asleep when awakened.  On reviewing records patient got a prescription for hydroxyzine 50 mg and Flexeril 10 mg on 02/17/2023.  Depakote was also added to her regimen.  Additionally she appears to be on amitriptyline, meclizine, ropinirole and divalproex as well as Effexor.this is the other overdose. I looked at a recent pulmonary note and she is not on oxygen at baseline. labs, CT, cxr negative. No response to IV Narcan.  In the ED, they found flexeril, depakote, hydroxyzine in her purse.  ED course and data review: Vitals within normal limits.  ABG with pH 7.34, pCO2 54 and pO2 96.  EtOH less than 10.  CBC unremarkable, CMP unremarkable.  Acetaminophen and salicylate levels pending.  Troponin 3.Patient changes. G, personally viewed and interpreted showing sinus rhythm at 90 with no concerning ST-T wave changes. CT head nonacute Chest x-ray nonacute   Patient had  no response to Narcan in the emergency room.  She remained very somnolent. Patient was given an IV fluid bolus Hospitalist consulted for admission.   Pt was admitted w/ toxic encephalopathy likely secondary to polypharmacy & "taking something for pain that my friend gave me." Urine drug screen was positive for cannabinoids, opiates & tricyclics. CT head showed no acute intracranial abnormalities. Mental status was back to baseline prior to d/c. Pt did receive education on not taking prescription medications that were not prescribed to her. Pt verbalized her understanding.   Discharge Diagnoses:  Principal Problem:   Drug overdose, accidental or unintentional, sequela Active Problems:   Toxic encephalopathy   GAD (generalized anxiety disorder)   RLS (restless legs syndrome)   Chronic migraine without aura without status migrainosus, not intractable   Polypharmacy   Chronic back pain   Chronic obstructive pulmonary disease (COPD)   Opiate use  Toxic encephalopathy: likely due to polypharmacy & unintentional overdose. Continue w/ neuro checks. Mental status improved. CT head no acute intracranial abnormalities. Continue on home dose of amitriptyline, depakote, ropinirole.   IVC: in the ER. IVC rescinded by psych on 02/25/23. Does not meet inpatient psych criteria as per psych    COPD: w/o exacerbation. Continue on bronchodilators    Chronic back pain: continue on home dose of amitriptyline   RLS: continue on home dose of  ropinirole    Generalized anxiety disorder: severity unknown. Continue on home dose of effexor   Discharge Instructions  Discharge Instructions     Diet Carb Modified   Complete by: As directed  Discharge instructions   Complete by: As directed    F/u w/ PCP in 1-2 weeks. Request a referral from your PCP to see a pain management physician.   Increase activity slowly   Complete by: As directed       Allergies as of 02/26/2023       Reactions   Buspirone     confused   Gabapentin Hives, Other (See Comments)   Involuntary jerking and hives   Pregabalin Other (See Comments)   Mental changes        Medication List     TAKE these medications    albuterol 108 (90 Base) MCG/ACT inhaler Commonly known as: VENTOLIN HFA Inhale 2 puffs into the lungs every 6 (six) hours as needed for wheezing or shortness of breath.   amitriptyline 100 MG tablet Commonly known as: ELAVIL Take 100 mg by mouth at bedtime.   celecoxib 200 MG capsule Commonly known as: CELEBREX Take 1 capsule (200 mg total) by mouth daily.   cyclobenzaprine 10 MG tablet Commonly known as: FLEXERIL Take 1 tablet by mouth 2 (two) times daily.   divalproex 250 MG DR tablet Commonly known as: DEPAKOTE Take 250 mg by mouth 2 (two) times daily.   FISH OIL PO Take 1 capsule by mouth daily.   hydrOXYzine 50 MG capsule Commonly known as: VISTARIL Take 1 capsule (50 mg total) by mouth at bedtime.   Linzess 145 MCG Caps capsule Generic drug: linaclotide Take 145 mcg by mouth every morning.   meclizine 25 MG tablet Commonly known as: ANTIVERT Take 25 mg by mouth 3 (three) times daily as needed for dizziness or nausea.   meloxicam 15 MG tablet Commonly known as: MOBIC Take 15 mg by mouth daily as needed.   metFORMIN 500 MG 24 hr tablet Commonly known as: GLUCOPHAGE-XR Take 250-500 mg by mouth See admin instructions. Take 500 mg by mouth daily in the morning and may take an additional 250 mg if needed for high blood sugar   metoprolol tartrate 25 MG tablet Commonly known as: LOPRESSOR Take 25 mg by mouth in the morning and at bedtime.   mometasone-formoterol 100-5 MCG/ACT Aero Commonly known as: DULERA Inhale into the lungs.   omeprazole 20 MG capsule Commonly known as: PRILOSEC Take 20 mg by mouth in the morning.   Pharmacist Choice Lancets Misc Apply topically as needed.   pravastatin 40 MG tablet Commonly known as: PRAVACHOL Take 40 mg by mouth at  bedtime.   predniSONE 20 MG tablet Commonly known as: DELTASONE Take 2 tablets by mouth daily for 4 days followed by 1 tablet by mouth daily for 4 days and stop 12   rOPINIRole 0.5 MG tablet Commonly known as: REQUIP TAKE (1) TABLET BY MOUTH DAILY AT BEDTIME FOR RESTLESS LEGS.   True Metrix Blood Glucose Test test strip Generic drug: glucose blood SMARTSIG:1 Via Meter 4-5 Times Daily   True Metrix Meter Devi SMARTSIG:1 Unit(s) Via Meter Daily   venlafaxine XR 37.5 MG 24 hr capsule Commonly known as: EFFEXOR-XR Take 37.5 mg by mouth daily. What changed: Another medication with the same name was removed. Continue taking this medication, and follow the directions you see here.   VITAMIN B12 PO Take 1 tablet by mouth daily.   VITAMIN D PO Take 1 tablet by mouth daily.   vitamin E 180 MG (400 UNITS) capsule Take 400 Units by mouth daily.        Allergies  Allergen Reactions   Buspirone  confused   Gabapentin Hives and Other (See Comments)    Involuntary jerking and hives   Pregabalin Other (See Comments)    Mental changes    Consultations: Psych    Procedures/Studies: CT Head Wo Contrast  Result Date: 02/25/2023 CLINICAL DATA:  Patient was found on floor. Slurred speech. Altered mental status. EXAM: CT HEAD WITHOUT CONTRAST TECHNIQUE: Contiguous axial images were obtained from the base of the skull through the vertex without intravenous contrast. RADIATION DOSE REDUCTION: This exam was performed according to the departmental dose-optimization program which includes automated exposure control, adjustment of the mA and/or kV according to patient size and/or use of iterative reconstruction technique. COMPARISON:  CT head 12/20/2021 FINDINGS: Brain: No intracranial hemorrhage, mass effect, or evidence of acute infarct. No hydrocephalus. No extra-axial fluid collection. Vascular: No hyperdense vessel or unexpected calcification. Skull: No fracture or focal lesion.  Sinuses/Orbits: No acute finding. Paranasal sinuses and mastoid air cells are well aerated. Other: None. IMPRESSION: No acute intracranial process. Electronically Signed   By: Minerva Fester M.D.   On: 02/25/2023 03:07   DG Chest Port 1 View  Result Date: 02/25/2023 CLINICAL DATA:  Overdose EXAM: PORTABLE CHEST 1 VIEW COMPARISON:  CT chest dated 11/13/2022 FINDINGS: Mild left upper lobe scarring. Right lung is clear. No pleural effusion or pneumothorax. The heart is top-normal in size.  Thoracic aortic atherosclerosis. IMPRESSION: No acute cardiopulmonary disease. Electronically Signed   By: Charline Bills M.D.   On: 02/25/2023 03:03   DG Ankle Complete Left  Result Date: 01/31/2023 CLINICAL DATA:  Pain EXAM: LEFT ANKLE COMPLETE - 3 VIEW COMPARISON:  None Available. FINDINGS: There is no evidence of fracture, dislocation, or joint effusion. There is no evidence of arthropathy or other focal bone abnormality. Soft tissues are unremarkable. Large plantar calcaneal spur. IMPRESSION: Large plantar calcaneal spur.  No acute osseous abnormalities. Electronically Signed   By: Layla Maw M.D.   On: 01/31/2023 13:38   (Echo, Carotid, EGD, Colonoscopy, ERCP)    Subjective: Pt denies any complaints   Discharge Exam: Vitals:   02/26/23 0426 02/26/23 0817  BP: 92/76 110/69  Pulse: 77 86  Resp: 18 16  Temp: 97.6 F (36.4 C) (!) 97.5 F (36.4 C)  SpO2: 100% 100%   Vitals:   02/25/23 2020 02/25/23 2343 02/26/23 0426 02/26/23 0817  BP: (!) 88/65 108/60 92/76 110/69  Pulse: 91 87 77 86  Resp: Temp: 97.7 F (36.5 C) 97.6 F (36.4 C) 97.6 F (36.4 C) (!) 97.5 F (36.4 C)  TempSrc:      SpO2: 95% 98% 100% 100%  Weight:   131.2 kg   Height:        General: Pt is alert, awake, not in acute distress Cardiovascular: S1/S2 +, no rubs, no gallops Respiratory: decreased breath sounds b/l  Abdominal: Soft, NT, obese, bowel sounds + Extremities: no edema, no cyanosis    The  results of significant diagnostics from this hospitalization (including imaging, microbiology, ancillary and laboratory) are listed below for reference.     Microbiology: No results found for this or any previous visit (from the past 240 hour(s)).   Labs: BNP (last 3 results) No results for input(s): "BNP" in the last 8760 hours. Basic Metabolic Panel: Recent Labs  Lab 02/25/23 0231 02/26/23 0535  NA 142 144  K 4.1 3.9  CL 105 109  CO2 30 28  GLUCOSE 129* 117*  BUN 18 8  CREATININE 1.03* 0.75  CALCIUM  9.1 8.3*   Liver Function Tests: Recent Labs  Lab 02/25/23 0231  AST 26  ALT 17  ALKPHOS 59  BILITOT 0.5  PROT 7.5  ALBUMIN 3.9   No results for input(s): "LIPASE", "AMYLASE" in the last 168 hours. No results for input(s): "AMMONIA" in the last 168 hours. CBC: Recent Labs  Lab 02/25/23 0231 02/26/23 0535  WBC 9.1 6.2  HGB 12.4 11.3*  HCT 39.2 36.2  MCV 97.0 97.6  PLT 224 176   Cardiac Enzymes: No results for input(s): "CKTOTAL", "CKMB", "CKMBINDEX", "TROPONINI" in the last 168 hours. BNP: Invalid input(s): "POCBNP" CBG: Recent Labs  Lab 02/25/23 1544 02/25/23 2123 02/25/23 2342 02/26/23 0428 02/26/23 0815  GLUCAP 109* 118* 107* 106* 109*   D-Dimer No results for input(s): "DDIMER" in the last 72 hours. Hgb A1c Recent Labs    02/25/23 0231  HGBA1C 6.5*   Lipid Profile No results for input(s): "CHOL", "HDL", "LDLCALC", "TRIG", "CHOLHDL", "LDLDIRECT" in the last 72 hours. Thyroid function studies No results for input(s): "TSH", "T4TOTAL", "T3FREE", "THYROIDAB" in the last 72 hours.  Invalid input(s): "FREET3" Anemia work up No results for input(s): "VITAMINB12", "FOLATE", "FERRITIN", "TIBC", "IRON", "RETICCTPCT" in the last 72 hours. Urinalysis    Component Value Date/Time   COLORURINE YELLOW (A) 02/25/2023 0318   APPEARANCEUR HAZY (A) 02/25/2023 0318   LABSPEC 1.031 (H) 02/25/2023 0318   PHURINE 5.0 02/25/2023 0318   GLUCOSEU NEGATIVE  02/25/2023 0318   HGBUR NEGATIVE 02/25/2023 0318   BILIRUBINUR NEGATIVE 02/25/2023 0318   KETONESUR 5 (A) 02/25/2023 0318   PROTEINUR 30 (A) 02/25/2023 0318   NITRITE NEGATIVE 02/25/2023 0318   LEUKOCYTESUR NEGATIVE 02/25/2023 0318   Sepsis Labs Recent Labs  Lab 02/25/23 0231 02/26/23 0535  WBC 9.1 6.2   Microbiology No results found for this or any previous visit (from the past 240 hour(s)).   Time coordinating discharge: Over 30 minutes  SIGNED:   Charise Killian, MD  [facility] 02/26/2023, 11:45 AM Pager   If 7PM-7AM, please contact night-coverage [email or web address]

## 2023-02-26 NOTE — Plan of Care (Signed)
  Problem: Education: Goal: Ability to describe self-care measures that may prevent or decrease complications (Diabetes Survival Skills Education) will improve Outcome: Progressing Goal: Individualized Educational Video(s) Outcome: Progressing   Problem: Coping: Goal: Ability to adjust to condition or change in health will improve Outcome: Progressing   Problem: Fluid Volume: Goal: Ability to maintain a balanced intake and output will improve Outcome: Progressing   Problem: Health Behavior/Discharge Planning: Goal: Ability to identify and utilize available resources and services will improve Outcome: Progressing Goal: Ability to manage health-related needs will improve Outcome: Progressing   Problem: Metabolic: Goal: Ability to maintain appropriate glucose levels will improve Outcome: Progressing   Problem: Nutritional: Goal: Maintenance of adequate nutrition will improve Outcome: Progressing Goal: Progress toward achieving an optimal weight will improve Outcome: Progressing   Problem: Skin Integrity: Goal: Risk for impaired skin integrity will decrease Outcome: Progressing   Problem: Tissue Perfusion: Goal: Adequacy of tissue perfusion will improve Outcome: Progressing   Problem: Education: Goal: Knowledge of condition and prescribed therapy will improve Outcome: Progressing   Problem: Cardiac: Goal: Will achieve and/or maintain adequate cardiac output Outcome: Progressing   Problem: Physical Regulation: Goal: Complications related to the disease process, condition or treatment will be avoided or minimized Outcome: Progressing   Problem: Education: Goal: Knowledge of General Education information will improve Description: Including pain rating scale, medication(s)/side effects and non-pharmacologic comfort measures Outcome: Progressing   Problem: Health Behavior/Discharge Planning: Goal: Ability to manage health-related needs will improve Outcome:  Progressing   Problem: Clinical Measurements: Goal: Ability to maintain clinical measurements within normal limits will improve Outcome: Progressing Goal: Will remain free from infection Outcome: Progressing Goal: Diagnostic test results will improve Outcome: Progressing Goal: Respiratory complications will improve Outcome: Progressing Goal: Cardiovascular complication will be avoided Outcome: Progressing   Problem: Activity: Goal: Risk for activity intolerance will decrease Outcome: Progressing   Problem: Nutrition: Goal: Adequate nutrition will be maintained Outcome: Progressing   Problem: Coping: Goal: Level of anxiety will decrease Outcome: Progressing   Problem: Elimination: Goal: Will not experience complications related to bowel motility Outcome: Progressing Goal: Will not experience complications related to urinary retention Outcome: Progressing   Problem: Pain Managment: Goal: General experience of comfort will improve Outcome: Progressing   Problem: Safety: Goal: Ability to remain free from injury will improve Outcome: Progressing   Problem: Skin Integrity: Goal: Risk for impaired skin integrity will decrease Outcome: Progressing   

## 2023-02-26 NOTE — Care Management Obs Status (Signed)
MEDICARE OBSERVATION STATUS NOTIFICATION   Patient Details  Name: Sally Hughes MRN: [id number] Date of Birth: [birth year]   Medicare Observation Status Notification Given:  Yes    Margarito Liner, LCSW 02/26/2023, 12:45 PM

## 2023-02-26 NOTE — TOC Transition Note (Signed)
Transition of Care [facility]) - CM/SW Discharge Note   Patient Details  Name: Sally Hughes MRN: [id number] Date of Birth: [birth year]  [facility] CM/SW Contact:  Margarito Liner, LCSW Phone Number: 02/26/2023, 1:02 PM   Clinical Narrative: Patient has orders to discharge home today. Gave shirt, pants, socks, and shoes to wear home. No further concerns. CSW signing off.    Final next level of care: Home/Self Care Barriers to Discharge: Barriers Resolved   Patient Goals and CMS Choice      Discharge Placement                  Patient to be transferred to facility by: Sister   Patient and family notified of of transfer: 02/26/23  Discharge Plan and Services Additional resources added to the After Visit Summary for       Post Acute Care Choice: NA                               Social Determinants of Health (SDOH) Interventions SDOH Screenings   Depression (PHQ2-9): Low Risk  (06/30/2022)  Tobacco Use: High Risk (02/25/2023)     Readmission Risk Interventions     No data to display

## 2023-02-26 NOTE — Care Management CC44 (Signed)
Condition Code 44 Documentation Completed  Patient Details  Name: Sally Hughes MRN: [id number] Date of Birth: 1961/11/03   Condition Code 44 given:  Yes Patient signature on Condition Code 44 notice:  Yes Documentation of 2 MD's agreement:  Yes Code 44 added to claim:  Yes    Margarito Liner, LCSW 02/26/2023, 12:45 PM

## 2023-02-26 NOTE — Evaluation (Signed)
Physical Therapy Evaluation Patient Details Name: Sally Hughes MRN: [id number] DOB: 05-08-1961 Today's Date: 02/26/2023  History of Present Illness  Pt is a 62 y.o. female presenting to hospital 02/25/23 with chief complaint of probable overdose; decreased LOC.  Per notes, pt's significant other found pt on floor in their home with slurred speech; pt falling asleep in triage; report of some recent medication changes by her doctor.  Pt admitted with toxic encephalopathy, polypharmacy, unintentional overdose.  PMH includes COPD, depression, back pain with sciatica, Bell's palsy (R eye weaker), DM, Hep C, htn, PNA, sleep apnea, and RLS.  Clinical Impression  Prior to hospital admission, pt was independent with functional mobility; lives in 1 level home (with ramp to enter) with an older gentlemen (with h/o stroke) who pt takes care of (assists with bathing, dressing, cooking, cleaning).  Currently pt is independent with bed mobility, transfers, and ambulating 120 feet (no AD use).  Pt steady with all functional mobility but limited distance ambulating d/t chronic LBP (5-6/10); nurse notified of pt's request for pain meds.  Pt on room air upon PT arrival (pt had taken off nasal cannula) and SpO2 94%; nurse cleared therapy to trial pt on room air during session; pt's SpO2 sats 94% or greater on room air during sessions activities (nurse cleared PT to keep pt on room air end of session).  Pt appears at baseline in terms of functional mobility; pt reports no concerns for home discharge; PT will sign off at this time.   Recommendations for follow up therapy are one component of a multi-disciplinary discharge planning process, led by the attending physician.  Recommendations may be updated based on patient status, additional functional criteria and insurance authorization.        Assistance Recommended at Discharge None  Patient can return home with the following       Equipment Recommendations None  recommended by PT  Recommendations for Other Services       Functional Status Assessment Patient has not had a recent decline in their functional status     Precautions / Restrictions Precautions Precautions: Fall Restrictions Weight Bearing Restrictions: No      Mobility  Bed Mobility Overal bed mobility: Independent             General bed mobility comments: Supine to/from sitting    Transfers Overall transfer level: Independent Equipment used: None               General transfer comment: steady safe transfer from bed    Ambulation/Gait Ambulation/Gait assistance: Independent Gait Distance (Feet): 120 Feet Assistive device: None Gait Pattern/deviations: WFL(Within Functional Limits) Gait velocity: mildly decreased     General Gait Details: steady step through gait pattern  Stairs Stairs:  (deferred (pt has ramp to enter home))          Wheelchair Mobility    Modified Rankin (Stroke Patients Only)       Balance Overall balance assessment: Independent, Needs assistance Sitting-balance support: No upper extremity supported, Feet supported Sitting balance-Leahy Scale: Normal Sitting balance - Comments: steady sitting reaching outside BOS   Standing balance support: No upper extremity supported, During functional activity Standing balance-Leahy Scale: Normal Standing balance comment: steady ambulating in hallway                             Pertinent Vitals/Pain Pain Assessment Pain Assessment: 0-10 Pain Score: 6  Pain Location: chronic LBP Pain Descriptors /  Indicators: Aching Pain Intervention(s): Limited activity within patient's tolerance, Monitored during session, Repositioned, Patient requesting pain meds-RN notified HR WFL during sessions activities.    Home Living Family/patient expects to be discharged to:: Private residence Living Arrangements: Non-relatives/Friends   Type of Home: Mobile home Home Access:  Ramped entrance       Home Layout: One level Home Equipment: Agricultural consultant (2 wheels) Additional Comments: Pt lives with an older gentleman (with h/o stroke) who pt takes care of (helps with bathing, dressing, cooking, cleaning); and in return pt lives with this older gentleman    Prior Function Prior Level of Function : Independent/Modified Independent             Mobility Comments: Independent. ADLs Comments: Independent.     Hand Dominance        Extremity/Trunk Assessment   Upper Extremity Assessment Upper Extremity Assessment: Overall WFL for tasks assessed    Lower Extremity Assessment Lower Extremity Assessment: Overall WFL for tasks assessed    Cervical / Trunk Assessment Cervical / Trunk Assessment: Normal  Communication   Communication: No difficulties  Cognition Arousal/Alertness: Awake/alert Behavior During Therapy: WFL for tasks assessed/performed Overall Cognitive Status: Within Functional Limits for tasks assessed                                          General Comments  Nursing cleared pt for participation in physical therapy.  Pt agreeable to PT session.   Exercises     Assessment/Plan    PT Assessment Patient does not need any further PT services  PT Problem List         PT Treatment Interventions      PT Goals (Current goals can be found in the Care Plan section)  Acute Rehab PT Goals Patient Stated Goal: to go home today PT Goal Formulation: With patient Time For Goal Achievement: 02/26/23 Potential to Achieve Goals: Good    Frequency       Co-evaluation               AM-PAC PT "6 Clicks" Mobility  Outcome Measure Help needed turning from your back to your side while in a flat bed without using bedrails?: None Help needed moving from lying on your back to sitting on the side of a flat bed without using bedrails?: None Help needed moving to and from a bed to a chair (including a wheelchair)?:  None Help needed standing up from a chair using your arms (e.g., wheelchair or bedside chair)?: None Help needed to walk in hospital room?: None Help needed climbing 3-5 steps with a railing? : None 6 Click Score: 24    End of Session Equipment Utilized During Treatment: Gait belt Activity Tolerance: Patient tolerated treatment well Patient left: in bed;with call bell/phone within reach;with bed alarm set Nurse Communication: Mobility status;Patient requests pain meds;Precautions (nurse cleared pt to stay on room air) PT Visit Diagnosis: Muscle weakness (generalized) (M62.81)    Time: 1610-9604 PT Time Calculation (min) (ACUTE ONLY): 18 min   Charges:   PT Evaluation $PT Eval Low Complexity: 1 Low         Klaus Casteneda, PT 02/26/23, 11:04 AM

## 2023-03-18 ENCOUNTER — Other Ambulatory Visit: Payer: Self-pay

## 2023-03-18 ENCOUNTER — Emergency Department
Admission: EM | Admit: 2023-03-18 | Discharge: 2023-03-18 | Disposition: A | Discharge status: Home / Self Care | Payer: 59 | Attending: Emergency Medicine | Admitting: Emergency Medicine

## 2023-03-18 DIAGNOSIS — T40601A Poisoning by unspecified narcotics, accidental (unintentional), initial encounter: Secondary | ICD-10-CM | POA: Diagnosis not present

## 2023-03-18 DIAGNOSIS — T50901A Poisoning by unspecified drugs, medicaments and biological substances, accidental (unintentional), initial encounter: Secondary | ICD-10-CM | POA: Diagnosis present

## 2023-03-18 MED ORDER — LACTATED RINGERS IV BOLUS
1000.0000 mL | Freq: Once | INTRAVENOUS | Status: AC
  Administered 2023-03-18: 1000 mL via INTRAVENOUS

## 2023-03-18 NOTE — ED Provider Notes (Signed)
[facility] Provider Note    Event Date/Time   First MD Initiated Contact with Patient 03/18/23 0142     (approximate)   History   Drug Overdose   HPI  Sally Hughes is a 62 y.o. female who presents to the ED for evaluation of Drug Overdose   I reviewed 4/18 medical DC summary where she was admitted overnight for encephalopathy associated with polypharmacy and recreational polysubstance abuse.  Patient reports to getting oxycodone and morphine from a friend that she uses to treat her chronic pain as her PCP "refuses or just cannot help me."  She reports taking "at least 2" of her oxycodones tonight before bed.  She reports her roommate called 911 and EMS reports finding her somnolent with slow respirations, improved with Narcan.  She denies any suicidal intent.  Denies any recent illnesses reports that she was just try to control her pain and get some sleep  Physical Exam   Triage Vital Signs: ED Triage Vitals  Enc Vitals Group     BP 03/18/23 0146 113/62     Pulse Rate 03/18/23 0146 91     Resp 03/18/23 0148 19     Temp 03/18/23 0146 98 F (36.7 C)     Temp Source 03/18/23 0146 Oral     SpO2 03/18/23 0146 100 %     Weight --      Height --      Head Circumference --      Peak Flow --      Pain Score --      Pain Loc --      Pain Edu? --      Excl. in GC? --     Most recent vital signs: Vitals:   03/18/23 0400 03/18/23 0500  BP: (!) 91/58 104/65  Pulse: 80 75  Resp: 12 10  Temp:    SpO2: 92% 94%    General: Awake, no distress.  Ambulatory independently.  Conversational CV:  Good peripheral perfusion.  Resp:  Normal effort.  Abd:  No distention.  Soft MSK:  No deformity noted.  No signs of trauma Neuro:  No focal deficits appreciated. Cranial nerves II through XII intact 5/5 strength and sensation in all 4 extremities Other:     ED Results / Procedures / Treatments   Labs (all labs ordered are listed, but only  abnormal results are displayed) Labs Reviewed - No data to display  EKG Sinus rhythm with a rate of 84 bpm.  Normal axis and intervals.  No clear signs of acute ischemia  RADIOLOGY   Official radiology report(s): No results found.  PROCEDURES and INTERVENTIONS:  .1-3 Lead EKG Interpretation  Performed by: Delton Prairie, MD Authorized by: Delton Prairie, MD     Interpretation: normal     ECG rate:  88   ECG rate assessment: normal     Rhythm: sinus rhythm     Ectopy: none     Conduction: normal     Medications  lactated ringers bolus 1,000 mL (0 mLs Intravenous Stopped 03/18/23 0436)     IMPRESSION / MDM / ASSESSMENT AND PLAN / ED COURSE  I reviewed the triage vital signs and the nursing notes.  Differential diagnosis includes, but is not limited to, intentional overdose, accidental overdose, polysubstance abuse, poisoning  {Patient presents with symptoms of an acute illness or injury that is potentially life-threatening.  62 year old woman presents to the ED with what appears to be an accidental  opiate overdose.  We will observe her for a few hours to ensure that she does not require any additional doses of Narcan.  No clear signs or symptoms of any other or additional underlying pathology to require diagnostics.  We will keep her on telemetry provide some gentle fluids and reassess.  EKG is reassuring without evidence of interval changes that would suggest a cardiogenic syncope  She is observed for 5+ hours without indications for repeat dosing of Narcan.  She is ambulatory, tolerating p.o. and suitable for outpatient management.  We are calling her son Christiane Ha to come pick her up.  Clinical Course as of 03/18/23 0622  Wed Mar 18, 2023  0428 Reassessed.  Feeling well.  No complaints.  Normal vitals [DS]    Clinical Course User Index [DS] Delton Prairie, MD     FINAL CLINICAL IMPRESSION(S) / ED DIAGNOSES   Final diagnoses:  Opiate overdose, accidental or unintentional,  initial encounter [facility])     Rx / DC Orders   ED Discharge Orders     None        Note:  This document was prepared using Dragon voice recognition software and may include unintentional dictation errors.   Delton Prairie, MD 03/18/23 [phone]

## 2023-03-18 NOTE — ED Triage Notes (Signed)
Pt found at home with GCS 11 and decreased rep effort, 2 mg Narcan given w/ + results, found by bystander in her living room.

## 2023-03-18 NOTE — ED Notes (Signed)
This RN did not discharge pt, pt had left prior to having vitals rechecked. Pt not in room and night shift RN stated pt left when son arrived.  
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
## 2023-07-16 ENCOUNTER — Ambulatory Visit: Payer: 59 | Admitting: Psychiatry

## 2023-09-08 ENCOUNTER — Ambulatory Visit: Payer: 59 | Admitting: Pulmonary Disease

## 2023-09-17 ENCOUNTER — Encounter: Payer: Self-pay | Admitting: Pulmonary Disease

## 2023-09-17 ENCOUNTER — Ambulatory Visit: Payer: 59 | Admitting: Pulmonary Disease

## 2023-09-17 VITALS — BP 110/70 | HR 99 | Temp 97.5°F | Ht 63.0 in | Wt 177.0 lb

## 2023-09-17 DIAGNOSIS — F17210 Nicotine dependence, cigarettes, uncomplicated: Secondary | ICD-10-CM | POA: Diagnosis not present

## 2023-09-17 DIAGNOSIS — J84115 Respiratory bronchiolitis interstitial lung disease: Secondary | ICD-10-CM

## 2023-09-17 DIAGNOSIS — J449 Chronic obstructive pulmonary disease, unspecified: Secondary | ICD-10-CM

## 2023-09-17 NOTE — Patient Instructions (Signed)
VISIT SUMMARY:  During today's visit, we discussed your respiratory health, specifically your COPD and recent cold symptoms. We also reviewed your smoking habits and lung cancer screening schedule. Additionally, we talked about your general health maintenance, particularly regarding flu and pneumonia vaccines.  YOUR PLAN:  -CHRONIC OBSTRUCTIVE PULMONARY DISEASE (COPD): COPD is a chronic lung condition that makes it hard to breathe. You mentioned experiencing shortness of breath mainly during humid weather and using your albuterol inhaler occasionally. We discussed the importance of quitting smoking to improve your lung health, as no medication can reverse the damage caused by smoking. Please continue using your albuterol inhaler as needed.  -LUNG CANCER SCREENING: Lung cancer screening involves regular low-dose CT scans to detect any early signs of lung cancer. You are enrolled in this program, with your next scan scheduled for January 2025. We will ensure that your records are accessible to your pain clinic and other relevant providers.  -GENERAL HEALTH MAINTENANCE: Due to past adverse reactions, you should avoid flu and pneumonia vaccines. We will continue to monitor your overall health and address any concerns as they arise.  INSTRUCTIONS:  Please follow up in six months. If any issues arise from the lung cancer screening program, contact us immediately.

## 2023-09-17 NOTE — Progress Notes (Signed)
Subjective:    Patient ID: Sally Hughes, female    DOB: [birth year], 62 y.o.   MRN: [id number]  Patient Care Team: Enid Baas, MD as PCP - General (Internal Medicine) Salena Saner, MD as Consulting Physician (Pulmonary Disease)  Chief Complaint  Patient presents with   Follow-up    No SOB or wheezing. Dry cough.     BACKGROUND/INTERVAL: Patient is a 62 year old current smoker (1/2 to 2 packs/day (90 PY) who presents for follow-up of c/o and this with associated ILD.  Patient was last seen here on 13 November 2022.  Patient is enrolled in lung cancer screening program.  Has not quit smoking.  No recent exacerbations noted since last visit.  HPI Discussed the use of AI scribe software for clinical note transcription with the patient, who gave verbal consent to proceed.  History of Present Illness   The patient, with a history of respiratory issues, reports no recent exacerbations of shortness of breath. She uses an albuterol inhaler sporadically, with usage increasing during periods of high humidity. She also reports a recent cold, which has resulted in a persistent morning cough. However, she denies any productive cough.  The patient is a current smoker, consuming one to two packs per day, with a noted decrease in smoking when caring for her grandchild. She has not received a flu shot in the past eight to nine years due to a previous adverse reaction resulting in pneumonia.  The patient is also under the care of a pain clinic, though the specifics of this treatment were not discussed in detail. She has been enrolled in a lung cancer screening program since 2022, with the next screening due in January 2025.     DATA: 04/10/2015 PFTs: FEV1 1.62 L or 72% predicted, FVC 2.59 L or 86% predicted, FEV1/FVC 63%.  There is significant bronchodilator response with FEV1 normalizing to 93% for a total of 29% net change.  Findings are consistent with mild COPD with significant  bronchodilator response. 07/29/2021 LDCT:Lung-RADS 2S, benign appearance or behavior. Continue annual screening with low-dose chest CT without contrast in 12 months. 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 10/08/2021 PFTs: FEV1 is 1.95 L or 81% predicted, FVC 2.49 L or 80% predicted, FEV1/FVC 78%.  No bronchodilator response.  Lung volumes normal with exception of ERV which is significantly reduced at 24% consistent with obesity.  Diffusion capacity mildly reduced.  Review of Systems A 10 point review of systems was performed and it is as noted above otherwise negative.   Patient Active Problem List   Diagnosis Date Noted   Toxic encephalopathy 02/25/2023   Polypharmacy 02/25/2023   Chronic back pain 02/25/2023   Chronic obstructive pulmonary disease (COPD) (HCC) 02/25/2023   Drug overdose, accidental or unintentional, sequela 02/25/2023   Opiate use 02/25/2023   Lumbar spondylosis 02/11/2022   Lumbar degenerative disc disease 02/11/2022   Chronic migraine without aura without status migrainosus, not intractable 02/11/2022   Noncompliance with treatment regimen 09/14/2020   Tobacco use disorder 08/14/2020   MDD (major depressive disorder), recurrent episode, moderate (HCC) 07/06/2020   GAD (generalized anxiety disorder) 07/06/2020   RLS (restless legs syndrome) 07/06/2020   Headache disorder 06/01/2020   Loss of memory 08/04/2019   Multiple fractures of ribs, bilateral, subsequent encounter for fracture with routine healing 08/10/2017    Social History   Tobacco Use   Smoking status: Every Day    Current packs/day: 3.00    Average packs/day: 3.0 packs/day for  30.0 years (90.0 ttl pk-yrs)    Types: Cigarettes   Smokeless tobacco: Never   Tobacco comments:    1.5 packs a day, sometimes 2 packs depending on stress- khj 09/17/2023  Substance Use Topics   Alcohol use: No    Allergies  Allergen Reactions   Buspirone     confused   Gabapentin Hives  and Other (See Comments)    Involuntary jerking and hives   Pregabalin Other (See Comments)    Mental changes    Current Meds  Medication Sig   albuterol (VENTOLIN HFA) 108 (90 Base) MCG/ACT inhaler Inhale 2 puffs into the lungs every 6 (six) hours as needed for wheezing or shortness of breath.   amitriptyline (ELAVIL) 100 MG tablet Take 100 mg by mouth at bedtime.   Blood Glucose Monitoring Suppl (TRUE METRIX METER) DEVI SMARTSIG:1 Unit(s) Via Meter Daily   celecoxib (CELEBREX) 200 MG capsule Take 1 capsule (200 mg total) by mouth daily.   Cyanocobalamin (VITAMIN B12 PO) Take 1 tablet by mouth daily.    cyclobenzaprine (FLEXERIL) 10 MG tablet Take 1 tablet by mouth 2 (two) times daily.   Fluticasone-Umeclidin-Vilant (TRELEGY ELLIPTA) 200-62.5-25 MCG/ACT AEPB Inhale 1 puff into the lungs as needed.   HYDROcodone-acetaminophen (NORCO/VICODIN) 5-325 MG tablet Take 1 tablet by mouth 3 (three) times daily as needed.   hydrOXYzine (VISTARIL) 50 MG capsule Take 1 capsule (50 mg total) by mouth at bedtime.   LINZESS 145 MCG CAPS capsule Take 145 mcg by mouth every morning.   meclizine (ANTIVERT) 25 MG tablet Take 25 mg by mouth 3 (three) times daily as needed for dizziness or nausea.    meloxicam (MOBIC) 15 MG tablet Take 15 mg by mouth daily as needed.   metFORMIN (GLUCOPHAGE-XR) 500 MG 24 hr tablet Take 250-500 mg by mouth See admin instructions. Take 500 mg by mouth daily in the morning and may take an additional 250 mg if needed for high blood sugar   metoprolol tartrate (LOPRESSOR) 25 MG tablet Take 25 mg by mouth in the morning and at bedtime.   Omega-3 Fatty Acids (FISH OIL PO) Take 1 capsule by mouth daily.   omeprazole (PRILOSEC) 20 MG capsule Take 20 mg by mouth in the morning.   Pharmacist Choice Lancets MISC Apply topically as needed.    pravastatin (PRAVACHOL) 40 MG tablet Take 40 mg by mouth at bedtime.   rOPINIRole (REQUIP) 0.5 MG tablet TAKE (1) TABLET BY MOUTH DAILY AT BEDTIME FOR  RESTLESS LEGS.   TRUE METRIX BLOOD GLUCOSE TEST test strip SMARTSIG:1 Via Meter 4-5 Times Daily   VITAMIN D PO Take 1 tablet by mouth daily.    vitamin E 180 MG (400 UNITS) capsule Take 400 Units by mouth daily.    Immunization History  Administered Date(s) Administered   [employer] Vaccination 01/25/2020, 02/22/2020   Tdap 07/15/2017, 06/05/2018, 06/10/2019   Zoster Recombinant(Shingrix) 06/10/2019, 09/01/2019        Objective:   BP 110/70 (BP Location: Right Arm, Cuff Size: Normal)   Pulse 99   Temp (!) 97.5 F (36.4 C)   Ht 5\' 3"  (1.6 m)   Wt 177 lb (80.3 kg)   SpO2 96%   BMI 31.35 kg/m   SpO2: 96 % O2 Device: None (Room air)  GENERAL: Obese woman, no acute distress, fully ambulatory.  No conversational dyspnea. HEAD: Normocephalic, atraumatic.  EYES: Pupils equal, round, reactive to light.  No scleral icterus.  MOUTH: Edentulous, oral mucosa moist.  No thrush.  NECK: Supple. No thyromegaly. Trachea midline. No JVD.  No adenopathy. PULMONARY: Good air entry bilaterally.  Coarse, otherwise, no adventitious sounds. CARDIOVASCULAR: S1 and S2. Regular rate and rhythm.  No rubs, murmurs or gallops heard. ABDOMEN: Obese otherwise benign. MUSCULOSKELETAL: No joint deformity, no clubbing, no edema.  NEUROLOGIC: No focal deficit, no gait disturbance, speech is fluent. SKIN: Intact,warm,dry. PSYCH: Flat affect, behavior normal.    Assessment & Plan:     ICD-10-CM   1. Stage 1 mild COPD by GOLD classification (HCC)  J44.9     2. 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 Heavy cigarette smoker (20-39 per day)  F17.210      Smoking cessation instruction/counseling given:  counseled patient on the dangers of tobacco use, advised patient to stop smoking, and reviewed strategies to maximize success.  Patient poorly motivated to quit, reiterated importance of smoking cessation particularly in view of RB ILD.  Discussion:     Chronic Obstructive Pulmonary Disease (COPD) Intermittent dyspnea, primarily during humid weather. Minimal use of albuterol inhaler, no use of maintenance inhaler. Recent URI with residual morning cough, likely exacerbated by smoking. Smoking 1.5-2 packs/day. Advised smoking cessation to improve lung health and reduce CT scan changes. Discussed no medication can reverse smoking-related changes; cessation is the only effective measure. - Encourage smoking cessation - Continue albuterol inhaler PRN - She declines use of maintenance inhalers as prior.  Respiratory Bronchiolitis with Interstitial Lung Disease (smoking-related bronchiolitis - Reiterated to patient that management requires smoking cessation  Lung Cancer Screening Enrolled in annual low-dose CT scan program. Last scan on November 13, 2022; next due November 15, 2023. No issues noted from the last scan. Records need to be accessible to the pain clinic and other relevant providers. - Schedule next low-dose CT scan for January 2025 - Ensure records are accessible to the pain clinic and other relevant providers  General Health Maintenance Avoids flu and pneumonia vaccines due to past adverse reactions. - No flu or pneumonia vaccines due to history of adverse reactions  Follow-up - Follow up in six months - Contact if any issues arise from the lung cancer screening program.      C. Danice Goltz, MD [facility]    *This note was generated using voice recognition software/Dragon and/or AI transcription program.  Despite best efforts to proofread, errors can occur which can change the meaning. Any transcriptional errors that result from this process are unintentional and may not be fully corrected at the time of dictation.

## 2023-10-26 ENCOUNTER — Other Ambulatory Visit: Payer: Self-pay | Admitting: Internal Medicine

## 2023-10-26 DIAGNOSIS — Z1231 Encounter for screening mammogram for malignant neoplasm of breast: Secondary | ICD-10-CM

## 2023-11-16 ENCOUNTER — Ambulatory Visit: Admission: RE | Admit: 2023-11-16 | Payer: 59 | Source: Ambulatory Visit

## 2024-02-04 ENCOUNTER — Encounter

## 2024-03-18 ENCOUNTER — Ambulatory Visit
Admission: RE | Admit: 2024-03-18 | Discharge: 2024-03-18 | Disposition: A | Discharge status: Home / Self Care | Source: Ambulatory Visit | Attending: Internal Medicine | Admitting: Internal Medicine

## 2024-03-18 ENCOUNTER — Encounter

## 2024-03-18 DIAGNOSIS — Z1231 Encounter for screening mammogram for malignant neoplasm of breast: Secondary | ICD-10-CM | POA: Insufficient documentation

## 2024-06-01 ENCOUNTER — Other Ambulatory Visit: Payer: Self-pay | Admitting: Internal Medicine

## 2024-06-01 DIAGNOSIS — M79605 Pain in left leg: Secondary | ICD-10-CM

## 2024-07-04 ENCOUNTER — Other Ambulatory Visit: Payer: Self-pay | Admitting: Sports Medicine

## 2024-07-04 DIAGNOSIS — M19012 Primary osteoarthritis, left shoulder: Secondary | ICD-10-CM

## 2024-07-04 DIAGNOSIS — M7542 Impingement syndrome of left shoulder: Secondary | ICD-10-CM

## 2024-07-04 DIAGNOSIS — M778 Other enthesopathies, not elsewhere classified: Secondary | ICD-10-CM

## 2024-07-04 DIAGNOSIS — M7552 Bursitis of left shoulder: Secondary | ICD-10-CM

## 2024-07-04 DIAGNOSIS — G8929 Other chronic pain: Secondary | ICD-10-CM

## 2024-07-07 ENCOUNTER — Other Ambulatory Visit

## 2024-07-12 ENCOUNTER — Ambulatory Visit
Admission: RE | Admit: 2024-07-12 | Discharge: 2024-07-12 | Disposition: A | Discharge status: Home / Self Care | Source: Ambulatory Visit | Attending: Sports Medicine | Admitting: Sports Medicine

## 2024-07-12 DIAGNOSIS — M19012 Primary osteoarthritis, left shoulder: Secondary | ICD-10-CM | POA: Insufficient documentation

## 2024-07-12 DIAGNOSIS — M7542 Impingement syndrome of left shoulder: Secondary | ICD-10-CM | POA: Insufficient documentation

## 2024-07-12 DIAGNOSIS — G8929 Other chronic pain: Secondary | ICD-10-CM | POA: Diagnosis present

## 2024-07-12 DIAGNOSIS — M25512 Pain in left shoulder: Secondary | ICD-10-CM | POA: Diagnosis present

## 2024-07-12 DIAGNOSIS — M7552 Bursitis of left shoulder: Secondary | ICD-10-CM | POA: Insufficient documentation

## 2024-07-12 DIAGNOSIS — M778 Other enthesopathies, not elsewhere classified: Secondary | ICD-10-CM | POA: Insufficient documentation

## 2024-08-04 ENCOUNTER — Other Ambulatory Visit: Payer: Self-pay | Admitting: Orthopedic Surgery

## 2024-08-10 ENCOUNTER — Encounter: Payer: Self-pay | Admitting: Orthopedic Surgery

## 2024-08-10 DIAGNOSIS — Z8489 Family history of other specified conditions: Secondary | ICD-10-CM

## 2024-08-10 NOTE — Anesthesia Preprocedure Evaluation (Addendum)
 Anesthesia Evaluation  Patient identified by MRN, date of birth, ID band Patient awake    Reviewed: Allergy & Precautions, H&P , NPO status , Patient's Chart, lab work & pertinent test results  History of Anesthesia Complications (+) Family history of anesthesia reaction  Airway Mallampati: I  TM Distance: >3 FB Neck ROM: Full    Dental no notable dental hx. (+) Edentulous Upper, Edentulous Lower   Pulmonary neg pulmonary ROS, shortness of breath, asthma , sleep apnea , pneumonia, COPD, Current Smoker 11-13-22 CT chest IMPRESSION: 1. Lung-RADS 2, benign appearance or behavior. Continue annual screening with low-dose chest CT without contrast in 12 months. 2. Diffuse bronchial wall thickening with emphysema, as above; imaging findings suggestive of underlying COPD. 3. Coronary artery calcifications. 4. Aortic Atherosclerosis (ICD10-I70.0) and Emphysema (ICD10-J43.9).    breath sounds clear to auscultation + decreased breath sounds      Cardiovascular hypertension, + CAD  negative cardio ROS Normal cardiovascular exam+ dysrhythmias  Rhythm:Regular Rate:Normal     Neuro/Psych  Headaches PSYCHIATRIC DISORDERS Anxiety Depression Bipolar Disorder   04-12-24 neuro notes: he is also under the care of a pain clinic, where she has been prescribed 5 mg of a medication that she feels is not sufficiently effective.  She attributes past memory issues to a previous medication regimen that included Effexor  and Depakote , which she believes caused significant cognitive and emotional side effects. Her memory test score is stable at 22 out of 23, showing slight improvement from previous evaluation. She reports improvement in memory since discontinuing Effexor  and Depakote .  She is currently not seeing a psychiatrist due to transportation issues and is on a waitlist for an appointment. She is experiencing frustration with the current situation and  the impact of her personal losses, including the death of her boyfriend in 11/12/2024 and her son in March, as well as losing custody of her grandchild, on her mental health.   Neuromuscular disease negative neurological ROS  negative psych ROS   GI/Hepatic negative GI ROS, Neg liver ROS,GERD  ,,(+) Hepatitis -  Endo/Other  negative endocrine ROSdiabetes    Renal/GU Renal diseasenegative Renal ROS  negative genitourinary   Musculoskeletal negative musculoskeletal ROS (+) Arthritis ,    Abdominal   Peds negative pediatric ROS (+)  Hematology negative hematology ROS (+)   Anesthesia Other Findings Takes daily ASA and celebrex  for pain control  On day of surgery, discussed possible interscalene block with patient  Pneumonia  COPD (chronic obstructive pulmonary disease) Gold I classification Can walk up one flight steps but is dyspneic when she does  Asthma  Dyspnea Headache  Arthritis Back pain  Kidney tumor (benign), left Bell's palsy 12 years ago, right eye weaker  Snake bite Diabetes mellitus, type 2 (HCC)  Hypertension Hepatitis C  Family history of adverse reaction to anesthesia GERD (gastroesophageal reflux disease Sleep apnea, awaiting CPAP Dysrhythmia  Bipolar disorder (HCC) Anxiety      Reproductive/Obstetrics negative OB ROS                              Anesthesia Physical Anesthesia Plan  ASA: 3  Anesthesia Plan: General ETT   Post-op Pain Management:    Induction: Intravenous, Cricoid pressure planned and Rapid sequence  PONV Risk Score and Plan:   Airway Management Planned: Oral ETT  Additional Equipment:   Intra-op Plan:   Post-operative Plan: Extubation in OR  Informed Consent: I have reviewed the patients History  and Physical, chart, labs and discussed the procedure including the risks, benefits and alternatives for the proposed anesthesia with the patient or authorized representative who has indicated  his/her understanding and acceptance.     Dental Advisory Given  Plan Discussed with: Anesthesiologist, CRNA and Surgeon  Anesthesia Plan Comments: (Patient consented for risks of anesthesia including but not limited to:  - adverse reactions to medications - damage to eyes, teeth, lips or other oral mucosa - nerve damage due to positioning  - sore throat or hoarseness - Damage to heart, brain, nerves, lungs, other parts of body or loss of life  Patient voiced understanding and assent.)         Anesthesia Quick Evaluation

## 2024-08-11 ENCOUNTER — Telehealth: Payer: Self-pay | Admitting: Pulmonary Disease

## 2024-08-11 NOTE — Telephone Encounter (Signed)
 Pt has a surgical clearance faxed to us  today. Last seen 09/2023. Called pt to schedule an appointment and was told they take medical transportation. No way to get here before procedure. Caregiver states all other appointments have been good. See form in folder.

## 2024-08-12 NOTE — Telephone Encounter (Signed)
 Clearance was signed and faxed back.  Nothing further needed.

## 2024-08-15 ENCOUNTER — Ambulatory Visit: Payer: Self-pay | Admitting: Anesthesiology

## 2024-08-15 ENCOUNTER — Other Ambulatory Visit: Payer: Self-pay

## 2024-08-15 ENCOUNTER — Encounter
Admission: RE | Disposition: A | Discharge status: Home / Self Care | Payer: Self-pay | Source: Home / Self Care | Attending: Orthopedic Surgery

## 2024-08-15 ENCOUNTER — Encounter: Payer: Self-pay | Admitting: Orthopedic Surgery

## 2024-08-15 ENCOUNTER — Ambulatory Visit
Admission: RE | Admit: 2024-08-15 | Discharge: 2024-08-15 | Disposition: A | Discharge status: Home / Self Care | Attending: Orthopedic Surgery | Admitting: Orthopedic Surgery

## 2024-08-15 DIAGNOSIS — M19012 Primary osteoarthritis, left shoulder: Secondary | ICD-10-CM | POA: Diagnosis not present

## 2024-08-15 DIAGNOSIS — W19XXXA Unspecified fall, initial encounter: Secondary | ICD-10-CM | POA: Insufficient documentation

## 2024-08-15 DIAGNOSIS — S46012A Strain of muscle(s) and tendon(s) of the rotator cuff of left shoulder, initial encounter: Secondary | ICD-10-CM | POA: Insufficient documentation

## 2024-08-15 DIAGNOSIS — M24012 Loose body in left shoulder: Secondary | ICD-10-CM | POA: Diagnosis not present

## 2024-08-15 DIAGNOSIS — M25812 Other specified joint disorders, left shoulder: Secondary | ICD-10-CM | POA: Insufficient documentation

## 2024-08-15 DIAGNOSIS — Z8489 Family history of other specified conditions: Secondary | ICD-10-CM

## 2024-08-15 HISTORY — DX: Type 2 diabetes mellitus with diabetic neuropathy, unspecified: E11.40

## 2024-08-15 HISTORY — PX: SUBACROMIAL DECOMPRESSION: SHX5174

## 2024-08-15 HISTORY — DX: Bipolar disorder, unspecified: F31.9

## 2024-08-15 HISTORY — DX: Cardiac arrhythmia, unspecified: I49.9

## 2024-08-15 HISTORY — PX: POSTERIOR LUMBAR FUSION 2 WITH HARDWARE REMOVAL: SHX7297

## 2024-08-15 HISTORY — DX: Chronic obstructive pulmonary disease, unspecified: J44.9

## 2024-08-15 HISTORY — DX: Anxiety disorder, unspecified: F41.9

## 2024-08-15 HISTORY — PX: ARTHOSCOPIC ROTAOR CUFF REPAIR: SHX5002

## 2024-08-15 HISTORY — DX: Nicotine dependence, cigarettes, uncomplicated: F17.210

## 2024-08-15 HISTORY — DX: Atherosclerosis of aorta: I70.0

## 2024-08-15 HISTORY — DX: Respiratory bronchiolitis interstitial lung disease: J84.115

## 2024-08-15 HISTORY — DX: Generalized anxiety disorder: F41.1

## 2024-08-15 HISTORY — DX: Restless legs syndrome: G25.81

## 2024-08-15 HISTORY — DX: Complete loss of teeth, unspecified cause, unspecified class: K08.109

## 2024-08-15 HISTORY — DX: Atherosclerotic heart disease of native coronary artery without angina pectoris: I25.10

## 2024-08-15 LAB — GLUCOSE, CAPILLARY: Glucose-Capillary: 86 mg/dL (ref 70–99)

## 2024-08-15 SURGERY — REPAIR, ROTATOR CUFF, ARTHROSCOPIC
Anesthesia: General | Site: Shoulder | Laterality: Left

## 2024-08-15 MED ORDER — BUPIVACAINE LIPOSOME 1.3 % IJ SUSP
INTRAMUSCULAR | Status: AC
  Filled 2024-08-15: qty 10

## 2024-08-15 MED ORDER — FENTANYL CITRATE (PF) 100 MCG/2ML IJ SOLN
INTRAMUSCULAR | Status: AC
  Filled 2024-08-15: qty 2

## 2024-08-15 MED ORDER — ACETAMINOPHEN 500 MG PO TABS: 1000.0000 mg | ORAL_TABLET | Freq: Three times a day (TID) | ORAL | 2 refills | Status: AC

## 2024-08-15 MED ORDER — ACETAMINOPHEN 10 MG/ML IV SOLN
INTRAVENOUS | Status: DC | PRN
  Administered 2024-08-15: 1000 mg via INTRAVENOUS

## 2024-08-15 MED ORDER — ASPIRIN 325 MG PO TBEC: 325.0000 mg | DELAYED_RELEASE_TABLET | Freq: Every day | ORAL | 0 refills | Status: AC

## 2024-08-15 MED ORDER — BUPIVACAINE HCL (PF) 0.5 % IJ SOLN
INTRAMUSCULAR | Status: DC | PRN
  Administered 2024-08-15: 9 mL

## 2024-08-15 MED ORDER — FENTANYL CITRATE PF 50 MCG/ML IJ SOSY: 50.0000 ug | PREFILLED_SYRINGE | INTRAMUSCULAR | Status: DC | PRN

## 2024-08-15 MED ORDER — KETAMINE HCL 10 MG/ML IJ SOLN
INTRAMUSCULAR | Status: DC | PRN
  Administered 2024-08-15: 10 mg via INTRAVENOUS
  Administered 2024-08-15 (×2): 20 mg via INTRAVENOUS

## 2024-08-15 MED ORDER — SUCCINYLCHOLINE CHLORIDE 200 MG/10ML IV SOSY
PREFILLED_SYRINGE | INTRAVENOUS | Status: AC
  Filled 2024-08-15: qty 10

## 2024-08-15 MED ORDER — SEVOFLURANE IN SOLN
RESPIRATORY_TRACT | Status: AC
  Filled 2024-08-15: qty 250

## 2024-08-15 MED ORDER — HYDROMORPHONE HCL 1 MG/ML IJ SOLN
INTRAMUSCULAR | Status: AC
  Filled 2024-08-15: qty 0.5

## 2024-08-15 MED ORDER — ROCURONIUM BROMIDE 10 MG/ML (PF) SYRINGE
PREFILLED_SYRINGE | INTRAVENOUS | Status: AC
  Filled 2024-08-15: qty 10

## 2024-08-15 MED ORDER — DEXAMETHASONE SODIUM PHOSPHATE 4 MG/ML IJ SOLN
INTRAMUSCULAR | Status: DC | PRN
  Administered 2024-08-15: 4 mg via INTRAVENOUS

## 2024-08-15 MED ORDER — ONDANSETRON 4 MG PO TBDP: 4.0000 mg | ORAL_TABLET | Freq: Three times a day (TID) | ORAL | 0 refills | Status: AC | PRN

## 2024-08-15 MED ORDER — LACTATED RINGERS IR SOLN
Status: DC | PRN
  Administered 2024-08-15 (×3): 6000 mL
  Administered 2024-08-15: 12000 mL
  Administered 2024-08-15: 3000 mL
  Administered 2024-08-15: 6000 mL

## 2024-08-15 MED ORDER — ONDANSETRON HCL 4 MG/2ML IJ SOLN
INTRAMUSCULAR | Status: AC
  Filled 2024-08-15: qty 2

## 2024-08-15 MED ORDER — LACTATED RINGERS IV SOLN: INTRAVENOUS | Status: DC

## 2024-08-15 MED ORDER — BUPIVACAINE HCL (PF) 0.5 % IJ SOLN
INTRAMUSCULAR | Status: AC
  Filled 2024-08-15: qty 10

## 2024-08-15 MED ORDER — DEXMEDETOMIDINE HCL IN NACL 200 MCG/50ML IV SOLN
INTRAVENOUS | Status: DC | PRN
Start: 2024-08-15 — End: 2024-08-15
  Administered 2024-08-15: 6 ug via INTRAVENOUS
  Administered 2024-08-15: 8 ug via INTRAVENOUS

## 2024-08-15 MED ORDER — EPHEDRINE SULFATE (PRESSORS) 50 MG/ML IJ SOLN
INTRAMUSCULAR | Status: DC | PRN
  Administered 2024-08-15: 5 mg via INTRAVENOUS

## 2024-08-15 MED ORDER — DEXAMETHASONE SODIUM PHOSPHATE 4 MG/ML IJ SOLN
INTRAMUSCULAR | Status: AC
  Filled 2024-08-15: qty 1

## 2024-08-15 MED ORDER — PHENYLEPHRINE 80 MCG/ML (10ML) SYRINGE FOR IV PUSH (FOR BLOOD PRESSURE SUPPORT)
PREFILLED_SYRINGE | INTRAVENOUS | Status: AC
  Filled 2024-08-15: qty 10

## 2024-08-15 MED ORDER — FENTANYL CITRATE (PF) 100 MCG/2ML IJ SOLN
100.0000 ug | Freq: Once | INTRAMUSCULAR | Status: AC
  Administered 2024-08-15: 100 ug via INTRAVENOUS

## 2024-08-15 MED ORDER — BUPIVACAINE LIPOSOME 1.3 % IJ SUSP
INTRAMUSCULAR | Status: DC | PRN
  Administered 2024-08-15: 9 mL

## 2024-08-15 MED ORDER — CEFAZOLIN SODIUM-DEXTROSE 2-4 GM/100ML-% IV SOLN
2.0000 g | INTRAVENOUS | Status: AC
  Administered 2024-08-15: 2 g via INTRAVENOUS

## 2024-08-15 MED ORDER — SUCCINYLCHOLINE CHLORIDE 200 MG/10ML IV SOSY
PREFILLED_SYRINGE | INTRAVENOUS | Status: DC | PRN
  Administered 2024-08-15: 140 mg via INTRAVENOUS

## 2024-08-15 MED ORDER — ACETAMINOPHEN 10 MG/ML IV SOLN
INTRAVENOUS | Status: AC
Start: 2024-08-15 — End: 2024-08-15
  Filled 2024-08-15: qty 100

## 2024-08-15 MED ORDER — KETAMINE HCL 50 MG/ML IJ SOLN
INTRAMUSCULAR | Status: AC
  Filled 2024-08-15: qty 1

## 2024-08-15 MED ORDER — MIDAZOLAM HCL 2 MG/2ML IJ SOLN
INTRAMUSCULAR | Status: AC
  Filled 2024-08-15: qty 2

## 2024-08-15 MED ORDER — LIDOCAINE HCL (CARDIAC) PF 100 MG/5ML IV SOSY
PREFILLED_SYRINGE | INTRAVENOUS | Status: DC | PRN
  Administered 2024-08-15: 100 mg via INTRAVENOUS

## 2024-08-15 MED ORDER — MIDAZOLAM HCL 2 MG/2ML IJ SOLN
2.0000 mg | Freq: Once | INTRAMUSCULAR | Status: AC
  Administered 2024-08-15: 2 mg via INTRAVENOUS

## 2024-08-15 MED ORDER — HYDROMORPHONE HCL 1 MG/ML IJ SOLN
0.5000 mg | INTRAMUSCULAR | Status: DC | PRN
  Administered 2024-08-15 (×2): .5 mg via INTRAVENOUS

## 2024-08-15 MED ORDER — LACTATED RINGERS IV SOLN
INTRAVENOUS | Status: DC | PRN
  Administered 2024-08-15: 4 mL

## 2024-08-15 MED ORDER — CEFAZOLIN SODIUM-DEXTROSE 2-3 GM-%(50ML) IV SOLR
INTRAVENOUS | Status: AC
  Filled 2024-08-15: qty 50

## 2024-08-15 MED ORDER — PROPOFOL 10 MG/ML IV BOLUS
INTRAVENOUS | Status: DC | PRN
  Administered 2024-08-15: 150 mg via INTRAVENOUS

## 2024-08-15 MED ORDER — DEXMEDETOMIDINE HCL IN NACL 80 MCG/20ML IV SOLN
INTRAVENOUS | Status: AC
  Filled 2024-08-15: qty 20

## 2024-08-15 MED ORDER — ROCURONIUM BROMIDE 100 MG/10ML IV SOLN
INTRAVENOUS | Status: DC | PRN
  Administered 2024-08-15: 5 mg via INTRAVENOUS

## 2024-08-15 MED ORDER — ONDANSETRON HCL 4 MG/2ML IJ SOLN
INTRAMUSCULAR | Status: DC | PRN
Start: 2024-08-15 — End: 2024-08-15
  Administered 2024-08-15: 4 mg via INTRAVENOUS

## 2024-08-15 MED ORDER — OXYCODONE HCL 5 MG PO TABS: 5.0000 mg | ORAL_TABLET | ORAL | 0 refills | Status: AC | PRN

## 2024-08-15 MED ORDER — PHENYLEPHRINE HCL (PRESSORS) 10 MG/ML IV SOLN
INTRAVENOUS | Status: DC | PRN
  Administered 2024-08-15 (×2): 80 ug via INTRAVENOUS

## 2024-08-15 MED ORDER — EPHEDRINE 5 MG/ML INJ
INTRAVENOUS | Status: AC
Start: 2024-08-15 — End: 2024-08-15
  Filled 2024-08-15: qty 5

## 2024-08-15 MED ORDER — LIDOCAINE HCL (PF) 2 % IJ SOLN
INTRAMUSCULAR | Status: AC
  Filled 2024-08-15: qty 5

## 2024-08-15 MED ORDER — OXYCODONE HCL 5 MG PO TABS: 10.0000 mg | ORAL_TABLET | Freq: Once | ORAL | Status: DC

## 2024-08-15 SURGICAL SUPPLY — 44 items
ANCH SUT ALPHAVENT 4.75 1.4 XB (Anchor) IMPLANT
ANCHOR SWIVELOCK BIO 4.75X19.1 (Anchor) IMPLANT
ANCHOR SWIVELOCK SP KL 4.75 (Anchor) IMPLANT
BLADE SHAVER 4.5X7 STR FR (MISCELLANEOUS) ×1 IMPLANT
BUR BR 5.5 WIDE MOUTH (BURR) ×1 IMPLANT
CANNULA PART THRD DISP 5.75X7 (CANNULA) ×1 IMPLANT
CANNULA PARTIAL THREAD 2X7 (CANNULA) ×1 IMPLANT
CANNULA TWIST IN 8.25X7CM (CANNULA) IMPLANT
CHLORAPREP W/TINT 26 (MISCELLANEOUS) ×1 IMPLANT
COOLER ICEMAN CLASSIC (MISCELLANEOUS) ×1 IMPLANT
COVER LIGHT HANDLE UNIVERSAL (MISCELLANEOUS) ×3 IMPLANT
CRADLE LAMINECT ARM (MISCELLANEOUS) ×1 IMPLANT
DERMABOND ADVANCED .7 DNX12 (GAUZE/BANDAGES/DRESSINGS) ×1 IMPLANT
DEVICE SUCT BLK HOLE OR FLOOR (MISCELLANEOUS) IMPLANT
DRAPE STERI 35X30 U-POUCH (DRAPES) ×1 IMPLANT
DRAPE U-SHAPE 48X52 POLY STRL (PACKS) ×1 IMPLANT
DRSG TEGADERM 4X4.75 (GAUZE/BANDAGES/DRESSINGS) ×3 IMPLANT
ELECTRODE REM PT RTRN 9FT ADLT (ELECTROSURGICAL) ×1 IMPLANT
GAUZE SPONGE 4X4 12PLY STRL (GAUZE/BANDAGES/DRESSINGS) ×1 IMPLANT
GAUZE XEROFORM 1X8 LF (GAUZE/BANDAGES/DRESSINGS) ×1 IMPLANT
GLOVE BIOGEL PI IND STRL 8 (GLOVE) ×2 IMPLANT
GLOVE SURG SS PI 7.5 STRL IVOR (GLOVE) ×3 IMPLANT
GLOVE SURG SYN 8.0 PF PI (GLOVE) ×1 IMPLANT
GOWN STRL REIN 2XL XLG LVL4 (GOWN DISPOSABLE) ×1 IMPLANT
GOWN STRL REUS W/ TWL LRG LVL3 (GOWN DISPOSABLE) ×3 IMPLANT
IV LR IRRIG 3000ML ARTHROMATIC (IV SOLUTION) ×4 IMPLANT
KIT STABILIZATION SHOULDER (MISCELLANEOUS) ×1 IMPLANT
KIT TURNOVER KIT A (KITS) ×1 IMPLANT
MANIFOLD NEPTUNE II (INSTRUMENTS) ×1 IMPLANT
MASK FACE SPIDER DISP (MASK) ×1 IMPLANT
MAT ABSORB FLUID 56X50 GRAY (MISCELLANEOUS) ×2 IMPLANT
PACK ARTHROSCOPY SHOULDER (MISCELLANEOUS) ×1 IMPLANT
PAD ABD DERMACEA PRESS 5X9 (GAUZE/BANDAGES/DRESSINGS) ×2 IMPLANT
PAD WRAPON POLAR SHDR XLG (MISCELLANEOUS) ×1 IMPLANT
PASSER SUT FIRSTPASS SELF (INSTRUMENTS) IMPLANT
SET Y ADAPTER MULIT-BAG IRRIG (MISCELLANEOUS) ×2 IMPLANT
SUT PDS AB 0 CT1 27 (SUTURE) IMPLANT
SUT VIC AB 0 CT1 36 (SUTURE) ×1 IMPLANT
SUTURE EHLN 3-0 FS-10 30 BLK (SUTURE) IMPLANT
SYSTEM IMPL TENODESIS LNT 2.9 (Orthopedic Implant) IMPLANT
TUBE SET DOUBLEFLO INFLOW (TUBING) ×1 IMPLANT
TUBING CONNECTING 10 (TUBING) ×1 IMPLANT
TUBING OUTFLOW SET DBLFO PUMP (TUBING) ×1 IMPLANT
WAND WEREWOLF FLOW 90D (MISCELLANEOUS) ×1 IMPLANT

## 2024-08-15 NOTE — Anesthesia Postprocedure Evaluation (Signed)
 Anesthesia Post Note  Patient: Sally Hughes  Procedure(s) Performed: REPAIR, ROTATOR CUFF, ARTHROSCOPIC (Left: Shoulder) DECOMPRESSION, SUBACROMIAL SPACE (Left: Shoulder) ARTHROSCOPY, SHOULDER WITH DEBRIDEMENT (Left: Shoulder)  Patient location during evaluation: PACU Anesthesia Type: General Level of consciousness: awake and alert Pain management: pain level controlled Vital Signs Assessment: post-procedure vital signs reviewed and stable Respiratory status: spontaneous breathing, nonlabored ventilation, respiratory function stable and patient connected to nasal cannula oxygen Cardiovascular status: blood pressure returned to baseline and stable Postop Assessment: no apparent nausea or vomiting Anesthetic complications: no   No notable events documented.   Last Vitals:  Vitals:   08/15/24 1605 08/15/24 1606  BP:    Pulse: 95 92  Resp: 18 14  Temp:    SpO2: 94% 93%    Last Pain:  Vitals:   08/15/24 1600  TempSrc:   PainSc: 0-No pain                 Edouard Gikas C Diron Haddon

## 2024-08-15 NOTE — Discharge Instructions (Addendum)
 Post-Op Instructions - Rotator Cuff Repair  1. Bracing: You will wear a shoulder immobilizer or sling for 6 weeks.   2. Driving: No driving for 3 weeks post-op. When driving, do not wear the immobilizer. Ideally, we recommend no driving for 6 weeks while sling is in place as one arm will be immobilized.   3. Activity: No active lifting for 2 months. Wrist, hand, and elbow motion only. Avoid lifting the upper arm away from the body except for hygiene. You are permitted to bend and straighten the elbow passively only (no active elbow motion). You may use your hand and wrist for typing, writing, and managing utensils (cutting food). Do not lift more than a coffee cup for 8 weeks.  When sleeping or resting, inclined positions (recliner chair or wedge pillow) and a pillow under the forearm for support may provide better comfort for up to 4 weeks.  Avoid long distance travel for 4 weeks.  Return to normal activities after rotator cuff repair repair normally takes 6 months on average. If rehab goes very well, may be able to do most activities at 4 months, except overhead or contact sports.  4. Physical Therapy: Begins 3-4 days after surgery, and proceed 1 time per week for the first 6 weeks, then 1-2 times per week from weeks 6-20 post-op.  5. Medications:  - You will be provided a prescription for narcotic pain medicine. After surgery, take 1-2 narcotic tablets every 4 hours if needed for severe pain.  - A prescription for anti-nausea medication will be provided in case the narcotic medicine causes nausea - take 1 tablet every 6 hours only if nauseated.   - Take tylenol  1000 mg (2 Extra Strength tablets or 3 regular strength) every 8 hours for pain.  May decrease or stop tylenol  5 days after surgery if you are having minimal pain. - Take ASA 325mg /day x 2 weeks to help prevent DVTs/PEs (blood clots).  - DO NOT take ANY nonsteroidal anti-inflammatory pain medications (Advil , Motrin , Ibuprofen , Aleve ,  Naproxen , or Naprosyn ). These medicines can inhibit healing of your shoulder repair.    If you are taking prescription medication for anxiety, depression, insomnia, muscle spasm, chronic pain, or for attention deficit disorder, you are advised that you are at a higher risk of adverse effects with use of narcotics post-op, including narcotic addiction/dependence, depressed breathing, death. If you use non-prescribed substances: alcohol, marijuana, cocaine, heroin, methamphetamines, etc., you are at a higher risk of adverse effects with use of narcotics post-op, including narcotic addiction/dependence, depressed breathing, death. You are advised that taking > 50 morphine  milligram equivalents (MME) of narcotic pain medication per day results in twice the risk of overdose or death. For your prescription provided: oxycodone  5 mg - taking more than 6 tablets per day would result in > 50 morphine  milligram equivalents (MME) of narcotic pain medication. Be advised that we will prescribe narcotics short-term, for acute post-operative pain only - 3 weeks for major operations such as shoulder repair/reconstruction surgeries.     6. Post-Op Appointment:  Your first post-op appointment will be 10-14 days post-op.  7. Work or School: For most, but not all procedures, we advise staying out of work or school for at least 1 to 2 weeks in order to recover from the stress of surgery and to allow time for healing.   If you need a work or school note this can be provided.   8. Smoking: If you are a smoker, you need to refrain from  smoking in the postoperative period. The nicotine  in cigarettes will inhibit healing of your shoulder repair and decrease the chance of successful repair. Similarly, nicotine  containing products (gum, patches) should be avoided.   Post-operative Brace: Apply and remove the brace you received as you were instructed to at the time of fitting and as described in detail as the brace's  instructions for use indicate.  Wear the brace for the period of time prescribed by your physician.  The brace can be cleaned with soap and water and allowed to air dry only.  Should the brace result in increased pain, decreased feeling (numbness/tingling), increased swelling or an overall worsening of your medical condition, please contact your doctor immediately.  If an emergency situation occurs as a result of wearing the brace after normal business hours, please dial 911 and seek immediate medical attention.  Let your doctor know if you have any further questions about the brace issued to you. Refer to the shoulder sling instructions for use if you have any questions regarding the correct fit of your shoulder sling.  [facility] Customer Care for Troubleshooting: [phone]  Video that illustrates how to properly use a shoulder sling: Instructions for Proper Use of an Orthopaedic Sling http://bass.com/  Information for Discharge Teaching: EXPAREL  (bupivacaine  liposome injectable suspension)   Pain relief is important to your recovery. The goal is to control your pain so you can move easier and return to your normal activities as soon as possible after your procedure. Your physician may use several types of medicines to manage pain, swelling, and more.  Your surgeon or anesthesiologist gave you EXPAREL (bupivacaine ) to help control your pain after surgery.  EXPAREL  is a local anesthetic designed to release slowly over an extended period of time to provide pain relief by numbing the tissue around the surgical site. EXPAREL  is designed to release pain medication over time and can control pain for up to 72 hours. Depending on how you respond to EXPAREL , you may require less pain medication during your recovery. EXPAREL  can help reduce or eliminate the need for opioids during the first few days after surgery when pain relief is needed the most. EXPAREL  is not an opioid and is  not addictive. It does not cause sleepiness or sedation.   Important! A teal colored band has been placed on your arm with the date, time and amount of EXPAREL  you have received. Please leave this armband in place for the full 96 hours following administration, and then you may remove the band. If you return to the hospital for any reason within 96 hours following the administration of EXPAREL , the armband provides important information that your health care providers to know, and alerts them that you have received this anesthetic.    Possible side effects of EXPAREL : Temporary loss of sensation or ability to move in the area where medication was injected. Nausea, vomiting, constipation Rarely, numbness and tingling in your mouth or lips, lightheadedness, or anxiety may occur. Call your doctor right away if you think you may be experiencing any of these sensations, or if you have other questions regarding possible side effects.  Follow all other discharge instructions given to you by your surgeon or nurse. Eat a healthy diet and drink plenty of water or other fluids.Information for Discharge Teaching: EXPAREL  (bupivacaine  liposome injectable suspension)   Pain relief is important to your recovery. The goal is to control your pain so you can move easier and return to your normal activities as soon as possible  after your procedure. Your physician may use several types of medicines to manage pain, swelling, and more.  Your surgeon or anesthesiologist gave you EXPAREL (bupivacaine ) to help control your pain after surgery.  EXPAREL  is a local anesthetic designed to release slowly over an extended period of time to provide pain relief by numbing the tissue around the surgical site. EXPAREL  is designed to release pain medication over time and can control pain for up to 72 hours. Depending on how you respond to EXPAREL , you may require less pain medication during your recovery. EXPAREL  can help reduce or  eliminate the need for opioids during the first few days after surgery when pain relief is needed the most. EXPAREL  is not an opioid and is not addictive. It does not cause sleepiness or sedation.   Important! A teal colored band has been placed on your arm with the date, time and amount of EXPAREL  you have received. Please leave this armband in place for the full 96 hours following administration, and then you may remove the band. If you return to the hospital for any reason within 96 hours following the administration of EXPAREL , the armband provides important information that your health care providers to know, and alerts them that you have received this anesthetic.    Possible side effects of EXPAREL : Temporary loss of sensation or ability to move in the area where medication was injected. Nausea, vomiting, constipation Rarely, numbness and tingling in your mouth or lips, lightheadedness, or anxiety may occur. Call your doctor right away if you think you may be experiencing any of these sensations, or if you have other questions regarding possible side effects.  Follow all other discharge instructions given to you by your surgeon or nurse. Eat a healthy diet and drink plenty of water or other fluids.   POLAR CARE INFORMATION  MassAdvertisement.it  How to use [facility] Polar Care [facility] Therapy System?  YouTube   ShippingScam.co.uk  OPERATING INSTRUCTIONS  Start the product With dry hands, connect the transformer to the electrical connection located on the top of the cooler. Next, plug the transformer into an appropriate electrical outlet. The unit will automatically start running at this point.  To stop the pump, disconnect electrical power.  Unplug to stop the product when not in use. Unplugging the Polar Care unit turns it off. Always unplug immediately after use. Never leave it plugged in while unattended. Remove pad.    FIRST ADD WATER TO FILL LINE, THEN  ICE---Replace ice when existing ice is almost melted  1 Discuss Treatment with your Licensed Health Care Practitioner and Use Only as Prescribed 2 Apply Insulation Barrier & Cold Therapy Pad 3 Check for Moisture 4 Inspect Skin Regularly  Tips and Trouble Shooting Usage Tips 1. Use cubed or chunked ice for optimal performance. 2. It is recommended to drain the Pad between uses. To drain the pad, hold the Pad upright with the hose pointed toward the ground. Depress the black plunger and allow water to drain out. 3. You may disconnect the Pad from the unit without removing the pad from the affected area by depressing the silver tabs on the hose coupling and gently pulling the hoses apart. The Pad and unit will seal itself and will not leak. Note: Some dripping during release is normal. 4. DO NOT RUN PUMP WITHOUT WATER! The pump in this unit is designed to run with water. Running the unit without water will cause permanent damage to the pump. 5. Unplug unit before removing  lid.  TROUBLESHOOTING GUIDE Pump not running, Water not flowing to the pad, Pad is not getting cold 1. Make sure the transformer is plugged into the wall outlet. 2. Confirm that the ice and water are filled to the indicated levels. 3. Make sure there are no kinks in the pad. 4. Gently pull on the blue tube to make sure the tube/pad junction is straight. 5. Remove the pad from the treatment site and ll it while the pad is lying at; then reapply. 6. Confirm that the pad couplings are securely attached to the unit. Listen for the double clicks (Figure 1) to confirm the pad couplings are securely attached.  Leaks    Note: Some condensation on the lines, controller, and pads is unavoidable, especially in warmer climates. 1. If using a [employer] Polar Care Cold Therapy unit with a detachable Cold Therapy Pad, and a leak exists (other than condensation on the lines) disconnect the pad couplings. Make sure the silver tabs on the couplings  are depressed before reconnecting the pad to the pump hose; then confirm both sides of the coupling are properly clicked in. 2. If the coupling continues to leak or a leak is detected in the pad itself, stop using it and call [employer] Customer Care at [phone].  Cleaning After use, empty and dry the unit with a soft cloth. Warm water and mild detergent may be used occasionally to clean the pump and tubes.  WARNING: The Polar Care Cube can be cold enough to cause serious injury, including full skin necrosis. Follow these Operating Instructions, and carefully read the Product Insert (see pouch on side of unit) and the Cold Therapy Pad Fitting Instructions (provided with each Cold Therapy Pad) prior to use.

## 2024-08-15 NOTE — Op Note (Signed)
 SURGERY DATE: 08/15/2024    PRE-OP DIAGNOSIS:  1. Left rotator cuff tear 2. Left subacromial impingement 3. Left biceps tendinopathy 4. Left acromioclavicular joint arthritis   POST-OP DIAGNOSIS: 1. Left rotator cuff tear 2. Left subacromial impingement 3. Left biceps tendinopathy 4. Left acromioclavicular joint arthritis 5.  Left shoulder loose bodies   PROCEDURES:  1. Left arthroscopic rotator cuff repair (supraspinatus musculotendinous junction + full thickness subscapularis) 2. Left arthroscopic biceps tenodesis 3. Left arthroscopic extensive debridement of shoulder (glenohumeral and subacromial spaces) 4. Left arthroscopic subacromial decompression 5. Left arthroscopic distal clavicle excision 6.  Left shoulder arthroscopic loose body removal   SURGEON: Earnestine HILARIO Blanch, MD   ASSISTANT: none   ANESTHESIA: Gen    ESTIMATED BLOOD LOSS: minimal   DRAINS:  none   TOTAL IV FLUIDS: per anesthesia      SPECIMENS: none   IMPLANTS: - Arthrex 4.58mm SwiveLock x 2 - Arthrex 2.43mm PushLock anchor x 1 - Stryker Alphavent triple loaded 4.45mm anchor x 1     OPERATIVE FINDINGS:  Examination under anesthesia: A careful examination under anesthesia was performed.  Passive range of motion was: FF: 150; ER at side: 60; ER in abduction: 95; IR in abduction: 50.  Anterior load shift: NT.  Posterior load shift: NT.  Sulcus in neutral: NT.  Sulcus in ER: NT.     Intra-operative findings: A thorough arthroscopic examination of the shoulder was performed.  The findings are: 1. Biceps tendon: significant tendinopathy with erythema throughout the intra-articular portion 2. Superior labrum: injected with surrounding synovitis 3. Posterior labrum and capsule: normal 4. Inferior capsule and inferior recess: 3 large cartilaginous loose bodies, each measuring approximately 10 x 8 mm 5. Glenoid cartilage surface: Normal except for grade 2 changes about the superior labrum 6. Supraspinatus  attachment: full-thickness tear at the musculotendinous junction 7. Posterior rotator cuff attachment: Mild degenerative fraying 8. Humeral head articular cartilage: Focal grade 4 chondral defect posteriorly measuring approximately 15 x 15 mm with surrounding grade 2-3 degenerative changes 9. Rotator interval: Severe synovitis 10: Subscapularis tendon: Full-thickness tear without significant retraction 11. Anterior labrum: degenerative 12. IGHL: normal   OPERATIVE REPORT:    Indications for procedure:  Sally Hughes is a 63 y.o. female with an approximately 3-4 months of shoulder pain after a fall.  She has had difficulty lifting her arm over his head since that time. Clinical exam and MRI were suggestive of acute, full-thickness rotator cuff tear; subacromial impingement; AC joint arthritis; and biceps tendinopathy. Given these findings, we decided to proceed with surgical management. After discussion of risks, benefits, and alternatives to surgery, the patient elected to proceed.    Procedure in detail:   I identified Sally Hughes in the pre-operative holding area.  I marked the operative shoulder with my initials. I reviewed the risks and benefits of the proposed surgical intervention, and the patient (and/or patient's guardian) wished to proceed.  Patient declined Exparel  interscalene block.  The patient was transferred to the operative suite and placed in the beach chair position.     SCDs were placed on the lower extremities. Appropriate IV antibiotics were administered prior to incision. The operative upper extremity was then prepped and draped in standard fashion. A time out was performed confirming the correct extremity, correct patient, and correct procedure.    I then created a standard posterior portal with an 11 blade. The glenohumeral joint was easily entered with a blunt trocar and the arthroscope introduced. The findings of diagnostic  arthroscopy are described above.  A  standard anterior portal was made.  I debrided degenerative tissue including the synovitic tissue about the rotator interval as well as the superior labrum and posterior labrum.  This was performed with an oscillating shaver. I then coagulated the inflamed synovium to obtain hemostasis and reduce the risk of post-operative swelling using an Arthrocare radiofrequency device.  Next, the anterior portal was enlarged and the cannula was removed.  An arthroscopic grasper was utilized to remove all large cartilaginous loose bodies from the joint.  Next, an oscillating shaver was then used to ensure small remnants were removed as well.  I then turned my attention to the arthroscopic biceps tenodesis. The Loop n Tack technique was used to pass a FiberTape through the biceps in a locked fashion adjacent to the biceps anchor.  A hole for a 2.9 mm Arthrex PushLock was drilled in the bicipital groove just superior to the subscapularis tendon insertion.  The biceps tendon was then cut and the biceps anchor complex was debrided down to a stable base on the superior labrum.  The FiberTape was loaded onto the PushLock anchor and impacted into place into the previously drilled hole in the bicipital groove.  This appropriately secured the biceps into the bicipital groove and took it off of tension.    The subscapularis tear was identified.  A superior anterolateral portal was made under needle localization.  A cannula was placed.  The comma tissue indicating the superolateral border of the subscapularis was identified readily.  The tip of the coracoid as well as the conjoined tendon and coracoacromial ligaments were visualized after debriding rotator interval tissue.  Tissue about the subscapularis was released anteriorly, superiorly, and posteriorly to allow for improved mobilization. The lesser tuberosity footprint was prepared with a combination of electrocautery and arthroscopic curette. A suture tape was passed in a mattress  fashion.  Both strands of suture were then loaded onto a 4.75 mm SwiveLock anchor and placed into the prepared footprint with the arm in a neutral position.  The knotless mechanism of the anchor was used to further reduce the upper border.  This construct appropriately reduced the subscapularis tear.  The arm was then internally and externally rotated and the subscapularis was noted to move appropriately with rotation.  The remainder of the suture was then cut.    Next, the arthroscope was then introduced into the subacromial space.  An extensive subacromial bursectomy and debridement was performed using a combination of the shaver and Arthrocare wand. The entire acromial undersurface was exposed and the CA ligament was subperiosteally elevated to expose the anterior acromial hook. A 5.90mm barrel burr was used to create a flat anterior and lateral aspect of the acromion, converting it from a Type 2 to a Type 1 acromion. Care was made to keep the deltoid fascia intact.   I then turned my attention to the arthroscopic distal clavicle excision. I identified the acromioclavicular joint. Surrounding bursal tissue was debrided and the edges of the joint were identified. I used the 5.48mm barrel burr to remove the distal clavicle parallel to the edge of the acromion. I was able to fit two widths of the burr into the space between the distal clavicle and acromion, signifying that I had removed ~17mm of distal clavicle. This was confirmed by viewing anteriorly and introducing a probe with measuring marks from the lateral portal. Hemostasis was achieved with an Arthrocare wand.   Next, I created an accessory posterolateral portal to assist  with visualization and instrumentation.  I debrided the poor quality edges of the supraspinatus tendon.  This was a tear at the musculotendinous junction at the level of the articular margin with ~50% of articular side footprint tearing.  There was reasonably good-quality remnant  tendon covering the footprint without retraction of the medial stump.  This could be easily mobilized to the footprint. I prepared the footprint using an ArthroCare wand to clear it of soft tissue.     I then percutaneously placed 1 AlphaVent anchor triple loaded with suture tape as a medial row anchor along the central portion of the tear at the articular margin. I then shuttled four strands of tape through the rotator cuff just lateral to the musculotendinous junction using a FirstPass suture passer spanning the anterior to posterior extent of the tear.  The other 2 strands of tape were passed in a side-to-side fashion.  First pass was used to pass through the medial stump and a Birdbeak was used to pass the other strand through the medial stump.  Next, the 4 strands of suture tape were passed through an [employer] anchor.  This was placed approximately 2 cm distal to the lateral edge of the footprint with appropriate tensioning of each suture prior to final fixation. The side-to-side sutures were then tied arthroscopically further reducing the tear.  This construct allowed for excellent reapproximation of the medial stump to the remnant lateral tendon at the level of the articular margin.  To further reinforce the repair, the knotless mechanism of the SwiveLock anchor was utilized to further reinforce the anterior portion of the tear.  This construct allowed for excellent reduction of the rotator cuff, and appropriate compression was achieved.  The repair was stable to external and internal rotation.   Fluid was evacuated from the shoulder, and the portals were closed with 3-0 Nylon.  Local anesthetic was injected subcutaneously.  Xeroform was applied to the portals. A sterile dressing was applied, followed by a Polar Care sleeve and a SlingShot shoulder immobilizer/sling. The patient was awakened from anesthesia without difficulty and was transferred to the PACU in stable condition.    Additionally,  this case had increased complexity compared to standard arthroscopic rotator cuff repair given the involvement of the subscapularis tear. Repair of this tear increased surgical time by 30 minutes and increased complexity due to additional preparation and repair of subscapularis tear, use of additional implants, and creation of an accessory portal, all of which would have otherwise not occurred.      COMPLICATIONS: none   DISPOSITION: plan for discharge home after recovery in PACU     POSTOPERATIVE PLAN: Remain in sling (except hygiene and elbow/wrist/hand RoM exercises as instructed by PT) x 6 weeks and NWB for this time. PT to begin 3-4 days after surgery.  Use large rotator cuff repair rehab protocol with subscapularis restrictions.  ASA 325mg  daily x 2 weeks for DVT ppx.

## 2024-08-15 NOTE — H&P (Signed)
 Paper H&P to be scanned into permanent record. H&P reviewed. No significant changes noted.

## 2024-08-15 NOTE — Transfer of Care (Signed)
 Immediate Anesthesia Transfer of Care Note  Patient: Sally Hughes  Procedure(s) Performed: REPAIR, ROTATOR CUFF, ARTHROSCOPIC (Left: Shoulder) DECOMPRESSION, SUBACROMIAL SPACE (Left: Shoulder) ARTHROSCOPY, SHOULDER WITH DEBRIDEMENT (Left: Shoulder)  Patient Location: PACU  Anesthesia Type: General ETT  Level of Consciousness: awake, alert  and patient cooperative  Airway and Oxygen Therapy: Patient Spontanous Breathing and Patient connected to supplemental oxygen  Post-op Assessment: Post-op Vital signs reviewed, Patient's Cardiovascular Status Stable, Respiratory Function Stable, Patent Airway and No signs of Nausea or vomiting  Post-op Vital Signs: Reviewed and stable  Complications: No notable events documented.

## 2024-08-15 NOTE — Anesthesia Procedure Notes (Signed)
 Procedure Name: Intubation Date/Time: 08/15/2024 12:43 PM  Performed by: Elly Pfeiffer, CRNAPre-anesthesia Checklist: Patient identified, Emergency Drugs available, Suction available and Patient being monitored Patient Re-evaluated:Patient Re-evaluated prior to induction Oxygen Delivery Method: Circle system utilized Preoxygenation: Pre-oxygenation with 100% oxygen Induction Type: IV induction Ventilation: Mask ventilation without difficulty Laryngoscope Size: McGrath and 4 Tube type: Oral Tube size: 7.0 mm Number of attempts: 1 Airway Equipment and Method: Stylet and Oral airway Placement Confirmation: ETT inserted through vocal cords under direct vision, positive ETCO2 and breath sounds checked- equal and bilateral Secured at: 22 cm Tube secured with: Tape Dental Injury: Teeth and Oropharynx as per pre-operative assessment  Comments: Cords clear; no trauma. CA

## 2024-08-22 ENCOUNTER — Encounter: Payer: Self-pay | Admitting: Orthopedic Surgery

## 2024-09-15 ENCOUNTER — Other Ambulatory Visit: Payer: Self-pay | Admitting: Internal Medicine

## 2024-09-15 DIAGNOSIS — R0602 Shortness of breath: Secondary | ICD-10-CM

## 2024-09-15 DIAGNOSIS — J849 Interstitial pulmonary disease, unspecified: Secondary | ICD-10-CM

## 2024-09-23 ENCOUNTER — Ambulatory Visit
Admission: RE | Admit: 2024-09-23 | Discharge: 2024-09-23 | Disposition: A | Discharge status: Home / Self Care | Source: Ambulatory Visit | Attending: Internal Medicine | Admitting: Internal Medicine

## 2024-09-23 DIAGNOSIS — R0602 Shortness of breath: Secondary | ICD-10-CM | POA: Diagnosis present

## 2024-09-23 DIAGNOSIS — J849 Interstitial pulmonary disease, unspecified: Secondary | ICD-10-CM | POA: Diagnosis present

## 2024-10-10 ENCOUNTER — Ambulatory Visit: Admitting: Psychiatry

## 2024-11-15 ENCOUNTER — Encounter: Payer: Self-pay | Admitting: Orthopedic Surgery

## 2024-12-02 ENCOUNTER — Other Ambulatory Visit: Payer: Self-pay | Admitting: Specialist

## 2024-12-02 DIAGNOSIS — R590 Localized enlarged lymph nodes: Secondary | ICD-10-CM

## 2024-12-14 ENCOUNTER — Ambulatory Visit
